# Patient Record
Sex: Female | Born: 1945
Health system: Southern US, Community
[De-identification: ages and names within clinical notes are randomized; demographics above are authoritative.]

## PROBLEM LIST (undated history)

## (undated) DIAGNOSIS — R413 Other amnesia: Secondary | ICD-10-CM

## (undated) DIAGNOSIS — E039 Hypothyroidism, unspecified: Secondary | ICD-10-CM

## (undated) DIAGNOSIS — F0390 Unspecified dementia without behavioral disturbance: Secondary | ICD-10-CM

## (undated) DIAGNOSIS — F419 Anxiety disorder, unspecified: Secondary | ICD-10-CM

## (undated) DIAGNOSIS — G8929 Other chronic pain: Secondary | ICD-10-CM

## (undated) DIAGNOSIS — L603 Nail dystrophy: Secondary | ICD-10-CM

## (undated) DIAGNOSIS — G479 Sleep disorder, unspecified: Secondary | ICD-10-CM

## (undated) HISTORY — DX: Hypothyroidism, unspecified: E03.9

## (undated) HISTORY — DX: Other chronic pain: G89.29

## (undated) HISTORY — DX: Other amnesia: R41.3

## (undated) HISTORY — DX: Nail dystrophy: L60.3

## (undated) HISTORY — DX: Sleep disorder, unspecified: G47.9

## (undated) HISTORY — PX: TONSILLECTOMY AND ADENOIDECTOMY: SUR1326

## (undated) HISTORY — DX: Unspecified dementia, unspecified severity, without behavioral disturbance, psychotic disturbance, mood disturbance, and anxiety: F03.90

## (undated) HISTORY — DX: Anxiety disorder, unspecified: F41.9

---

## 1998-12-18 ENCOUNTER — Other Ambulatory Visit: Admission: RE | Admit: 1998-12-18 | Discharge: 1998-12-18 | Payer: Self-pay | Admitting: Internal Medicine

## 1999-01-03 ENCOUNTER — Encounter: Payer: Self-pay | Admitting: Internal Medicine

## 1999-01-03 ENCOUNTER — Ambulatory Visit (HOSPITAL_COMMUNITY): Admission: RE | Admit: 1999-01-03 | Discharge: 1999-01-03 | Payer: Self-pay | Admitting: Internal Medicine

## 1999-12-19 ENCOUNTER — Other Ambulatory Visit: Admission: RE | Admit: 1999-12-19 | Discharge: 1999-12-19 | Payer: Self-pay | Admitting: Internal Medicine

## 2000-01-01 ENCOUNTER — Encounter: Payer: Self-pay | Admitting: Internal Medicine

## 2000-01-01 ENCOUNTER — Ambulatory Visit (HOSPITAL_COMMUNITY): Admission: RE | Admit: 2000-01-01 | Discharge: 2000-01-01 | Payer: Self-pay | Admitting: Internal Medicine

## 2000-01-10 ENCOUNTER — Encounter: Payer: Self-pay | Admitting: Internal Medicine

## 2000-01-10 ENCOUNTER — Ambulatory Visit (HOSPITAL_COMMUNITY): Admission: RE | Admit: 2000-01-10 | Discharge: 2000-01-10 | Payer: Self-pay | Admitting: Internal Medicine

## 2000-05-18 ENCOUNTER — Emergency Department (HOSPITAL_COMMUNITY): Admission: EM | Admit: 2000-05-18 | Discharge: 2000-05-19 | Payer: Self-pay

## 2000-07-16 ENCOUNTER — Other Ambulatory Visit: Admission: RE | Admit: 2000-07-16 | Discharge: 2000-07-16 | Payer: Self-pay | Admitting: Internal Medicine

## 2000-08-03 ENCOUNTER — Encounter: Payer: Self-pay | Admitting: Specialist

## 2000-08-03 ENCOUNTER — Encounter: Admission: RE | Admit: 2000-08-03 | Discharge: 2000-08-03 | Payer: Self-pay | Admitting: Specialist

## 2000-08-21 ENCOUNTER — Encounter (INDEPENDENT_AMBULATORY_CARE_PROVIDER_SITE_OTHER): Payer: Self-pay

## 2000-08-21 ENCOUNTER — Other Ambulatory Visit: Admission: RE | Admit: 2000-08-21 | Discharge: 2000-08-21 | Payer: Self-pay | Admitting: Obstetrics and Gynecology

## 2001-03-13 ENCOUNTER — Emergency Department (HOSPITAL_COMMUNITY): Admission: EM | Admit: 2001-03-13 | Discharge: 2001-03-14 | Payer: Self-pay | Admitting: Emergency Medicine

## 2001-03-15 ENCOUNTER — Emergency Department (HOSPITAL_COMMUNITY): Admission: EM | Admit: 2001-03-15 | Discharge: 2001-03-15 | Payer: Self-pay | Admitting: Emergency Medicine

## 2001-03-17 ENCOUNTER — Emergency Department (HOSPITAL_COMMUNITY): Admission: EM | Admit: 2001-03-17 | Discharge: 2001-03-17 | Payer: Self-pay | Admitting: Emergency Medicine

## 2003-03-02 ENCOUNTER — Emergency Department (HOSPITAL_COMMUNITY): Admission: EM | Admit: 2003-03-02 | Discharge: 2003-03-03 | Payer: Self-pay | Admitting: Emergency Medicine

## 2006-09-08 ENCOUNTER — Ambulatory Visit (HOSPITAL_COMMUNITY): Admission: RE | Admit: 2006-09-08 | Discharge: 2006-09-08 | Payer: Self-pay | Admitting: Internal Medicine

## 2008-02-24 ENCOUNTER — Encounter: Admission: RE | Admit: 2008-02-24 | Discharge: 2008-02-24 | Payer: Self-pay | Admitting: Neurology

## 2009-05-03 ENCOUNTER — Emergency Department (HOSPITAL_COMMUNITY): Admission: EM | Admit: 2009-05-03 | Discharge: 2009-05-03 | Payer: Self-pay | Admitting: Emergency Medicine

## 2010-04-08 ENCOUNTER — Encounter: Admission: RE | Admit: 2010-04-08 | Discharge: 2010-04-08 | Payer: Self-pay | Admitting: Chiropractic Medicine

## 2010-04-21 ENCOUNTER — Emergency Department (HOSPITAL_COMMUNITY): Admission: EM | Admit: 2010-04-21 | Discharge: 2010-04-21 | Payer: Self-pay | Admitting: Emergency Medicine

## 2010-12-29 LAB — DIFFERENTIAL
Basophils Absolute: 0.1 10*3/uL (ref 0.0–0.1)
Eosinophils Absolute: 0.3 10*3/uL (ref 0.0–0.7)
Eosinophils Relative: 2 % (ref 0–5)
Lymphocytes Relative: 13 % (ref 12–46)

## 2010-12-29 LAB — POCT I-STAT, CHEM 8
BUN: 13 mg/dL (ref 6–23)
Calcium, Ion: 1.12 mmol/L (ref 1.12–1.32)
Chloride: 104 mEq/L (ref 96–112)
Creatinine, Ser: 1.1 mg/dL (ref 0.4–1.2)
Glucose, Bld: 134 mg/dL — ABNORMAL HIGH (ref 70–99)
HCT: 41 % (ref 36.0–46.0)
Hemoglobin: 13.9 g/dL (ref 12.0–15.0)
TCO2: 27 mmol/L (ref 0–100)

## 2010-12-29 LAB — CBC
HCT: 38 % (ref 36.0–46.0)
MCHC: 34 g/dL (ref 30.0–36.0)
Platelets: 190 10*3/uL (ref 150–400)

## 2010-12-29 LAB — POCT CARDIAC MARKERS
CKMB, poc: 1.1 ng/mL (ref 1.0–8.0)
Myoglobin, poc: 125 ng/mL (ref 12–200)

## 2011-01-19 LAB — CBC
MCHC: 33.7 g/dL (ref 30.0–36.0)
Platelets: 165 10*3/uL (ref 150–400)
RBC: 3.92 MIL/uL (ref 3.87–5.11)
WBC: 6.8 10*3/uL (ref 4.0–10.5)

## 2011-01-19 LAB — POCT CARDIAC MARKERS
Myoglobin, poc: 47.8 ng/mL (ref 12–200)
Troponin i, poc: <0.05 ng/mL (ref 0.00–0.09)

## 2011-01-19 LAB — DIFFERENTIAL
Basophils Relative: 2 % — ABNORMAL HIGH (ref 0–1)
Eosinophils Relative: 4 % (ref 0–5)
Neutro Abs: 4.3 10*3/uL (ref 1.7–7.7)

## 2011-01-19 LAB — BASIC METABOLIC PANEL
CO2: 28 mEq/L (ref 19–32)
Creatinine, Ser: 0.58 mg/dL (ref 0.4–1.2)
GFR calc non Af Amer: >60 mL/min (ref >60)
Glucose, Bld: 81 mg/dL (ref 70–99)
Potassium: 3.8 mEq/L (ref 3.5–5.1)
Sodium: 139 mEq/L (ref 135–145)

## 2012-06-10 ENCOUNTER — Ambulatory Visit: Payer: BC Managed Care – PPO

## 2012-06-10 ENCOUNTER — Ambulatory Visit (INDEPENDENT_AMBULATORY_CARE_PROVIDER_SITE_OTHER): Payer: BC Managed Care – PPO | Admitting: Emergency Medicine

## 2012-06-10 VITALS — BP 138/84 | HR 64 | Temp 97.3°F | Resp 16 | Ht 65.0 in | Wt 184.0 lb

## 2012-06-10 DIAGNOSIS — R079 Chest pain, unspecified: Secondary | ICD-10-CM

## 2012-06-10 DIAGNOSIS — M79609 Pain in unspecified limb: Secondary | ICD-10-CM

## 2012-06-10 DIAGNOSIS — S8000XA Contusion of unspecified knee, initial encounter: Secondary | ICD-10-CM

## 2012-06-10 DIAGNOSIS — M79676 Pain in unspecified toe(s): Secondary | ICD-10-CM

## 2012-06-10 MED ORDER — HYDROCODONE-ACETAMINOPHEN 5-325 MG PO TABS: 1.0000 | ORAL_TABLET | ORAL | Status: AC | PRN

## 2012-06-10 NOTE — Progress Notes (Signed)
   Date:  06/10/2012   Name:  Darlene Ballard   DOB:  [birth year]   MRN:  [id number] Gender: female Age: 66 y.o.  PCP:  No primary provider on file.    Chief Complaint: Knee Pain, Toe Pain and Chest Pain   History of Present Illness:  Darlene Ballard is a 66 y.o. pleasant patient who presents with the following:  Patient was changing her cat litter box and turned around and slipped on a piece of newspaper on the floor.  She fell forward and landed on her right knee on the floor and her lower chest and upper abdomen landed against a metal frame.  She put on an abdominal binder and an ace bandage on her knee and went to work.  She now comes here after her work day with pain in her right knee and swelling and ecchymosis on her right anterior thigh and right knee.  She has pain in her right great toe.  Is experiencing pain in her lower chest wall inferiorly.  Some pleuritic component with pain on full inspiration.  There is no nausea, vomiting, shortness of breath.  No limitation of appetite.  No hemoptysis.  There is no problem list on file for this patient.   No past medical history on file.  No past surgical history on file.  History  Substance Use Topics  . Smoking status: Former Games developer  . Smokeless tobacco: Not on file  . Alcohol Use: Not on file    No family history on file.  No Known Allergies  Medication list has been reviewed and updated.  Current Outpatient Prescriptions on File Prior to Visit  Medication Sig Dispense Refill  . levothyroxine (SYNTHROID, LEVOTHROID) 100 MCG tablet Take 100 mcg by mouth daily.        Review of Systems:  As per HPI, otherwise negative.    Physical Examination: Filed Vitals:   06/10/12 1816  BP: 138/84  Pulse: 64  Temp: 97.3 F (36.3 C)  Resp: 16   Filed Vitals:   06/10/12 1816  Height: 5\' 5"  (1.651 m)  Weight: 184 lb (83.462 kg)   Body mass index is 30.62 kg/(m^2). Ideal Body Weight: Weight in (lb) to have BMI = 25: 149.9     GEN: WDWN, NAD, Non-toxic, A & O x 3 HEENT: Atraumatic, Normocephalic. Neck supple. No masses, No LAD. Ears and Nose: No external deformity. CV: RRR, No M/G/R. No JVD. No thrill. No extra heart sounds. PULM: CTA B, no wheezes, crackles, rhonchi. No retractions. No resp. distress. No accessory muscle use.  No crepitus or flail.  No ecchymosis ABD: S, ND, +BS. No rebound. No HSM.  No ecchymosis.  Little upper abdominal tenderness in midline EXTR: No c/c/e.  Marked tenderness right knee and moderate effusion and ecchymosis of knee.  Full passive ROM.  Right great toe tender and no deformity or ecchymosis. NEURO :  antalgit gait.  PSYCH: Normally interactive. Conversant. Not depressed or anxious appearing.  Calm demeanor.    Assessment and Plan: Chest contusion Contusion knee Ice and elevation vicodin Follow up as needed.  Carmelina Dane, MD

## 2018-08-01 NOTE — Progress Notes (Signed)
Subjective:    Patient ID: Darlene Ballard, female    DOB: 1946/04/09, 72 y.o.   MRN: [id number]  HPI:  [name] is here to establish as a new pt. She is a pleasant 72 year old female. PMH: She denies chronic medical conditions and has not been on rx's in years. However problem list reflects HLD, Hypothyroidism, Anxiety, and elevated BP She reports LLE edema the last two weeks and BP today 151/91 She denies CP/dyspnea/dizziness/HA/palpitations She estimates to drink >60 oz water/day, follows heart healthy diet She exercises at [facility] 4 times/week She abstains from ETOH/tobacco She reports extremely stressful home life r/t volatile marriage, highlights -been together >40 years -one daughter, whom lives locally "Darlene Ballard" -he is convicted felon, cocaine trafficking -he is absent long hours during daytime, will return at night to "scream at me" -he does not financially contribute to household expenses  She reports feeling safe at home She denies thoughts of harming herself/others  No care team member to display  Patient Active Problem List   Diagnosis Date Noted  . Healthcare maintenance 08/03/2018  . Anxiety 08/03/2018  . Hypertension 08/03/2018     History reviewed. No pertinent past medical history.   Past Surgical History:  Procedure Laterality Date  . TONSILLECTOMY AND ADENOIDECTOMY       Family History  Problem Relation Age of Onset  . Alzheimer's disease Mother   . Heart disease Mother   . Heart attack Mother      Social History   Substance and Sexual Activity  Drug Use Never     Social History   Substance and Sexual Activity  Alcohol Use Never  . Frequency: Never     Social History   Tobacco Use  Smoking Status Never Smoker  Smokeless Tobacco Never Used     Outpatient Encounter Medications as of 08/03/2018  Medication Sig  . Multiple Vitamin (MULTIVITAMIN) tablet Take 1 tablet by mouth daily.  . hydrochlorothiazide (HYDRODIURIL) 12.5 MG  tablet Take 1 tablet (12.5 mg total) by mouth daily.  Marland Kitchen Zoster Vaccine Adjuvanted [facility]) injection Inject 0.5 mLs into the muscle once for 1 dose.  . [DISCONTINUED] ALPRAZolam (XANAX) 0.5 MG tablet Take 0.5 mg by mouth 2 (two) times daily.  . [DISCONTINUED] levothyroxine (SYNTHROID, LEVOTHROID) 100 MCG tablet Take 100 mcg by mouth daily.   No facility-administered encounter medications on file as of 08/03/2018.     Allergies: Patient has no known allergies.  Body mass index is 29.95 kg/m.  Blood pressure (!) 151/91, pulse 81, height 5\' 5"  (1.651 m), weight 180 lb (81.6 kg), SpO2 99 %.  Review of Systems  Constitutional: Positive for fatigue. Negative for activity change, appetite change, chills, diaphoresis, fever and unexpected weight change.  HENT: Negative for congestion.   Eyes: Negative for visual disturbance.  Respiratory: Negative for cough, chest tightness, shortness of breath, wheezing and stridor.   Cardiovascular: Positive for leg swelling. Negative for chest pain and palpitations.  Gastrointestinal: Negative for abdominal distention, anal bleeding, blood in stool, constipation, diarrhea, nausea and vomiting.  Genitourinary: Negative for difficulty urinating and flank pain.  Musculoskeletal: Negative for arthralgias, back pain, gait problem, joint swelling, myalgias, neck pain and neck stiffness.  Skin: Negative for color change, pallor, rash and wound.  Neurological: Negative for dizziness and headaches.  Hematological: Does not bruise/bleed easily.  Psychiatric/Behavioral: Positive for dysphoric mood and sleep disturbance. Negative for behavioral problems, confusion, decreased concentration, hallucinations, self-injury and suicidal ideas. The patient is nervous/anxious. The patient is  not hyperactive.        Objective:   Physical Exam  Constitutional: She is oriented to person, place, and time. She appears well-developed and well-nourished. No distress.  HENT:   Head: Normocephalic and atraumatic.  Right Ear: External ear normal.  Left Ear: External ear normal.  Nose: Nose normal.  Mouth/Throat: Oropharynx is clear and moist.  Cardiovascular: Normal rate, regular rhythm, normal heart sounds and intact distal pulses.  No murmur heard. Pulmonary/Chest: Effort normal and breath sounds normal. No stridor. No respiratory distress. She has no wheezes. She has no rales. She exhibits no tenderness.  Musculoskeletal: She exhibits edema. She exhibits no tenderness.       Left ankle: She exhibits swelling. She exhibits normal range of motion, no ecchymosis and normal pulse.       Left foot: There is swelling. There is no tenderness and normal capillary refill.  Neurological: She is alert and oriented to person, place, and time.  Skin: Skin is warm and dry. Capillary refill takes less than 2 seconds. No rash noted. She is not diaphoretic. No erythema. No pallor.  Psychiatric: She has a normal mood and affect. Her behavior is normal. Judgment and thought content normal.  Nursing note and vitals reviewed.     Assessment & Plan:   1. Postmenopausal   2. Estrogen deficiency   3. Healthcare maintenance   4. Need for shingles vaccine   5. Screening for breast cancer   6. Hypertension, unspecified type   7. Anxiety     Healthcare maintenance 1) Please start once daily Hydrochlorothiazide 12.5mg  to treat lower extremity and elevated blood pressure. 2) Please check blood pressure and heart rate several times a week and bring log with you to follow-up in 2 weeks. 3) At two week follow-up, please also be fasting. 4) Behavioral Health referral placed, someone will call you to make an appt. 5) If you ever feel unsafe at home, call 911 6) If you would like dissolve your marriage, strongly urge you to contact local attorney. 7) Recommend complete physical this fall.  Anxiety CBT referral placed  Hypertension BP 151/91, HR 81 LLE edema CMP drawn Started on  HCTZ 12.5mg  QD Check BP/HR daily, bring log to f/u in 2 weeks   Pt was in the office today for 45+ minutes, I spent >75% of time in face to face counseling of various medical concerns and in coordination of care FOLLOW-UP:  Return in about 2 weeks (around 08/17/2018) for HTN, Evaluate Medication Effectiveness.

## 2018-08-03 ENCOUNTER — Encounter: Payer: Self-pay | Admitting: Adult Health

## 2018-08-03 ENCOUNTER — Ambulatory Visit (INDEPENDENT_AMBULATORY_CARE_PROVIDER_SITE_OTHER): Payer: Medicare HMO | Admitting: Adult Health

## 2018-08-03 VITALS — BP 151/91 | HR 81 | Ht 65.0 in | Wt 180.0 lb

## 2018-08-03 DIAGNOSIS — E2839 Other primary ovarian failure: Secondary | ICD-10-CM | POA: Diagnosis not present

## 2018-08-03 DIAGNOSIS — I10 Essential (primary) hypertension: Secondary | ICD-10-CM | POA: Insufficient documentation

## 2018-08-03 DIAGNOSIS — R69 Illness, unspecified: Secondary | ICD-10-CM | POA: Diagnosis not present

## 2018-08-03 DIAGNOSIS — Z0000 Encounter for general adult medical examination without abnormal findings: Secondary | ICD-10-CM | POA: Insufficient documentation

## 2018-08-03 DIAGNOSIS — Z1239 Encounter for other screening for malignant neoplasm of breast: Secondary | ICD-10-CM

## 2018-08-03 DIAGNOSIS — Z780 Asymptomatic menopausal state: Secondary | ICD-10-CM

## 2018-08-03 DIAGNOSIS — Z23 Encounter for immunization: Secondary | ICD-10-CM | POA: Diagnosis not present

## 2018-08-03 DIAGNOSIS — F419 Anxiety disorder, unspecified: Secondary | ICD-10-CM

## 2018-08-03 MED ORDER — ZOSTER VAC RECOMB ADJUVANTED 50 MCG/0.5ML IM SUSR: 0.5000 mL | Freq: Once | INTRAMUSCULAR | 0 refills | Status: AC

## 2018-08-03 MED ORDER — HYDROCHLOROTHIAZIDE 12.5 MG PO TABS: 12.5000 mg | ORAL_TABLET | Freq: Every day | ORAL | 0 refills | Status: DC

## 2018-08-03 NOTE — Patient Instructions (Addendum)
Hypertension Hypertension, commonly called high blood pressure, is when the force of blood pumping through the arteries is too strong. The arteries are the blood vessels that carry blood from the heart throughout the body. Hypertension forces the heart to work harder to pump blood and may cause arteries to become narrow or stiff. Having untreated or uncontrolled hypertension can cause heart attacks, strokes, kidney disease, and other problems. A blood pressure reading consists of a higher number over a lower number. Ideally, your blood pressure should be below 120/80. The first ("top") number is called the systolic pressure. It is a measure of the pressure in your arteries as your heart beats. The second ("bottom") number is called the diastolic pressure. It is a measure of the pressure in your arteries as the heart relaxes. What are the causes? The cause of this condition is not known. What increases the risk? Some risk factors for high blood pressure are under your control. Others are not. Factors you can change  Smoking.  Having type 2 diabetes mellitus, high cholesterol, or both.  Not getting enough exercise or physical activity.  Being overweight.  Having too much fat, sugar, calories, or salt (sodium) in your diet.  Drinking too much alcohol. Factors that are difficult or impossible to change  Having chronic kidney disease.  Having a family history of high blood pressure.  Age. Risk increases with age.  Race. You may be at higher risk if you are African-American.  Gender. Men are at higher risk than women before age 45. After age 65, women are at higher risk than men.  Having obstructive sleep apnea.  Stress. What are the signs or symptoms? Extremely high blood pressure (hypertensive crisis) may cause:  Headache.  Anxiety.  Shortness of breath.  Nosebleed.  Nausea and vomiting.  Severe chest pain.  Jerky movements you cannot control (seizures).  How is this  diagnosed? This condition is diagnosed by measuring your blood pressure while you are seated, with your arm resting on a surface. The cuff of the blood pressure monitor will be placed directly against the skin of your upper arm at the level of your heart. It should be measured at least twice using the same arm. Certain conditions can cause a difference in blood pressure between your right and left arms. Certain factors can cause blood pressure readings to be lower or higher than normal (elevated) for a short period of time:  When your blood pressure is higher when you are in a health care provider's office than when you are at home, this is called white coat hypertension. Most people with this condition do not need medicines.  When your blood pressure is higher at home than when you are in a health care provider's office, this is called masked hypertension. Most people with this condition may need medicines to control blood pressure.  If you have a high blood pressure reading during one visit or you have normal blood pressure with other risk factors:  You may be asked to return on a different day to have your blood pressure checked again.  You may be asked to monitor your blood pressure at home for 1 week or longer.  If you are diagnosed with hypertension, you may have other blood or imaging tests to help your health care provider understand your overall risk for other conditions. How is this treated? This condition is treated by making healthy lifestyle changes, such as eating healthy foods, exercising more, and reducing your alcohol intake. Your   health care provider may prescribe medicine if lifestyle changes are not enough to get your blood pressure under control, and if:  Your systolic blood pressure is above 130.  Your diastolic blood pressure is above 80.  Your personal target blood pressure may vary depending on your medical conditions, your age, and other factors. Follow these  instructions at home: Eating and drinking  Eat a diet that is high in fiber and potassium, and low in sodium, added sugar, and fat. An example eating plan is called the DASH (Dietary Approaches to Stop Hypertension) diet. To eat this way: ? Eat plenty of fresh fruits and vegetables. Try to fill half of your plate at each meal with fruits and vegetables. ? Eat whole grains, such as whole wheat pasta, brown rice, or whole grain bread. Fill about one quarter of your plate with whole grains. ? Eat or drink low-fat dairy products, such as skim milk or low-fat yogurt. ? Avoid fatty cuts of meat, processed or cured meats, and poultry with skin. Fill about one quarter of your plate with lean proteins, such as fish, chicken without skin, beans, eggs, and tofu. ? Avoid premade and processed foods. These tend to be higher in sodium, added sugar, and fat.  Reduce your daily sodium intake. Most people with hypertension should eat less than 1,500 mg of sodium a day.  Limit alcohol intake to no more than 1 drink a day for nonpregnant women and 2 drinks a day for men. One drink equals 12 oz of beer, 5 oz of wine, or 1 oz of hard liquor. Lifestyle  Work with your health care provider to maintain a healthy body weight or to lose weight. Ask what an ideal weight is for you.  Get at least 30 minutes of exercise that causes your heart to beat faster (aerobic exercise) most days of the week. Activities may include walking, swimming, or biking.  Include exercise to strengthen your muscles (resistance exercise), such as pilates or lifting weights, as part of your weekly exercise routine. Try to do these types of exercises for 30 minutes at least 3 days a week.  Do not use any products that contain nicotine or tobacco, such as cigarettes and e-cigarettes. If you need help quitting, ask your health care provider.  Monitor your blood pressure at home as told by your health care provider.  Keep all follow-up visits as  told by your health care provider. This is important. Medicines  Take over-the-counter and prescription medicines only as told by your health care provider. Follow directions carefully. Blood pressure medicines must be taken as prescribed.  Do not skip doses of blood pressure medicine. Doing this puts you at risk for problems and can make the medicine less effective.  Ask your health care provider about side effects or reactions to medicines that you should watch for. Contact a health care provider if:  You think you are having a reaction to a medicine you are taking.  You have headaches that keep coming back (recurring).  You feel dizzy.  You have swelling in your ankles.  You have trouble with your vision. Get help right away if:  You develop a severe headache or confusion.  You have unusual weakness or numbness.  You feel faint.  You have severe pain in your chest or abdomen.  You vomit repeatedly.  You have trouble breathing. Summary  Hypertension is when the force of blood pumping through your arteries is too strong. If this condition is not   controlled, it may put you at risk for serious complications.  Your personal target blood pressure may vary depending on your medical conditions, your age, and other factors. For most people, a normal blood pressure is less than 120/80.  Hypertension is treated with lifestyle changes, medicines, or a combination of both. Lifestyle changes include weight loss, eating a healthy, low-sodium diet, exercising more, and limiting alcohol. This information is not intended to replace advice given to you by your health care provider. Make sure you discuss any questions you have with your health care provider. Document Released: 09/29/2005 Document Revised: 08/27/2016 Document Reviewed: 08/27/2016 Elsevier Interactive Patient Education  2018 [place] refers to food and lifestyle choices that  are based on the traditions of countries located on the [employer]. This way of eating has been shown to help prevent certain conditions and improve outcomes for people who have chronic diseases, like kidney disease and heart disease. What are tips for following this plan? Lifestyle  Cook and eat meals together with your family, when possible.  Drink enough fluid to keep your urine clear or pale yellow.  Be physically active every day. This includes: ? Aerobic exercise like running or swimming. ? Leisure activities like gardening, walking, or housework.  Get 7-8 hours of sleep each night.  If recommended by your health care provider, drink red wine in moderation. This means 1 glass a day for nonpregnant women and 2 glasses a day for men. A glass of wine equals 5 oz (150 mL). Reading food labels  Check the serving size of packaged foods. For foods such as rice and pasta, the serving size refers to the amount of cooked product, not dry.  Check the total fat in packaged foods. Avoid foods that have saturated fat or trans fats.  Check the ingredients list for added sugars, such as corn syrup. Shopping  At the grocery store, buy most of your food from the areas near the walls of the store. This includes: ? Fresh fruits and vegetables (produce). ? Grains, beans, nuts, and seeds. Some of these may be available in unpackaged forms or large amounts (in bulk). ? Fresh seafood. ? Poultry and eggs. ? Low-fat dairy products.  Buy whole ingredients instead of prepackaged foods.  Buy fresh fruits and vegetables in-season from local farmers markets.  Buy frozen fruits and vegetables in resealable bags.  If you do not have access to quality fresh seafood, buy precooked frozen shrimp or canned fish, such as tuna, salmon, or sardines.  Buy small amounts of raw or cooked vegetables, salads, or olives from the deli or salad bar at your store.  Stock your pantry so you always have certain  foods on hand, such as olive oil, canned tuna, canned tomatoes, rice, pasta, and beans. Cooking  Cook foods with extra-virgin olive oil instead of using butter or other vegetable oils.  Have meat as a side dish, and have vegetables or grains as your main dish. This means having meat in small portions or adding small amounts of meat to foods like pasta or stew.  Use beans or vegetables instead of meat in common dishes like chili or lasagna.  Experiment with different cooking methods. Try roasting or broiling vegetables instead of steaming or sauteing them.  Add frozen vegetables to soups, stews, pasta, or rice.  Add nuts or seeds for added healthy fat at each meal. You can add these to yogurt, salads, or vegetable dishes.  Marinate  fish or vegetables using olive oil, lemon juice, garlic, and fresh herbs. Meal planning  Plan to eat 1 vegetarian meal one day each week. Try to work up to 2 vegetarian meals, if possible.  Eat seafood 2 or more times a week.  Have healthy snacks readily available, such as: ? Vegetable sticks with hummus. ? [place] yogurt. ? Fruit and nut trail mix.  Eat balanced meals throughout the week. This includes: ? Fruit: 2-3 servings a day ? Vegetables: 4-5 servings a day ? Low-fat dairy: 2 servings a day ? Fish, poultry, or lean meat: 1 serving a day ? Beans and legumes: 2 or more servings a week ? Nuts and seeds: 1-2 servings a day ? Whole grains: 6-8 servings a day ? Extra-virgin olive oil: 3-4 servings a day  Limit red meat and sweets to only a few servings a month What are my food choices?  Mediterranean diet ? Recommended ? Grains: Whole-grain pasta. Brown rice. Bulgar wheat. Polenta. Couscous. Whole-wheat bread. [place]. ? Vegetables: Artichokes. Beets. Broccoli. Cabbage. Carrots. Eggplant. Green beans. Chard. Kale. Spinach. Onions. Leeks. Peas. Squash. Tomatoes. Peppers. Radishes. ? Fruits: Apples. Apricots. Avocado. Berries. Bananas.  [place]. Dates. Figs. Grapes. Lemons. Melon. Oranges. Peaches. Plums. Pomegranate. ? Meats and other protein foods: Beans. Almonds. Sunflower seeds. Pine nuts. Peanuts. [place]. Salmon. Scallops. Shrimp. [place]. Tilapia. Clams. Oysters. Eggs. ? Dairy: Low-fat milk. Cheese. Greek yogurt. ? Beverages: Water. Red wine. Herbal tea. ? Fats and oils: Extra virgin olive oil. Avocado oil. Grape seed oil. ? Sweets and desserts: [place] yogurt with honey. Baked apples. Poached pears. Trail mix. ? Seasoning and other foods: Basil. Cilantro. Coriander. Cumin. Mint. Parsley. Sage. Rosemary. Tarragon. Garlic. Oregano. Thyme. Pepper. Balsalmic vinegar. Tahini. Hummus. Tomato sauce. Olives. Mushrooms. ? Limit these ? Grains: Prepackaged pasta or rice dishes. Prepackaged cereal with added sugar. ? Vegetables: Deep fried potatoes (french fries). ? Fruits: Fruit canned in syrup. ? Meats and other protein foods: Beef. Pork. Lamb. Poultry with skin. Hot dogs. [place] Salines. ? Dairy: Ice cream. Sour cream. Whole milk. ? Beverages: Juice. Sugar-sweetened soft drinks. Beer. Liquor and spirits. ? Fats and oils: Butter. Canola oil. Vegetable oil. Beef fat (tallow). Lard. ? Sweets and desserts: Cookies. Cakes. Pies. Candy. ? Seasoning and other foods: Mayonnaise. Premade sauces and marinades. ? The items listed may not be a complete list. Talk with your dietitian about what dietary choices are right for you. Summary  The Mediterranean diet includes both food and lifestyle choices.  Eat a variety of fresh fruits and vegetables, beans, nuts, seeds, and whole grains.  Limit the amount of red meat and sweets that you eat.  Talk with your health care provider about whether it is safe for you to drink red wine in moderation. This means 1 glass a day for nonpregnant women and 2 glasses a day for men. A glass of wine equals 5 oz (150 mL). This information is not intended to replace advice given to you by your health care provider. Make  sure you discuss any questions you have with your health care provider. Document Released: 05/22/2016 Document Revised: 06/24/2016 Document Reviewed: 05/22/2016 Elsevier Interactive Patient Education  2018 [employer].  1) Please start once daily Hydrochlorothiazide 12.5mg  to treat lower extremity and elevated blood pressure. 2) Please check blood pressure and heart rate several times a week and bring log with you to follow-up in 2 weeks. 3) At two week follow-up, please also be fasting. 4) Behavioral Health referral placed, someone will call  you to make an appt. 5) If you ever feel unsafe at home, call 911 6) If you would like dissolve your marriage, strongly urge you to contact local attorney. 7) Recommend complete physical this fall. WELCOME TO THE PRACTICE!

## 2018-08-03 NOTE — Assessment & Plan Note (Signed)
CBT referral placed. 

## 2018-08-03 NOTE — Assessment & Plan Note (Signed)
BP 151/91, HR 81 LLE edema CMP drawn Started on HCTZ 12.5mg  QD Check BP/HR daily, bring log to f/u in 2 weeks

## 2018-08-03 NOTE — Assessment & Plan Note (Signed)
1) Please start once daily Hydrochlorothiazide 12.5mg  to treat lower extremity and elevated blood pressure. 2) Please check blood pressure and heart rate several times a week and bring log with you to follow-up in 2 weeks. 3) At two week follow-up, please also be fasting. 4) Behavioral Health referral placed, someone will call you to make an appt. 5) If you ever feel unsafe at home, call 911 6) If you would like dissolve your marriage, strongly urge you to contact local attorney. 7) Recommend complete physical this fall.

## 2018-08-04 LAB — COMPREHENSIVE METABOLIC PANEL
A/G RATIO: 2 (ref 1.2–2.2)
ALT: 18 IU/L (ref 0–32)
AST: 24 IU/L (ref 0–40)
Albumin: 4.6 g/dL (ref 3.5–4.8)
Alkaline Phosphatase: 103 IU/L (ref 39–117)
BUN/Creatinine Ratio: 13 (ref 12–28)
BUN: 11 mg/dL (ref 8–27)
Bilirubin Total: 0.3 mg/dL (ref 0.0–1.2)
CALCIUM: 9.5 mg/dL (ref 8.7–10.3)
CO2: 24 mmol/L (ref 20–29)
Chloride: 102 mmol/L (ref 96–106)
Creatinine, Ser: 0.83 mg/dL (ref 0.57–1.00)
GFR, EST AFRICAN AMERICAN: 83 mL/min/{1.73_m2} (ref >59)
GFR, EST NON AFRICAN AMERICAN: 72 mL/min/{1.73_m2} (ref >59)
GLOBULIN, TOTAL: 2.3 g/dL (ref 1.5–4.5)
Glucose: 79 mg/dL (ref 65–99)
POTASSIUM: 3.6 mmol/L (ref 3.5–5.2)
SODIUM: 143 mmol/L (ref 134–144)
TOTAL PROTEIN: 6.9 g/dL (ref 6.0–8.5)

## 2018-08-17 NOTE — Progress Notes (Signed)
Subjective:    Patient ID: Darlene Ballard, female    DOB: [birth year], 72 y.o.   MRN: [id number]  HPI: 08/03/18 OV:  Darlene Ballard is here to establish as a new pt. She is a pleasant 72 year old female. PMH: She denies chronic medical conditions and has not been on rx's in years. However problem list reflects HLD, Hypothyroidism, Anxiety, and elevated BP She reports LLE edema the last two weeks and BP today 151/91 She denies CP/dyspnea/dizziness/HA/palpitations She estimates to drink >60 oz water/day, follows heart healthy diet She exercises at [facility] 4 times/week She abstains from ETOH/tobacco She reports extremely stressful home life r/t volatile marriage, highlights -been together >40 years -one daughter, whom lives locally "Darlene Ballard" -he is convicted felon, cocaine trafficking -he is absent long hours during daytime, will return at night to "scream at me" -he does not financially contribute to household expenses  She reports feeling safe at home She denies thoughts of harming herself/others  08/18/18 OV: Darlene Ballard is here for f/u:htn and edema  She reports taking only 1 tablet of HCTZ 12.5mg  and states "the pharmacy only gave me one tablet". Pharmacy called and confirmed that 30 count rx was filled and provided to pt Another rx sent in, pt instructed to take once daily and return in 2 weeks to evaluate medication effectiveness She continues to drink plenty of water and follow heart healthy diet I am concerned with her memory/recall, she denies known neurological disorders, however she did report her mother had dementia and alzheimer's She did not return fasting this morning, will obtain labs at next f/u OV   Patient Care Team    Relationship Specialty Notifications Start End  Esaw Grandchild, NP PCP - General Family Medicine  08/03/18     Patient Active Problem List   Diagnosis Date Noted  . Memory deficit 08/18/2018  . Healthcare maintenance 08/03/2018  . Anxiety 08/03/2018  .  Hypertension 08/03/2018     History reviewed. No pertinent past medical history.   Past Surgical History:  Procedure Laterality Date  . TONSILLECTOMY AND ADENOIDECTOMY       Family History  Problem Relation Age of Onset  . Alzheimer's disease Mother   . Heart disease Mother   . Heart attack Mother      Social History   Substance and Sexual Activity  Drug Use Never     Social History   Substance and Sexual Activity  Alcohol Use Never  . Frequency: Never     Social History   Tobacco Use  Smoking Status Never Smoker  Smokeless Tobacco Never Used     Outpatient Encounter Medications as of 08/18/2018  Medication Sig  . Multiple Vitamin (MULTIVITAMIN) tablet Take 1 tablet by mouth daily.  . hydrochlorothiazide (HYDRODIURIL) 12.5 MG tablet Take 1 tablet (12.5 mg total) by mouth daily.  . [DISCONTINUED] hydrochlorothiazide (HYDRODIURIL) 12.5 MG tablet Take 1 tablet (12.5 mg total) by mouth daily.   No facility-administered encounter medications on file as of 08/18/2018.     Allergies: Patient has no known allergies.  Body mass index is 30.39 kg/m.  Blood pressure (!) 159/92, pulse 73, temperature 98 F (36.7 C), temperature source Oral, height 5\' 5"  (1.651 m), weight 182 lb 9.6 oz (82.8 kg), SpO2 99 %.  Review of Systems  Constitutional: Positive for fatigue. Negative for activity change, appetite change, chills, diaphoresis, fever and unexpected weight change.  HENT: Negative for congestion.   Eyes: Negative for visual disturbance.  Respiratory: Negative  for cough, chest tightness, shortness of breath, wheezing and stridor.   Cardiovascular: Positive for leg swelling. Negative for chest pain and palpitations.  Gastrointestinal: Negative for abdominal distention, anal bleeding, blood in stool, constipation, diarrhea, nausea and vomiting.  Genitourinary: Negative for difficulty urinating and flank pain.  Musculoskeletal: Negative for arthralgias, back pain,  gait problem, joint swelling, myalgias, neck pain and neck stiffness.  Skin: Negative for color change, pallor, rash and wound.  Neurological: Negative for dizziness and headaches.  Hematological: Does not bruise/bleed easily.  Psychiatric/Behavioral: Positive for dysphoric mood and sleep disturbance. Negative for behavioral problems, confusion, decreased concentration, hallucinations, self-injury and suicidal ideas. The patient is nervous/anxious. The patient is not hyperactive.        Objective:   Physical Exam  Constitutional: She is oriented to person, place, and time. She appears well-developed and well-nourished. No distress.  HENT:  Head: Normocephalic and atraumatic.  Right Ear: External ear normal.  Left Ear: External ear normal.  Nose: Nose normal.  Mouth/Throat: Oropharynx is clear and moist.  Cardiovascular: Normal rate, regular rhythm, normal heart sounds and intact distal pulses.  No murmur heard. Pulmonary/Chest: Effort normal and breath sounds normal. No stridor. No respiratory distress. She has no wheezes. She has no rales. She exhibits no tenderness.  Musculoskeletal: She exhibits edema. She exhibits no tenderness.       Left ankle: She exhibits swelling. She exhibits normal range of motion, no ecchymosis and normal pulse.       Left foot: There is swelling. There is no tenderness and normal capillary refill.  Neurological: She is alert and oriented to person, place, and time.  Skin: Skin is warm and dry. Capillary refill takes less than 2 seconds. No rash noted. She is not diaphoretic. No erythema. No pallor.  Psychiatric: She has a normal mood and affect. Her behavior is normal. Judgment and thought content normal.  Nursing note and vitals reviewed.     Assessment & Plan:   1. Healthcare maintenance   2. Hypertension, unspecified type   3. Anxiety   4. Memory deficit     Healthcare maintenance Another prescription sent in for Hydrochlorothiazide 12.5mg  has  been sent in, please take on tablet daily. Please return in 2 weeks to assess medication effectiveness, blood pressure, and lower extremity edema. Come fasting to next appt. Continue to drink plenty and follow heart healthy diet. 09/01/18 you have behavioral health appt already scheduled, please keep!  Hypertension She has not been on HCTZ, new rx sent in BP 159/92, HR 73 Lower ext edema still present  Anxiety  09/01/18 you have behavioral health appt already scheduled, please keep!  Memory deficit Will perform MMSE at next OV She reports mother had dementia and alzheimer's   Pt was in the office today for 25+ minutes, I spent >75% of time in face to face counseling of various medical concerns and in coordination of care FOLLOW-UP:  Return in about 2 weeks (around 09/01/2018) for Regular Follow Up, HTN.

## 2018-08-18 ENCOUNTER — Ambulatory Visit (INDEPENDENT_AMBULATORY_CARE_PROVIDER_SITE_OTHER): Payer: Medicare HMO | Admitting: Adult Health

## 2018-08-18 ENCOUNTER — Encounter: Payer: Self-pay | Admitting: Adult Health

## 2018-08-18 VITALS — BP 131/79 | HR 73 | Temp 98.0°F | Ht 65.0 in | Wt 182.6 lb

## 2018-08-18 DIAGNOSIS — I10 Essential (primary) hypertension: Secondary | ICD-10-CM

## 2018-08-18 DIAGNOSIS — R413 Other amnesia: Secondary | ICD-10-CM | POA: Diagnosis not present

## 2018-08-18 DIAGNOSIS — R69 Illness, unspecified: Secondary | ICD-10-CM | POA: Diagnosis not present

## 2018-08-18 DIAGNOSIS — Z0000 Encounter for general adult medical examination without abnormal findings: Secondary | ICD-10-CM | POA: Diagnosis not present

## 2018-08-18 DIAGNOSIS — F419 Anxiety disorder, unspecified: Secondary | ICD-10-CM

## 2018-08-18 MED ORDER — HYDROCHLOROTHIAZIDE 12.5 MG PO TABS: 12.5000 mg | ORAL_TABLET | Freq: Every day | ORAL | 0 refills | Status: DC

## 2018-08-18 NOTE — Patient Instructions (Addendum)
Hypertension Hypertension, commonly called high blood pressure, is when the force of blood pumping through the arteries is too strong. The arteries are the blood vessels that carry blood from the heart throughout the body. Hypertension forces the heart to work harder to pump blood and may cause arteries to become narrow or stiff. Having untreated or uncontrolled hypertension can cause heart attacks, strokes, kidney disease, and other problems. A blood pressure reading consists of a higher number over a lower number. Ideally, your blood pressure should be below 120/80. The first ("top") number is called the systolic pressure. It is a measure of the pressure in your arteries as your heart beats. The second ("bottom") number is called the diastolic pressure. It is a measure of the pressure in your arteries as the heart relaxes. What are the causes? The cause of this condition is not known. What increases the risk? Some risk factors for high blood pressure are under your control. Others are not. Factors you can change  Smoking.  Having type 2 diabetes mellitus, high cholesterol, or both.  Not getting enough exercise or physical activity.  Being overweight.  Having too much fat, sugar, calories, or salt (sodium) in your diet.  Drinking too much alcohol. Factors that are difficult or impossible to change  Having chronic kidney disease.  Having a family history of high blood pressure.  Age. Risk increases with age.  Race. You may be at higher risk if you are African-American.  Gender. Men are at higher risk than women before age 45. After age 65, women are at higher risk than men.  Having obstructive sleep apnea.  Stress. What are the signs or symptoms? Extremely high blood pressure (hypertensive crisis) may cause:  Headache.  Anxiety.  Shortness of breath.  Nosebleed.  Nausea and vomiting.  Severe chest pain.  Jerky movements you cannot control (seizures).  How is this  diagnosed? This condition is diagnosed by measuring your blood pressure while you are seated, with your arm resting on a surface. The cuff of the blood pressure monitor will be placed directly against the skin of your upper arm at the level of your heart. It should be measured at least twice using the same arm. Certain conditions can cause a difference in blood pressure between your right and left arms. Certain factors can cause blood pressure readings to be lower or higher than normal (elevated) for a short period of time:  When your blood pressure is higher when you are in a health care provider's office than when you are at home, this is called white coat hypertension. Most people with this condition do not need medicines.  When your blood pressure is higher at home than when you are in a health care provider's office, this is called masked hypertension. Most people with this condition may need medicines to control blood pressure.  If you have a high blood pressure reading during one visit or you have normal blood pressure with other risk factors:  You may be asked to return on a different day to have your blood pressure checked again.  You may be asked to monitor your blood pressure at home for 1 week or longer.  If you are diagnosed with hypertension, you may have other blood or imaging tests to help your health care provider understand your overall risk for other conditions. How is this treated? This condition is treated by making healthy lifestyle changes, such as eating healthy foods, exercising more, and reducing your alcohol intake. Your   health care provider may prescribe medicine if lifestyle changes are not enough to get your blood pressure under control, and if:  Your systolic blood pressure is above 130.  Your diastolic blood pressure is above 80.  Your personal target blood pressure may vary depending on your medical conditions, your age, and other factors. Follow these  instructions at home: Eating and drinking  Eat a diet that is high in fiber and potassium, and low in sodium, added sugar, and fat. An example eating plan is called the DASH (Dietary Approaches to Stop Hypertension) diet. To eat this way: ? Eat plenty of fresh fruits and vegetables. Try to fill half of your plate at each meal with fruits and vegetables. ? Eat whole grains, such as whole wheat pasta, brown rice, or whole grain bread. Fill about one quarter of your plate with whole grains. ? Eat or drink low-fat dairy products, such as skim milk or low-fat yogurt. ? Avoid fatty cuts of meat, processed or cured meats, and poultry with skin. Fill about one quarter of your plate with lean proteins, such as fish, chicken without skin, beans, eggs, and tofu. ? Avoid premade and processed foods. These tend to be higher in sodium, added sugar, and fat.  Reduce your daily sodium intake. Most people with hypertension should eat less than 1,500 mg of sodium a day.  Limit alcohol intake to no more than 1 drink a day for nonpregnant women and 2 drinks a day for men. One drink equals 12 oz of beer, 5 oz of wine, or 1 oz of hard liquor. Lifestyle  Work with your health care provider to maintain a healthy body weight or to lose weight. Ask what an ideal weight is for you.  Get at least 30 minutes of exercise that causes your heart to beat faster (aerobic exercise) most days of the week. Activities may include walking, swimming, or biking.  Include exercise to strengthen your muscles (resistance exercise), such as pilates or lifting weights, as part of your weekly exercise routine. Try to do these types of exercises for 30 minutes at least 3 days a week.  Do not use any products that contain nicotine or tobacco, such as cigarettes and e-cigarettes. If you need help quitting, ask your health care provider.  Monitor your blood pressure at home as told by your health care provider.  Keep all follow-up visits as  told by your health care provider. This is important. Medicines  Take over-the-counter and prescription medicines only as told by your health care provider. Follow directions carefully. Blood pressure medicines must be taken as prescribed.  Do not skip doses of blood pressure medicine. Doing this puts you at risk for problems and can make the medicine less effective.  Ask your health care provider about side effects or reactions to medicines that you should watch for. Contact a health care provider if:  You think you are having a reaction to a medicine you are taking.  You have headaches that keep coming back (recurring).  You feel dizzy.  You have swelling in your ankles.  You have trouble with your vision. Get help right away if:  You develop a severe headache or confusion.  You have unusual weakness or numbness.  You feel faint.  You have severe pain in your chest or abdomen.  You vomit repeatedly.  You have trouble breathing. Summary  Hypertension is when the force of blood pumping through your arteries is too strong. If this condition is not   controlled, it may put you at risk for serious complications.  Your personal target blood pressure may vary depending on your medical conditions, your age, and other factors. For most people, a normal blood pressure is less than 120/80.  Hypertension is treated with lifestyle changes, medicines, or a combination of both. Lifestyle changes include weight loss, eating a healthy, low-sodium diet, exercising more, and limiting alcohol. This information is not intended to replace advice given to you by your health care provider. Make sure you discuss any questions you have with your health care provider. Document Released: 09/29/2005 Document Revised: 08/27/2016 Document Reviewed: 08/27/2016 Elsevier Interactive Patient Education  2018 [employer].   Edema Edema is an abnormal buildup of fluids in your bodytissues. Edema is  somewhatdependent on gravity to pull the fluid to the lowest place in your body. That makes the condition more common in the legs and thighs (lower extremities). Painless swelling of the feet and ankles is common and becomes more likely as you get older. It is also common in looser tissues, like around your eyes. When the affected area is squeezed, the fluid may move out of that spot and leave a dent for a few moments. This dent is called pitting. What are the causes? There are many possible causes of edema. Eating too much salt and being on your feet or sitting for a long time can cause edema in your legs and ankles. Hot weather may make edema worse. Common medical causes of edema include:  Heart failure.  Liver disease.  Kidney disease.  Weak blood vessels in your legs.  Cancer.  An injury.  Pregnancy.  Some medications.  Obesity.  What are the signs or symptoms? Edema is usually painless.Your skin may look swollen or shiny. How is this diagnosed? Your health care provider may be able to diagnose edema by asking about your medical history and doing a physical exam. You may need to have tests such as X-rays, an electrocardiogram, or blood tests to check for medical conditions that may cause edema. How is this treated? Edema treatment depends on the cause. If you have heart, liver, or kidney disease, you need the treatment appropriate for these conditions. General treatment may include:  Elevation of the affected body part above the level of your heart.  Compression of the affected body part. Pressure from elastic bandages or support stockings squeezes the tissues and forces fluid back into the blood vessels. This keeps fluid from entering the tissues.  Restriction of fluid and salt intake.  Use of a water pill (diuretic). These medications are appropriate only for some types of edema. They pull fluid out of your body and make you urinate more often. This gets rid of fluid and  reduces swelling, but diuretics can have side effects. Only use diuretics as directed by your health care provider.  Follow these instructions at home:  Keep the affected body part above the level of your heart when you are lying down.  Do not sit still or stand for prolonged periods.  Do not put anything directly under your knees when lying down.  Do not wear constricting clothing or garters on your upper legs.  Exercise your legs to work the fluid back into your blood vessels. This may help the swelling go down.  Wear elastic bandages or support stockings to reduce ankle swelling as directed by your health care provider.  Eat a low-salt diet to reduce fluid if your health care provider recommends it.  Only take  medicines as directed by your health care provider. Contact a health care provider if:  Your edema is not responding to treatment.  You have heart, liver, or kidney disease and notice symptoms of edema.  You have edema in your legs that does not improve after elevating them.  You have sudden and unexplained weight gain. Get help right away if:  You develop shortness of breath or chest pain.  You cannot breathe when you lie down.  You develop pain, redness, or warmth in the swollen areas.  You have heart, liver, or kidney disease and suddenly get edema.  You have a fever and your symptoms suddenly get worse. This information is not intended to replace advice given to you by your health care provider. Make sure you discuss any questions you have with your health care provider. Document Released: 09/29/2005 Document Revised: 03/06/2016 Document Reviewed: 07/22/2013 Elsevier Interactive Patient Education  2017 [employer].  Another prescription sent in for Hydrochlorothiazide 12.5mg  has been sent in, please take on tablet daily. Please return in 2 weeks to assess medication effectiveness, blood pressure, and lower extremity edema. Come fasting to next  appt. Continue to drink plenty and follow heart healthy diet. 09/01/18 you have behavioral health appt already scheduled, please keep! Please call clinic with any questions/concerns.

## 2018-08-18 NOTE — Assessment & Plan Note (Signed)
She has not been on HCTZ, new rx sent in BP 159/92, HR 73 Lower ext edema still present

## 2018-08-18 NOTE — Assessment & Plan Note (Signed)
  09/01/18 you have behavioral health appt already scheduled, please keep!

## 2018-08-18 NOTE — Assessment & Plan Note (Signed)
Will perform MMSE at next OV She reports mother had dementia and alzheimer's

## 2018-08-18 NOTE — Assessment & Plan Note (Signed)
Another prescription sent in for Hydrochlorothiazide 12.5mg  has been sent in, please take on tablet daily. Please return in 2 weeks to assess medication effectiveness, blood pressure, and lower extremity edema. Come fasting to next appt. Continue to drink plenty and follow heart healthy diet. 09/01/18 you have behavioral health appt already scheduled, please keep!
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
## 2018-09-01 ENCOUNTER — Ambulatory Visit: Payer: Medicare HMO | Admitting: Psychology

## 2018-09-01 ENCOUNTER — Encounter: Payer: Self-pay | Admitting: Adult Health

## 2018-09-01 ENCOUNTER — Ambulatory Visit (INDEPENDENT_AMBULATORY_CARE_PROVIDER_SITE_OTHER): Payer: Medicare HMO | Admitting: Adult Health

## 2018-09-01 VITALS — BP 121/78 | HR 68 | Temp 97.6°F | Ht 65.0 in | Wt 180.6 lb

## 2018-09-01 DIAGNOSIS — I10 Essential (primary) hypertension: Secondary | ICD-10-CM | POA: Diagnosis not present

## 2018-09-01 DIAGNOSIS — R413 Other amnesia: Secondary | ICD-10-CM | POA: Diagnosis not present

## 2018-09-01 DIAGNOSIS — Z0000 Encounter for general adult medical examination without abnormal findings: Secondary | ICD-10-CM

## 2018-09-01 DIAGNOSIS — Z23 Encounter for immunization: Secondary | ICD-10-CM

## 2018-09-01 MED ORDER — ZOSTER VAC RECOMB ADJUVANTED 50 MCG/0.5ML IM SUSR: 0.5000 mL | Freq: Once | INTRAMUSCULAR | 0 refills | Status: AC

## 2018-09-01 MED ORDER — HYDROCHLOROTHIAZIDE 12.5 MG PO TABS: 12.5000 mg | ORAL_TABLET | Freq: Every day | ORAL | 3 refills | Status: DC

## 2018-09-01 NOTE — Assessment & Plan Note (Signed)
MMSE score 73 Mother had dementia Referral to Neurology placed

## 2018-09-01 NOTE — Assessment & Plan Note (Signed)
Continue once daily Hydrochlorothiazide 12.5mg  Continue regular exercise. Drink plenty of water and follow heart healthy diet. Neurology referral placed. Follow-up 3 months, fasting labs will be obtained then as well.

## 2018-09-01 NOTE — Assessment & Plan Note (Signed)
BP at goal 121/78, HR 68 Lower ext edema resolved Cont HCTZ 12.5mg  QD Cont regular exercise

## 2018-09-01 NOTE — Patient Instructions (Addendum)

## 2018-10-12 ENCOUNTER — Encounter: Payer: Self-pay | Admitting: Adult Health

## 2018-10-21 ENCOUNTER — Telehealth: Payer: Self-pay | Admitting: Adult Health

## 2018-10-21 DIAGNOSIS — R69 Illness, unspecified: Secondary | ICD-10-CM | POA: Diagnosis not present

## 2018-10-21 NOTE — Telephone Encounter (Signed)
Noted.  T. Saleem Coccia, CMA 

## 2018-10-21 NOTE — Telephone Encounter (Signed)
Patient called states she was told to get (3) vaccines, she did get 2 of them :   Flu & Prevnar but can not afford the Shrinrix vaccine.  --Forwarding message to medical assistant --Advised pt to contact [employer] to see if it is covered & can be given to her @ pharmacy or in doctor's office.  --glh

## 2018-11-09 ENCOUNTER — Ambulatory Visit: Payer: Medicare HMO | Admitting: Neurology

## 2018-11-09 ENCOUNTER — Telehealth: Payer: Self-pay | Admitting: *Deleted

## 2018-11-09 NOTE — Telephone Encounter (Signed)
No showed new patient appointment. 

## 2018-11-10 ENCOUNTER — Encounter: Payer: Self-pay | Admitting: Neurology
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
## 2018-12-02 ENCOUNTER — Ambulatory Visit: Payer: Self-pay | Admitting: Adult Health

## 2018-12-03 ENCOUNTER — Ambulatory Visit: Payer: Medicare HMO | Admitting: Adult Health
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
## 2018-12-15 ENCOUNTER — Encounter: Payer: Self-pay | Admitting: Adult Health

## 2018-12-15 ENCOUNTER — Ambulatory Visit (INDEPENDENT_AMBULATORY_CARE_PROVIDER_SITE_OTHER): Payer: Medicare HMO | Admitting: Adult Health

## 2018-12-15 VITALS — BP 132/78 | HR 67 | Temp 97.7°F | Ht 65.0 in | Wt 185.6 lb

## 2018-12-15 DIAGNOSIS — Z0000 Encounter for general adult medical examination without abnormal findings: Secondary | ICD-10-CM

## 2018-12-15 DIAGNOSIS — I10 Essential (primary) hypertension: Secondary | ICD-10-CM

## 2018-12-15 DIAGNOSIS — R413 Other amnesia: Secondary | ICD-10-CM | POA: Diagnosis not present

## 2018-12-15 DIAGNOSIS — R7989 Other specified abnormal findings of blood chemistry: Secondary | ICD-10-CM | POA: Diagnosis not present

## 2018-12-15 DIAGNOSIS — E785 Hyperlipidemia, unspecified: Secondary | ICD-10-CM | POA: Diagnosis not present

## 2018-12-15 MED ORDER — CHLORTHALIDONE 25 MG PO TABS: 25.0000 mg | ORAL_TABLET | Freq: Every day | ORAL | 3 refills | Status: DC

## 2018-12-15 NOTE — Assessment & Plan Note (Signed)
New referral to Neurology placed, IMPERATIVE that you keep your appt. Please follow-up in 2 weeks, re: blood pressure check and to discuss fasting labs. If you ever feel unsafe at home- please call 911

## 2018-12-15 NOTE — Patient Instructions (Addendum)
Managing Your Hypertension Hypertension is commonly called high blood pressure. This is when the force of your blood pressing against the walls of your arteries is too strong. Arteries are blood vessels that carry blood from your heart throughout your body. Hypertension forces the heart to work harder to pump blood, and may cause the arteries to become narrow or stiff. Having untreated or uncontrolled hypertension can cause heart attack, stroke, kidney disease, and other problems. What are blood pressure readings? A blood pressure reading consists of a higher number over a lower number. Ideally, your blood pressure should be below 120/80. The first ("top") number is called the systolic pressure. It is a measure of the pressure in your arteries as your heart beats. The second ("bottom") number is called the diastolic pressure. It is a measure of the pressure in your arteries as the heart relaxes. What does my blood pressure reading mean? Blood pressure is classified into four stages. Based on your blood pressure reading, your health care provider may use the following stages to determine what type of treatment you need, if any. Systolic pressure and diastolic pressure are measured in a unit called mm Hg. Normal  Systolic pressure: below 120.  Diastolic pressure: below 80. Elevated  Systolic pressure: 120-129.  Diastolic pressure: below 80. Hypertension stage 1  Systolic pressure: 130-139.  Diastolic pressure: 80-89. Hypertension stage 2  Systolic pressure: 140 or above.  Diastolic pressure: 90 or above. What health risks are associated with hypertension? Managing your hypertension is an important responsibility. Uncontrolled hypertension can lead to:  A heart attack.  A stroke.  A weakened blood vessel (aneurysm).  Heart failure.  Kidney damage.  Eye damage.  Metabolic syndrome.  Memory and concentration problems. What changes can I make to manage my  hypertension? Hypertension can be managed by making lifestyle changes and possibly by taking medicines. Your health care provider will help you make a plan to bring your blood pressure within a normal range. Eating and drinking   Eat a diet that is high in fiber and potassium, and low in salt (sodium), added sugar, and fat. An example eating plan is called the DASH (Dietary Approaches to Stop Hypertension) diet. To eat this way: ? Eat plenty of fresh fruits and vegetables. Try to fill half of your plate at each meal with fruits and vegetables. ? Eat whole grains, such as whole wheat pasta, brown rice, or whole grain bread. Fill about one quarter of your plate with whole grains. ? Eat low-fat diary products. ? Avoid fatty cuts of meat, processed or cured meats, and poultry with skin. Fill about one quarter of your plate with lean proteins such as fish, chicken without skin, beans, eggs, and tofu. ? Avoid premade and processed foods. These tend to be higher in sodium, added sugar, and fat.  Reduce your daily sodium intake. Most people with hypertension should eat less than 1,500 mg of sodium a day.  Limit alcohol intake to no more than 1 drink a day for nonpregnant women and 2 drinks a day for men. One drink equals 12 oz of beer, 5 oz of wine, or 1 oz of hard liquor. Lifestyle  Work with your health care provider to maintain a healthy body weight, or to lose weight. Ask what an ideal weight is for you.  Get at least 30 minutes of exercise that causes your heart to beat faster (aerobic exercise) most days of the week. Activities may include walking, swimming, or biking.  Include exercise   to strengthen your muscles (resistance exercise), such as weight lifting, as part of your weekly exercise routine. Try to do these types of exercises for 30 minutes at least 3 days a week.  Do not use any products that contain nicotine or tobacco, such as cigarettes and e-cigarettes. If you need help quitting,  ask your health care provider.  Control any long-term (chronic) conditions you have, such as high cholesterol or diabetes. Monitoring  Monitor your blood pressure at home as told by your health care provider. Your personal target blood pressure may vary depending on your medical conditions, your age, and other factors.  Have your blood pressure checked regularly, as often as told by your health care provider. Working with your health care provider  Review all the medicines you take with your health care provider because there may be side effects or interactions.  Talk with your health care provider about your diet, exercise habits, and other lifestyle factors that may be contributing to hypertension.  Visit your health care provider regularly. Your health care provider can help you create and adjust your plan for managing hypertension. Will I need medicine to control my blood pressure? Your health care provider may prescribe medicine if lifestyle changes are not enough to get your blood pressure under control, and if:  Your systolic blood pressure is 130 or higher.  Your diastolic blood pressure is 80 or higher. Take medicines only as told by your health care provider. Follow the directions carefully. Blood pressure medicines must be taken as prescribed. The medicine does not work as well when you skip doses. Skipping doses also puts you at risk for problems. Contact a health care provider if:  You think you are having a reaction to medicines you have taken.  You have repeated (recurrent) headaches.  You feel dizzy.  You have swelling in your ankles.  You have trouble with your vision. Get help right away if:  You develop a severe headache or confusion.  You have unusual weakness or numbness, or you feel faint.  You have severe pain in your chest or abdomen.  You vomit repeatedly.  You have trouble breathing. Summary  Hypertension is when the force of blood pumping  through your arteries is too strong. If this condition is not controlled, it may put you at risk for serious complications.  Your personal target blood pressure may vary depending on your medical conditions, your age, and other factors. For most people, a normal blood pressure is less than 120/80.  Hypertension is managed by lifestyle changes, medicines, or both. Lifestyle changes include weight loss, eating a healthy, low-sodium diet, exercising more, and limiting alcohol. This information is not intended to replace advice given to you by your health care provider. Make sure you discuss any questions you have with your health care provider. Document Released: 06/23/2012 Document Revised: 08/27/2016 Document Reviewed: 08/27/2016 Elsevier Interactive Patient Education  2019 [employer].  Please take Chlorthalidone (Hygroton) 25mg  once daily Pharmacy will provide you 30 tablets per month, if you have any questions about this- please call us. Remain well hydrated and follow heart healthy diet. New referral to Neurology placed, IMPERATIVE that you keep your appt. Please follow-up in 2 weeks, re: blood pressure check and to discuss fasting labs. If you ever feel unsafe at home- please call 911 Please check blood pressure and heart rate daily, bring log with you at follow-up. NICE TO SEE YOU!

## 2018-12-15 NOTE — Assessment & Plan Note (Signed)
08/2018 MMSE score 22 Today MMSe score 19 Another referral to Neurology placed and stressed importance of keeping appt

## 2018-12-15 NOTE — Assessment & Plan Note (Signed)
Please take Chlorthalidone (Hygroton) 25mg  once daily Pharmacy will provide you 30 tablets per month, if you have any questions about this- please call us. Remain well hydrated and follow heart healthy diet. New referral to Neurology placed, IMPERATIVE that you keep your appt. Please follow-up in 2 weeks, re: blood pressure check and to discuss fasting labs. If you ever feel unsafe at home- please call 911 Please check blood pressure and heart rate daily, bring log with you at follow-up.

## 2018-12-16 LAB — CBC WITH DIFFERENTIAL/PLATELET
BASOS: 1 %
Basophils Absolute: 0.1 10*3/uL (ref 0.0–0.2)
EOS (ABSOLUTE): 0.2 10*3/uL (ref 0.0–0.4)
EOS: 2 %
HEMATOCRIT: 38.7 % (ref 34.0–46.6)
HEMOGLOBIN: 13.1 g/dL (ref 11.1–15.9)
IMMATURE GRANULOCYTES: 0 %
Immature Grans (Abs): 0 10*3/uL (ref 0.0–0.1)
LYMPHS ABS: 1.5 10*3/uL (ref 0.7–3.1)
Lymphs: 20 %
MCH: 31.6 pg (ref 26.6–33.0)
MCHC: 33.9 g/dL (ref 31.5–35.7)
MCV: 94 fL (ref 79–97)
MONOCYTES: 6 %
Monocytes Absolute: 0.5 10*3/uL (ref 0.1–0.9)
Neutrophils Absolute: 5.3 10*3/uL (ref 1.4–7.0)
Neutrophils: 71 %
Platelets: 203 10*3/uL (ref 150–450)
RBC: 4.14 x10E6/uL (ref 3.77–5.28)
RDW: 12.5 % (ref 11.7–15.4)
WBC: 7.5 10*3/uL (ref 3.4–10.8)

## 2018-12-16 LAB — LIPID PANEL
CHOLESTEROL TOTAL: 208 mg/dL — AB (ref 100–199)
Chol/HDL Ratio: 2.8 ratio (ref 0.0–4.4)
HDL: 74 mg/dL (ref >39)
LDL CALC: 113 mg/dL — AB (ref 0–99)
TRIGLYCERIDES: 107 mg/dL (ref 0–149)
VLDL Cholesterol Cal: 21 mg/dL (ref 5–40)

## 2018-12-16 LAB — COMPREHENSIVE METABOLIC PANEL
ALBUMIN: 4.3 g/dL (ref 3.8–4.8)
ALK PHOS: 95 IU/L (ref 39–117)
ALT: 12 IU/L (ref 0–32)
AST: 21 IU/L (ref 0–40)
Albumin/Globulin Ratio: 1.8 (ref 1.2–2.2)
BILIRUBIN TOTAL: 0.3 mg/dL (ref 0.0–1.2)
BUN/Creatinine Ratio: 16 (ref 12–28)
BUN: 11 mg/dL (ref 8–27)
CO2: 26 mmol/L (ref 20–29)
Calcium: 9.2 mg/dL (ref 8.7–10.3)
Chloride: 102 mmol/L (ref 96–106)
Creatinine, Ser: 0.7 mg/dL (ref 0.57–1.00)
GFR calc non Af Amer: 88 mL/min/{1.73_m2} (ref >59)
GFR, EST AFRICAN AMERICAN: 102 mL/min/{1.73_m2} (ref >59)
GLUCOSE: 90 mg/dL (ref 65–99)
Globulin, Total: 2.4 g/dL (ref 1.5–4.5)
POTASSIUM: 4.4 mmol/L (ref 3.5–5.2)
Sodium: 141 mmol/L (ref 134–144)
TOTAL PROTEIN: 6.7 g/dL (ref 6.0–8.5)

## 2018-12-16 LAB — HEMOGLOBIN A1C
Est. average glucose Bld gHb Est-mCnc: 105 mg/dL
Hgb A1c MFr Bld: 5.3 % (ref 4.8–5.6)

## 2018-12-16 LAB — TSH: TSH: 6.24 u[IU]/mL — ABNORMAL HIGH (ref 0.450–4.500)
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
## 2018-12-29 ENCOUNTER — Ambulatory Visit: Payer: Medicare HMO | Admitting: Adult Health

## 2018-12-30 ENCOUNTER — Ambulatory Visit: Payer: Medicare HMO | Admitting: Adult Health

## 2019-02-01 DIAGNOSIS — E785 Hyperlipidemia, unspecified: Secondary | ICD-10-CM | POA: Insufficient documentation

## 2019-02-01 DIAGNOSIS — R7989 Other specified abnormal findings of blood chemistry: Secondary | ICD-10-CM | POA: Insufficient documentation

## 2019-04-08 ENCOUNTER — Other Ambulatory Visit: Payer: Self-pay | Admitting: Adult Health

## 2019-04-11 ENCOUNTER — Telehealth: Payer: Self-pay

## 2019-04-11 NOTE — Telephone Encounter (Signed)
Please call pt to schedule telemedicine visit for BP f/u.  She was to have had a f/u 12/29/2018.  Charyl Bigger, CMA

## 2019-04-26 ENCOUNTER — Telehealth: Payer: Self-pay | Admitting: Adult Health

## 2019-04-26 NOTE — Telephone Encounter (Signed)
Left patient message to contact office to set up provider required Telehealth or OV .  --Forwarding FYI to medical assistant.  --Dion Body

## 2019-05-05 ENCOUNTER — Other Ambulatory Visit: Payer: Self-pay | Admitting: Adult Health

## 2019-05-25 ENCOUNTER — Other Ambulatory Visit: Payer: Self-pay | Admitting: Adult Health

## 2019-05-25 ENCOUNTER — Telehealth: Payer: Self-pay

## 2019-05-25 NOTE — Telephone Encounter (Signed)
Please call pt to schedule f/u with [facility].  Refill request received today denied.  Charyl Bigger, CMA

## 2019-05-30 ENCOUNTER — Telehealth: Payer: Self-pay | Admitting: Adult Health

## 2019-05-30 NOTE — Telephone Encounter (Signed)
Rcvd msg from CMA to set pt up for OV/Telehealth for future Rx refills---called pt left msg (no answer).  ---Remus Loffler to medical assistant.  --Dion Body

## 2019-06-25 ENCOUNTER — Other Ambulatory Visit: Payer: Self-pay | Admitting: Adult Health

## 2019-08-09 ENCOUNTER — Encounter: Payer: Self-pay | Admitting: Neurology

## 2019-08-09 ENCOUNTER — Other Ambulatory Visit: Payer: Self-pay

## 2019-08-09 ENCOUNTER — Ambulatory Visit: Payer: Medicare HMO | Admitting: Neurology

## 2019-08-09 VITALS — BP 148/82 | HR 80 | Temp 97.5°F | Ht 65.0 in | Wt 185.0 lb

## 2019-08-09 DIAGNOSIS — G309 Alzheimer's disease, unspecified: Secondary | ICD-10-CM

## 2019-08-09 DIAGNOSIS — F0390 Unspecified dementia without behavioral disturbance: Secondary | ICD-10-CM

## 2019-08-09 DIAGNOSIS — F03918 Unspecified dementia, unspecified severity, with other behavioral disturbance: Secondary | ICD-10-CM | POA: Insufficient documentation

## 2019-08-09 DIAGNOSIS — R69 Illness, unspecified: Secondary | ICD-10-CM | POA: Diagnosis not present

## 2019-08-09 MED ORDER — MEMANTINE HCL 10 MG PO TABS: 10.0000 mg | ORAL_TABLET | Freq: Two times a day (BID) | ORAL | 11 refills | Status: DC

## 2019-08-09 NOTE — Progress Notes (Signed)
PATIENT: Darlene Ballard DOB: Jun 27, 1946  Chief Complaint  Patient presents with  . Memory Loss    MMSE - animals.  She is here with her daughter, Marshell Garfinkel, to discuss her slow, worsening memory loss.  Marland Kitchen PCP    Danford, Berna Spare, NP     HISTORICAL  Darlene Ballard is a 73 year old female, seen in request by her primary care physician nurse practitioner Mina Marble for evaluation of memory loss, she is accompanied by her daughter [name] at today's clinic visit on August 09, 2019.  I have reviewed and summarized the referring note from the referring physician.  She had a previous history of hypothyroidism, on supplement, but has stopped the thyroid supplement, and her Xanax abruptly around 2017, while helping her mother dealing with dementia.  She has strong family history of dementia, her mother, and a few maternal aunt suffered dementia.  She retired as a Dispensing optician at age 17, lives with her husband, has strained relationship, she was noted to have gradual onset memory loss since 2019, significant worsening since 2020.  She tends to repeat herself, got lost while driving sometimes, she has good appetite, sleeps well.  REVIEW OF SYSTEMS: Full 14 system review of systems performed and notable only for as above All other review of systems were negative.  ALLERGIES: No Known Allergies  HOME MEDICATIONS: Current Outpatient Medications  Medication Sig Dispense Refill  . chlorthalidone (HYGROTON) 25 MG tablet TAKE 1 TABLET DAILY. PATIENT MUST HAVE OFFICE VISIT PRIOR TO ANY FURTHER REFILLS 15 tablet 0  . Multiple Vitamin (MULTIVITAMIN) tablet Take 1 tablet by mouth daily.     No current facility-administered medications for this visit.     PAST MEDICAL HISTORY: No past medical history on file.  PAST SURGICAL HISTORY: Past Surgical History:  Procedure Laterality Date  . TONSILLECTOMY AND ADENOIDECTOMY      FAMILY HISTORY: Family History  Problem Relation Age of Onset   . Alzheimer's disease Mother   . Heart disease Mother   . Heart attack Mother     SOCIAL HISTORY: Social History   Socioeconomic History  . Marital status: Married    Spouse name: Not on file  . Number of children: Not on file  . Years of education: Not on file  . Highest education level: Not on file  Occupational History  . Not on file  Social Needs  . Financial resource strain: Not on file  . Food insecurity    Worry: Not on file    Inability: Not on file  . Transportation needs    Medical: Not on file    Non-medical: Not on file  Tobacco Use  . Smoking status: Never Smoker  . Smokeless tobacco: Never Used  Substance and Sexual Activity  . Alcohol use: Never    Frequency: Never  . Drug use: Never  . Sexual activity: Not Currently  Lifestyle  . Physical activity    Days per week: Not on file    Minutes per session: Not on file  . Stress: Not on file  Relationships  . Social Herbalist on phone: Not on file    Gets together: Not on file    Attends religious service: Not on file    Active member of club or organization: Not on file    Attends meetings of clubs or organizations: Not on file    Relationship status: Not on file  . Intimate partner violence  Fear of current or ex partner: Not on file    Emotionally abused: Not on file    Physically abused: Not on file    Forced sexual activity: Not on file  Other Topics Concern  . Not on file  Social History Narrative  . Not on file     PHYSICAL EXAM   Vitals:   08/09/19 1456  Height: 5\' 5"  (1.651 m)    Not recorded      Body mass index is 30.89 kg/m.  PHYSICAL EXAMNIATION:  Gen: NAD, conversant, well nourised, well groomed                     Cardiovascular: Regular rate rhythm, no peripheral edema, warm, nontender. Eyes: Conjunctivae clear without exudates or hemorrhage Neck: Supple, no carotid bruits. Pulmonary: Clear to auscultation bilaterally   NEUROLOGICAL EXAM:  MMSE -  Mini Mental State Exam 08/09/2019 12/15/2018 09/01/2018  Orientation to time 3 2 3   Orientation to Place 4 4 5   Registration 3 3 3   Attention/ Calculation 1 1 2   Recall 0 2 2  Language- name 2 objects 2 2 2   Language- repeat 1 1 1   Language- follow 3 step command 3 2 1   Language- read & follow direction 1 1 1   Write a sentence 1 0 1  Copy design 1 1 1   Total score 20 19 22   animal naming 5  CRANIAL NERVES: CN II: Visual fields are full to confrontation.  Pupils are round equal and briskly reactive to light. CN III, IV, VI: extraocular movement are normal. No ptosis. CN V: Facial sensation is intact to pinprick in all 3 divisions bilaterally. Corneal responses are intact.  CN VII: Face is symmetric with normal eye closure and smile. CN VIII: Hearing is normal to causal conversation. CN IX, X: Palate elevates symmetrically. Phonation is normal. CN XI: Head turning and shoulder shrug are intact CN XII: Tongue is midline with normal movements and no atrophy.  MOTOR: There is no pronator drift of out-stretched arms. Muscle bulk and tone are normal. Muscle strength is normal.  REFLEXES: Reflexes are 2+ and symmetric at the biceps, triceps, knees, and ankles. Plantar responses are flexor.  SENSORY: Intact to light touch, pinprick, positional sensation and vibratory sensation are intact in fingers and toes.  COORDINATION: Rapid alternating movements and fine finger movements are intact. There is no dysmetria on finger-to-nose and heel-knee-shin.    GAIT/STANCE: Posture is normal. Gait is steady with normal steps, base, arm swing, and turning. Heel and toe walking are normal. Tandem gait is normal.  Romberg is absent.   DIAGNOSTIC DATA (LABS, IMAGING, TESTING) - I reviewed patient records, labs, notes, testing and imaging myself where available.   ASSESSMENT AND PLAN  [name] is a 73 y.o. female   Dementia  Mini-Mental Status Examination 20/30  MRI of the brain to rule  out structural lesion  Laboratory evaluations  Add on Namenda 10 mg twice a day   Marcial Pacas, M.D. Ph.D.  [facility] [address], [place], [place] [address] Ph: [phone] Fax: [phone]  CC: Esaw Grandchild, NP

## 2019-08-10 ENCOUNTER — Telehealth: Payer: Self-pay

## 2019-08-10 ENCOUNTER — Telehealth: Payer: Self-pay | Admitting: Neurology

## 2019-08-10 LAB — THYROID PANEL WITH TSH
Free Thyroxine Index: 1.4 (ref 1.2–4.9)
T3 Uptake Ratio: 22 % — ABNORMAL LOW (ref 24–39)
T4, Total: 6.4 ug/dL (ref 4.5–12.0)
TSH: 7.15 u[IU]/mL — ABNORMAL HIGH (ref 0.450–4.500)

## 2019-08-10 LAB — CBC WITH DIFFERENTIAL
Basophils Absolute: 0.1 10*3/uL (ref 0.0–0.2)
Basos: 1 %
EOS (ABSOLUTE): 0.3 10*3/uL (ref 0.0–0.4)
Eos: 3 %
Hematocrit: 38.6 % (ref 34.0–46.6)
Hemoglobin: 13.2 g/dL (ref 11.1–15.9)
Immature Grans (Abs): 0 10*3/uL (ref 0.0–0.1)
Immature Granulocytes: 0 %
Lymphocytes Absolute: 2 10*3/uL (ref 0.7–3.1)
Lymphs: 21 %
MCH: 32.1 pg (ref 26.6–33.0)
MCHC: 34.2 g/dL (ref 31.5–35.7)
MCV: 94 fL (ref 79–97)
Monocytes Absolute: 0.6 10*3/uL (ref 0.1–0.9)
Monocytes: 6 %
Neutrophils Absolute: 6.7 10*3/uL (ref 1.4–7.0)
Neutrophils: 69 %
RBC: 4.11 x10E6/uL (ref 3.77–5.28)
RDW: 12.1 % (ref 11.7–15.4)
WBC: 9.6 10*3/uL (ref 3.4–10.8)

## 2019-08-10 LAB — VITAMIN B12: Vitamin B-12: 553 pg/mL (ref 232–1245)

## 2019-08-10 LAB — COMPREHENSIVE METABOLIC PANEL
ALT: 18 IU/L (ref 0–32)
AST: 28 IU/L (ref 0–40)
Albumin/Globulin Ratio: 2 (ref 1.2–2.2)
Albumin: 4.5 g/dL (ref 3.7–4.7)
Alkaline Phosphatase: 108 IU/L (ref 39–117)
BUN/Creatinine Ratio: 14 (ref 12–28)
BUN: 11 mg/dL (ref 8–27)
Bilirubin Total: 0.2 mg/dL (ref 0.0–1.2)
CO2: 26 mmol/L (ref 20–29)
Calcium: 9.6 mg/dL (ref 8.7–10.3)
Chloride: 101 mmol/L (ref 96–106)
Creatinine, Ser: 0.76 mg/dL (ref 0.57–1.00)
GFR calc Af Amer: 91 mL/min/{1.73_m2} (ref >59)
GFR calc non Af Amer: 79 mL/min/{1.73_m2} (ref >59)
Globulin, Total: 2.3 g/dL (ref 1.5–4.5)
Glucose: 68 mg/dL (ref 65–99)
Potassium: 3.8 mmol/L (ref 3.5–5.2)
Sodium: 142 mmol/L (ref 134–144)
Total Protein: 6.8 g/dL (ref 6.0–8.5)

## 2019-08-10 LAB — RPR: RPR Ser Ql: NONREACTIVE

## 2019-08-10 NOTE — Telephone Encounter (Signed)
I have spoken with the patient and provided her with the results below.  She verbalized understanding and will contact her PCP to schedule a follow up.

## 2019-08-10 NOTE — Telephone Encounter (Signed)
Please call patient, laboratory evaluation showed elevated TSH, with decreased T3 uptake ratio, consistent with hypothyroidism, she should contact her care physician for thyroid supplement.  I have forwarded the lab results to her primary care  Rest of the laboratory evaluations were normal

## 2019-08-10 NOTE — Telephone Encounter (Signed)
[employer] medicare order sent to [facility]. They will obtain the auth and reach out to the patient to schedule.  °

## 2019-08-10 NOTE — Telephone Encounter (Signed)
-----   Message from Esaw Grandchild, NP sent at 08/10/2019 12:20 PM EDT ----- Regarding: Thyroid Panel Good Afternoon Kenney Houseman, Can you please call Ms. Mendias and have her schedule an OV to discuss Thyroid levels and re-starting Levothyroxine. Thanks!Valetta Fuller  ----- Message ----- From: Marcial Pacas, MD Sent: 08/10/2019  11:49 AM EDT To: Esaw Grandchild, NP

## 2019-08-10 NOTE — Telephone Encounter (Signed)
LVM for pt to call to discuss.  T. Sabreen Kitchen, CMA  

## 2019-08-12 NOTE — Telephone Encounter (Signed)
LVM for pt to call to discuss.  T. Nelson, CMA  

## 2019-08-12 NOTE — Telephone Encounter (Signed)
Pt has appt 08/16/2019.  Charyl Bigger, CMA
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
## 2019-08-16 ENCOUNTER — Ambulatory Visit (INDEPENDENT_AMBULATORY_CARE_PROVIDER_SITE_OTHER): Payer: Medicare HMO | Admitting: Adult Health

## 2019-08-16 ENCOUNTER — Ambulatory Visit: Payer: Medicare HMO | Admitting: Adult Health

## 2019-08-16 ENCOUNTER — Encounter: Payer: Self-pay | Admitting: Adult Health

## 2019-08-16 ENCOUNTER — Other Ambulatory Visit: Payer: Self-pay

## 2019-08-16 VITALS — BP 158/77 | HR 66 | Temp 98.0°F | Ht 65.0 in | Wt 188.1 lb

## 2019-08-16 DIAGNOSIS — F0390 Unspecified dementia without behavioral disturbance: Secondary | ICD-10-CM

## 2019-08-16 DIAGNOSIS — E039 Hypothyroidism, unspecified: Secondary | ICD-10-CM | POA: Diagnosis not present

## 2019-08-16 DIAGNOSIS — Z0000 Encounter for general adult medical examination without abnormal findings: Secondary | ICD-10-CM

## 2019-08-16 DIAGNOSIS — I10 Essential (primary) hypertension: Secondary | ICD-10-CM | POA: Diagnosis not present

## 2019-08-16 DIAGNOSIS — M79645 Pain in left finger(s): Secondary | ICD-10-CM

## 2019-08-16 DIAGNOSIS — R69 Illness, unspecified: Secondary | ICD-10-CM | POA: Diagnosis not present

## 2019-08-16 DIAGNOSIS — R7989 Other specified abnormal findings of blood chemistry: Secondary | ICD-10-CM | POA: Diagnosis not present

## 2019-08-16 MED ORDER — HYDROCHLOROTHIAZIDE 12.5 MG PO TABS: 12.5000 mg | ORAL_TABLET | Freq: Every day | ORAL | 0 refills | Status: DC

## 2019-08-16 MED ORDER — HYDROCHLOROTHIAZIDE 25 MG PO TABS: 25.0000 mg | ORAL_TABLET | Freq: Every day | ORAL | 0 refills | Status: DC

## 2019-08-16 MED ORDER — LEVOTHYROXINE SODIUM 25 MCG PO TABS: 25.0000 ug | ORAL_TABLET | Freq: Every day | ORAL | 0 refills | Status: DC

## 2019-08-16 NOTE — Assessment & Plan Note (Signed)
She was previously on Levothyroxine 148mcg QD She abruptly stopped the Levothyroxine 01-31-2016 (after the death of her mother) Thyroid panel checked with Neurology 08/09/2019 TSH-elevated 7.150 Total T4- 6.4 T3 uptake - low, 22 Free Thyroxine Index - 1.4 She reports fatigue- will re-start on Levothyroxine 82mcg Re-check TSH in 6 weeks
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
## 2019-08-16 NOTE — Assessment & Plan Note (Signed)
Please call your Neurologist and inquire about cost assistance with Memantine (Namenda).

## 2019-08-16 NOTE — Assessment & Plan Note (Addendum)
She has been on HCTZ and Chlorthalidone for HTN and lower extremity edema- however she has not taken  chlorthalidone since July 2020. Chlorthalidone- not covered under her insurance- will re-start on HCTZ Please start Hydrochlorothiazide 12.5mg  once daily. Please check your blood pressure and heart rate daily- record on the log. Follow-up in 2 weeks- please bring log with you. Remain well hydrated, follow DASH diet.

## 2019-08-16 NOTE — Patient Instructions (Addendum)
DASH Eating Plan DASH stands for "Dietary Approaches to Stop Hypertension." The DASH eating plan is a healthy eating plan that has been shown to reduce high blood pressure (hypertension). It may also reduce your risk for type 2 diabetes, heart disease, and stroke. The DASH eating plan may also help with weight loss. What are tips for following this plan?  General guidelines  Avoid eating more than 2,300 mg (milligrams) of salt (sodium) a day. If you have hypertension, you may need to reduce your sodium intake to 1,500 mg a day.  Limit alcohol intake to no more than 1 drink a day for nonpregnant women and 2 drinks a day for men. One drink equals 12 oz of beer, 5 oz of wine, or 1 oz of hard liquor.  Work with your health care provider to maintain a healthy body weight or to lose weight. Ask what an ideal weight is for you.  Get at least 30 minutes of exercise that causes your heart to beat faster (aerobic exercise) most days of the week. Activities may include walking, swimming, or biking.  Work with your health care provider or diet and nutrition specialist (dietitian) to adjust your eating plan to your individual calorie needs. Reading food labels   Check food labels for the amount of sodium per serving. Choose foods with less than 5 percent of the Daily Value of sodium. Generally, foods with less than 300 mg of sodium per serving fit into this eating plan.  To find whole grains, look for the word "whole" as the first word in the ingredient list. Shopping  Buy products labeled as "low-sodium" or "no salt added."  Buy fresh foods. Avoid canned foods and premade or frozen meals. Cooking  Avoid adding salt when cooking. Use salt-free seasonings or herbs instead of table salt or sea salt. Check with your health care provider or pharmacist before using salt substitutes.  Do not fry foods. Cook foods using healthy methods such as baking, boiling, grilling, and broiling instead.  Cook with  heart-healthy oils, such as olive, canola, soybean, or sunflower oil. Meal planning  Eat a balanced diet that includes: ? 5 or more servings of fruits and vegetables each day. At each meal, try to fill half of your plate with fruits and vegetables. ? Up to 6-8 servings of whole grains each day. ? Less than 6 oz of lean meat, poultry, or fish each day. A 3-oz serving of meat is about the same size as a deck of cards. One egg equals 1 oz. ? 2 servings of low-fat dairy each day. ? A serving of nuts, seeds, or beans 5 times each week. ? Heart-healthy fats. Healthy fats called Omega-3 fatty acids are found in foods such as flaxseeds and coldwater fish, like sardines, salmon, and mackerel.  Limit how much you eat of the following: ? Canned or prepackaged foods. ? Food that is high in trans fat, such as fried foods. ? Food that is high in saturated fat, such as fatty meat. ? Sweets, desserts, sugary drinks, and other foods with added sugar. ? Full-fat dairy products.  Do not salt foods before eating.  Try to eat at least 2 vegetarian meals each week.  Eat more home-cooked food and less restaurant, buffet, and fast food.  When eating at a restaurant, ask that your food be prepared with less salt or no salt, if possible. What foods are recommended? The items listed may not be a complete list. Talk with your dietitian about   what dietary choices are best for you. Grains Whole-grain or whole-wheat bread. Whole-grain or whole-wheat pasta. Brown rice. Oatmeal. Quinoa. Bulgur. Whole-grain and low-sodium cereals. Pita bread. Low-fat, low-sodium crackers. Whole-wheat flour tortillas. Vegetables Fresh or frozen vegetables (raw, steamed, roasted, or grilled). Low-sodium or reduced-sodium tomato and vegetable juice. Low-sodium or reduced-sodium tomato sauce and tomato paste. Low-sodium or reduced-sodium canned vegetables. Fruits All fresh, dried, or frozen fruit. Canned fruit in natural juice (without  added sugar). Meat and other protein foods Skinless chicken or turkey. Ground chicken or turkey. Pork with fat trimmed off. Fish and seafood. Egg whites. Dried beans, peas, or lentils. Unsalted nuts, nut butters, and seeds. Unsalted canned beans. Lean cuts of beef with fat trimmed off. Low-sodium, lean deli meat. Dairy Low-fat (1%) or fat-free (skim) milk. Fat-free, low-fat, or reduced-fat cheeses. Nonfat, low-sodium ricotta or cottage cheese. Low-fat or nonfat yogurt. Low-fat, low-sodium cheese. Fats and oils Soft margarine without trans fats. Vegetable oil. Low-fat, reduced-fat, or light mayonnaise and salad dressings (reduced-sodium). Canola, safflower, olive, soybean, and sunflower oils. Avocado. Seasoning and other foods Herbs. Spices. Seasoning mixes without salt. Unsalted popcorn and pretzels. Fat-free sweets. What foods are not recommended? The items listed may not be a complete list. Talk with your dietitian about what dietary choices are best for you. Grains Baked goods made with fat, such as croissants, muffins, or some breads. Dry pasta or rice meal packs. Vegetables Creamed or fried vegetables. Vegetables in a cheese sauce. Regular canned vegetables (not low-sodium or reduced-sodium). Regular canned tomato sauce and paste (not low-sodium or reduced-sodium). Regular tomato and vegetable juice (not low-sodium or reduced-sodium). Pickles. Olives. Fruits Canned fruit in a light or heavy syrup. Fried fruit. Fruit in cream or butter sauce. Meat and other protein foods Fatty cuts of meat. Ribs. Fried meat. Bacon. Sausage. Bologna and other processed lunch meats. Salami. Fatback. Hotdogs. Bratwurst. Salted nuts and seeds. Canned beans with added salt. Canned or smoked fish. Whole eggs or egg yolks. Chicken or turkey with skin. Dairy Whole or 2% milk, cream, and half-and-half. Whole or full-fat cream cheese. Whole-fat or sweetened yogurt. Full-fat cheese. Nondairy creamers. Whipped toppings.  Processed cheese and cheese spreads. Fats and oils Butter. Stick margarine. Lard. Shortening. Ghee. Bacon fat. Tropical oils, such as coconut, palm kernel, or palm oil. Seasoning and other foods Salted popcorn and pretzels. Onion salt, garlic salt, seasoned salt, table salt, and sea salt. Worcestershire sauce. Tartar sauce. Barbecue sauce. Teriyaki sauce. Soy sauce, including reduced-sodium. Steak sauce. Canned and packaged gravies. Fish sauce. Oyster sauce. Cocktail sauce. Horseradish that you find on the shelf. Ketchup. Mustard. Meat flavorings and tenderizers. Bouillon cubes. Hot sauce and Tabasco sauce. Premade or packaged marinades. Premade or packaged taco seasonings. Relishes. Regular salad dressings. Where to find more information:  National Heart, Lung, and Blood Institute: www.nhlbi.nih.gov  American Heart Association: www.heart.org Summary  The DASH eating plan is a healthy eating plan that has been shown to reduce high blood pressure (hypertension). It may also reduce your risk for type 2 diabetes, heart disease, and stroke.  With the DASH eating plan, you should limit salt (sodium) intake to 2,300 mg a day. If you have hypertension, you may need to reduce your sodium intake to 1,500 mg a day.  When on the DASH eating plan, aim to eat more fresh fruits and vegetables, whole grains, lean proteins, low-fat dairy, and heart-healthy fats.  Work with your health care provider or diet and nutrition specialist (dietitian) to adjust your eating plan to your   individual calorie needs. This information is not intended to replace advice given to you by your health care provider. Make sure you discuss any questions you have with your health care provider. Document Released: 09/18/2011 Document Revised: 09/11/2017 Document Reviewed: 09/22/2016 Elsevier Patient Education  [place].  Please start Hydrochlorothiazide 12.5mg  once daily. Please check your blood pressure and heart rate  daily- record on the log. Follow-up in 2 weeks- please bring log with you. Please start Levothyroxine 8mcg once daily- before breakfast. Will need to check thyroid level in 6 weeks (non-fasting labs). Remain well hydrated, follow DASH diet. Please call your Neurologist and inquire about cost assistance with Memantine (Namenda). Left hand Xray ordered at [place]. Continue to social distance and wear a mask when in public. GREAT TO SEE YOU!

## 2019-08-16 NOTE — Assessment & Plan Note (Signed)
Please start Hydrochlorothiazide 12.5mg  once daily. Please check your blood pressure and heart rate daily- record on the log. Follow-up in 2 weeks- please bring log with you. Please start Levothyroxine 88mcg once daily- before breakfast. Will need to check thyroid level in 6 weeks (non-fasting labs). Remain well hydrated, follow DASH diet. Please call your Neurologist and inquire about cost assistance with Memantine (Namenda). Left hand Xray ordered at [place]. Continue to social distance and wear a mask when in public.

## 2019-09-03 ENCOUNTER — Ambulatory Visit
Admission: RE | Admit: 2019-09-03 | Discharge: 2019-09-03 | Disposition: A | Discharge status: Home / Self Care | Payer: Medicare HMO | Source: Ambulatory Visit | Attending: Neurology | Admitting: Neurology

## 2019-09-03 ENCOUNTER — Other Ambulatory Visit: Payer: Self-pay

## 2019-09-03 DIAGNOSIS — G309 Alzheimer's disease, unspecified: Secondary | ICD-10-CM

## 2019-09-06 ENCOUNTER — Telehealth: Payer: Self-pay | Admitting: *Deleted

## 2019-09-06 NOTE — Telephone Encounter (Signed)
-----   Message from Penni Bombard, MD sent at 09/05/2019  6:17 PM EST ----- Unremarkable imaging results. Please call patient. Continue current plan. -VRP

## 2019-09-06 NOTE — Telephone Encounter (Signed)
I reached out to patient's daughter, Marshell Garfinkel.  Left message with MRI results, instructions to continue current medications and keep follow appt.  Provided our number to call back with any questions.

## 2019-09-10 ENCOUNTER — Other Ambulatory Visit: Payer: Self-pay | Admitting: Adult Health

## 2019-09-12 ENCOUNTER — Telehealth: Payer: Self-pay

## 2019-09-12 NOTE — Telephone Encounter (Signed)
Please call pt to schedule appt.  No further refills until pt is seen.  T. Anna Beaird, CMA  

## 2019-09-27 ENCOUNTER — Encounter: Payer: Self-pay | Admitting: Adult Health

## 2019-09-27 ENCOUNTER — Other Ambulatory Visit: Payer: Self-pay

## 2019-09-27 ENCOUNTER — Ambulatory Visit (INDEPENDENT_AMBULATORY_CARE_PROVIDER_SITE_OTHER): Payer: Medicare HMO | Admitting: Adult Health

## 2019-09-27 DIAGNOSIS — F0390 Unspecified dementia without behavioral disturbance: Secondary | ICD-10-CM | POA: Diagnosis not present

## 2019-09-27 DIAGNOSIS — I10 Essential (primary) hypertension: Secondary | ICD-10-CM

## 2019-09-27 DIAGNOSIS — Z0000 Encounter for general adult medical examination without abnormal findings: Secondary | ICD-10-CM

## 2019-09-27 DIAGNOSIS — R69 Illness, unspecified: Secondary | ICD-10-CM | POA: Diagnosis not present

## 2019-09-27 NOTE — Assessment & Plan Note (Signed)
08/2019 MRI- neg  F/u with Neuro 11/07/2019 Appears that she is not taking Namenda as directed

## 2019-09-27 NOTE — Assessment & Plan Note (Signed)
HCTZ 12.5mg  Stressed importance of taking daily Check BP/HR daily- bring log to f/u at lab appt

## 2019-09-27 NOTE — Assessment & Plan Note (Signed)
Assessment and Plan: Stressed the importance of taking medications as directed, recommended using a pillbox to help with compliance. Remain well hydrated, follow Mediterranean diet. Remain as active as possible. Check, record BP/HR- bring log to lab appt in 4 weeks. Since she has only been on Levothyroxine for 2 weeks, too soon to re-check TSH- will re-check in 4 weeks. If you ever feel unsafe at home- call 911 Continue to social distance and wear a mask when in public.  Follow Up Instructions: 4 weeks non-fasting lag 3 months OV   I discussed the assessment and treatment plan with the patient. The patient was provided an opportunity to ask questions and all were answered. The patient agreed with the plan and demonstrated an understanding of the instructions.   The patient was advised to call back or seek an in-person evaluation if the symptoms worsen or if the condition fails to improve as anticipated.

## 2019-09-27 NOTE — Progress Notes (Signed)
Virtual Visit via Telephone Note  I connected with Darlene Ballard on 09/27/19 at 10:30 AM EST by telephone and verified that I am speaking with the correct person using two identifiers.  Location: Patient:Home Provider:In Clinic   I discussed the limitations, risks, security and privacy concerns of performing an evaluation and management service by telephone and the availability of in person appointments. I also discussed with the patient that there may be a patient responsible charge related to this service. The patient expressed understanding and agreed to proceed.   History of Present Illness: This note is not being shared with the patient for the following reason: To prevent harm (release of this note would result in harm to the life or physical safety of the patient or another). 08/16/2019 OV: Darlene Ballard presents for f/u: HTN, dementia, Hypothyroidism She has been on HCTZ and Chlorthalidone for HTN and lower extremity edema- however she has not taken  chlorthalidone since July 2020. Chlorthalidone- not covered under her insurance- will re-start on HCTZ Last OV here was 12/15/2018- instructed to f/u in 2 weeks- she did not LLE edema has returned. She denies CP/chest tightness with exertion. She reports having a stress test 5 years ago "because I wanted one before I retired". She claims this was accomplished in [place]- however I cannot locate records in [facility].  She was previously on Levothyroxine 190mcg QD She abruptly stopped the Levothyroxine 2016-01-17 (after the death of her mother) Thyroid panel checked with Neurology 08/09/2019 TSH-elevated 7.150 Total T4- 6.4 T3 uptake - low, 22 Free Thyroxine Index - 1.4 She reports fatigue- will re-start on Levothyroxine 55mcg Re-check TSH in 6 weeks  08/09/2019 Neurology OV- Dementia Mini-Mental Status Examination 20/30 MRI of the brain to rule out structural lesion Laboratory evaluations Add on  Namenda 10 mg twice a day PATIENT HAS NOT STARTED NAMENDA DUE TO COST- Instructed to reach out to Neurology with help with cost savings.  She reports L thumb since 01-17-19, she denies acute injury/trauma to L hand prior to onset of pain. She reports pain localized to L thumb PIP. Pain 3/10 at rest, 10/10 with use or palpation, pain described as "stabbing". She is R hand dominant.  She reports improved home life between herself and her estranged husband.  They share the home, however live independently of each other. He has started paying more house hold bills, which has reduced Darlene Ballard's financial burden. She cares for 23 cats and 9 dogs!  Daughter at [facility] [employer] during [place].  09/27/2019 OV: She was re-started on Levothyroxine 66mcg 08/16/2019 She has only been taking the medication for the last two weeks, due to misplacing the Rx. She has not been checking BP/HR regularly, but verbalized that she will start. She denies CP/Chest tightness with exertion. She reports reduction in lower ext edema. She continues to abstain from tobacco/vape use. She remains active with house/yard work and caring for 9 dogs (who live indoor/outodoor). She reports things are amicable between herself and her estranged husband at home.  She denies safety issues. She estimates to drink >50 oz water/day, follows heart healthy diet.  She established with Neurologist 08/09/2019  09/03/2019 MR Brain WO Contrast- MRI brain (without) demonstrating: - Scattered periventricular and subcortical chronic small vessel ischemic disease.  - No acute findings.   Neuro f/u 11/07/2019  Pt currently feels that her memory is stable.  Daughter was on the call this morning.  Patient Care Team    Relationship Specialty Notifications Start End  [place], [place]  D, NP PCP - General Family Medicine  08/03/18     Patient Active Problem List   Diagnosis Date Noted  . Dementia without behavioral disturbance ([place]) 08/09/2019  .  Hyperlipidemia 02/01/2019  . Elevated TSH 02/01/2019  . Memory deficit 08/18/2018  . Healthcare maintenance 08/03/2018  . Anxiety 08/03/2018  . Hypertension 08/03/2018     Past Medical History:  Diagnosis Date  . Memory loss      Past Surgical History:  Procedure Laterality Date  . TONSILLECTOMY AND ADENOIDECTOMY       Family History  Problem Relation Age of Onset  . Alzheimer's disease Mother   . Heart disease Mother   . Heart attack Mother   . Other Father        blood disorder - unsure of condition     Social History   Substance and Sexual Activity  Drug Use Never     Social History   Substance and Sexual Activity  Alcohol Use Never     Social History   Tobacco Use  Smoking Status Never Smoker  Smokeless Tobacco Never Used     Outpatient Encounter Medications as of 09/27/2019  Medication Sig  . ASPIRIN 81 PO Take 81 mg by mouth daily.  . hydrochlorothiazide (HYDRODIURIL) 12.5 MG tablet Take 1 tablet (12.5 mg total) by mouth daily. OFFICE VISIT REQUIRED PRIOR TO ANY FURTHER REFILLS  . levothyroxine (SYNTHROID) 25 MCG tablet Take 1 tablet (25 mcg total) by mouth daily before breakfast.  . Multiple Vitamin (MULTIVITAMIN) tablet Take 1 tablet by mouth daily.   No facility-administered encounter medications on file as of 09/27/2019.    Allergies: Patient has no known allergies.  There is no height or weight on file to calculate BMI.  There were no vitals taken for this visit. Review of Systems: General:   Denies fever, chills, unexplained weight loss.  Optho/Auditory:   Denies visual changes, blurred vision/LOV Respiratory:   Denies SOB, DOE more than baseline levels.  Cardiovascular:   Denies chest pain, palpitations, new onset peripheral edema  Gastrointestinal:   Denies nausea, vomiting, diarrhea.  Genitourinary: Denies dysuria, freq/ urgency, flank pain or discharge from genitals.  Endocrine:     Denies hot or cold intolerance, polyuria,  polydipsia. Musculoskeletal:   Denies unexplained myalgias, joint swelling, unexplained arthralgias, gait problems.  Skin:  Denies rash, suspicious lesions Neurological:     Denies dizziness, unexplained weakness, numbness  Psychiatric/Behavioral:   Denies mood changes, suicidal or homicidal ideations, hallucinations  Observations/Objective: No acute distress noted during telephone conversation.  Assessment and Plan: Stressed the importance of taking medications as directed, recommended using a pillbox to help with compliance. Remain well hydrated, follow Mediterranean diet. Remain as active as possible. Check, record BP/HR- bring log to lab appt in 4 weeks. Since she has only been on Levothyroxine for 2 weeks, too soon to re-check TSH- will re-check in 4 weeks. If you ever feel unsafe at home- call 911 Continue to social distance and wear a mask when in public.  Follow Up Instructions: 4 weeks non-fasting lag 3 months OV   I discussed the assessment and treatment plan with the patient. The patient was provided an opportunity to ask questions and all were answered. The patient agreed with the plan and demonstrated an understanding of the instructions.   The patient was advised to call back or seek an in-person evaluation if the symptoms worsen or if the condition fails to improve as anticipated.  I provided 9 minutes of  non-face-to-face time during this encounter.   Esaw Grandchild, NP

## 2019-10-06 ENCOUNTER — Other Ambulatory Visit: Payer: Self-pay | Admitting: Adult Health

## 2019-10-10 ENCOUNTER — Other Ambulatory Visit: Payer: Self-pay | Admitting: Adult Health

## 2019-10-24 ENCOUNTER — Other Ambulatory Visit: Payer: Medicare HMO

## 2019-10-24 ENCOUNTER — Other Ambulatory Visit: Payer: Self-pay

## 2019-10-24 DIAGNOSIS — R7989 Other specified abnormal findings of blood chemistry: Secondary | ICD-10-CM | POA: Diagnosis not present

## 2019-10-24 DIAGNOSIS — E039 Hypothyroidism, unspecified: Secondary | ICD-10-CM | POA: Diagnosis not present

## 2019-10-25 ENCOUNTER — Other Ambulatory Visit: Payer: Self-pay | Admitting: Adult Health

## 2019-10-25 LAB — TSH: TSH: 8.37 u[IU]/mL — ABNORMAL HIGH (ref 0.450–4.500)

## 2019-10-25 MED ORDER — LEVOTHYROXINE SODIUM 25 MCG PO TABS: ORAL_TABLET | ORAL | 0 refills | Status: DC

## 2019-11-07 ENCOUNTER — Ambulatory Visit: Payer: Medicare HMO | Admitting: Neurology

## 2019-11-22 ENCOUNTER — Other Ambulatory Visit: Payer: Self-pay | Admitting: Adult Health

## 2020-01-03 ENCOUNTER — Ambulatory Visit: Payer: Medicare HMO | Admitting: Neurology

## 2020-01-03 ENCOUNTER — Other Ambulatory Visit: Payer: Self-pay

## 2020-01-03 ENCOUNTER — Encounter: Payer: Self-pay | Admitting: Neurology

## 2020-01-03 VITALS — BP 140/80 | HR 69 | Temp 97.2°F | Ht 63.0 in | Wt 190.0 lb

## 2020-01-03 DIAGNOSIS — F0390 Unspecified dementia without behavioral disturbance: Secondary | ICD-10-CM | POA: Diagnosis not present

## 2020-01-03 DIAGNOSIS — R69 Illness, unspecified: Secondary | ICD-10-CM | POA: Diagnosis not present

## 2020-01-03 MED ORDER — DONEPEZIL HCL 10 MG PO TABS: 10.0000 mg | ORAL_TABLET | Freq: Every day | ORAL | 11 refills | Status: DC

## 2020-01-03 MED ORDER — MEMANTINE HCL 10 MG PO TABS: 10.0000 mg | ORAL_TABLET | Freq: Two times a day (BID) | ORAL | 4 refills | Status: DC

## 2020-01-03 NOTE — Progress Notes (Signed)
PATIENT: Darlene Ballard DOB: [birth year]  Chief Complaint  Patient presents with  . Follow-up    pt with daughter rm 4     HISTORICAL  Darlene Ballard is a 74 year old female, seen in request by her primary care physician nurse practitioner Darlene Ballard for evaluation of memory loss, she is accompanied by her daughter Darlene Ballard at today's clinic visit on August 09, 2019.  I have reviewed and summarized the referring note from the referring physician.  She had a previous history of hypothyroidism, on supplement, but has stopped the thyroid supplement, and her Xanax abruptly around 2017, while helping her mother dealing with dementia.  She has strong family history of dementia, her mother, and a few maternal aunt suffered dementia.  She retired as a Dispensing optician at age 72, lives with her husband, has strained relationship, she was noted to have gradual onset memory loss since 2019, significant worsening since 2020.  She tends to repeat herself, got lost while driving sometimes, she has good appetite, sleeps well.  UPDATE January 03 2020: There was no significant change in her memory, Mini-Mental Status Examination 16 out of 30 today, she enjoys taking care of many dogs and cats at home, she has mild gait abnormality due to bilateral knee pain  I personally reviewed MRI of the brain in November 2020, no acute abnormality, mild supratentorium small vessel disease  Laboratory evaluations in 2021, negative RPR, B12, CMP, CBC, elevated TSH, her thyroid supplement dose has increased the symptoms.  REVIEW OF SYSTEMS: Full 14 system review of systems performed and notable only for as above All other review of systems were negative.  ALLERGIES: No Known Allergies  HOME MEDICATIONS: Current Outpatient Medications  Medication Sig Dispense Refill  . ASPIRIN 81 PO Take 81 mg by mouth daily.    . hydrochlorothiazide (HYDRODIURIL) 12.5 MG tablet Take 1 tablet (12.5 mg total) by mouth daily. 90  tablet 0  . levothyroxine (SYNTHROID) 25 MCG tablet 1 and 1/2 tab per day=37.5 mg per day 90 tablet 0  . Multiple Vitamin (MULTIVITAMIN) tablet Take 1 tablet by mouth daily.     No current facility-administered medications for this visit.    PAST MEDICAL HISTORY: Past Medical History:  Diagnosis Date  . Memory loss     PAST SURGICAL HISTORY: Past Surgical History:  Procedure Laterality Date  . TONSILLECTOMY AND ADENOIDECTOMY      FAMILY HISTORY: Family History  Problem Relation Age of Onset  . Alzheimer's disease Mother   . Heart disease Mother   . Heart attack Mother   . Other Father        blood disorder - unsure of condition    SOCIAL HISTORY: Social History   Socioeconomic History  . Marital status: Married    Spouse name: Not on file  . Number of children: 1  . Years of education: 2 years college  . Highest education level: Not on file  Occupational History  . Occupation: Retired  Tobacco Use  . Smoking status: Never Smoker  . Smokeless tobacco: Never Used  Substance and Sexual Activity  . Alcohol use: Never  . Drug use: Never  . Sexual activity: Not Currently  Other Topics Concern  . Not on file  Social History Narrative   Lives at home with husband.   Right-handed.   2 cups caffeine per day.   Social Determinants of Health   Financial Resource Strain:   . Difficulty of Paying Living Expenses:  Food Insecurity:   . Worried About Charity fundraiser in the Last Year:   . Arboriculturist in the Last Year:   Transportation Needs:   . Film/video editor (Medical):   Marland Kitchen Lack of Transportation (Non-Medical):   Physical Activity:   . Days of Exercise per Week:   . Minutes of Exercise per Session:   Stress:   . Feeling of Stress :   Social Connections:   . Frequency of Communication with Friends and Family:   . Frequency of Social Gatherings with Friends and Family:   . Attends Religious Services:   . Active Member of Clubs or Organizations:    . Attends Archivist Meetings:   Marland Kitchen Marital Status:   Intimate Partner Violence:   . Fear of Current or Ex-Partner:   . Emotionally Abused:   Marland Kitchen Physically Abused:   . Sexually Abused:      PHYSICAL EXAM   Vitals:   01/03/20 0718  BP: 140/80  Pulse: 69  Temp: (!) 97.2 F (36.2 C)  Weight: 190 lb (86.2 kg)  Height: 5\' 3"  (1.6 m)    Not recorded      Body mass index is 33.66 kg/m.  PHYSICAL EXAMNIATION:  Gen: NAD, conversant, well nourised, well groomed                     Cardiovascular: Regular rate rhythm, no peripheral edema, warm, nontender. Eyes: Conjunctivae clear without exudates or hemorrhage Neck: Supple, no carotid bruits. Pulmonary: Clear to auscultation bilaterally   NEUROLOGICAL EXAM:  MMSE - Mini Mental State Exam 01/03/2020 08/09/2019 12/15/2018  Orientation to time 0 3 2  Orientation to Place 3 4 4   Registration 3 3 3   Attention/ Calculation 1 1 1   Recall 0 0 2  Language- name 2 objects 2 2 2   Language- repeat 1 1 1   Language- follow 3 step command 3 3 2   Language- read & follow direction 1 1 1   Write a sentence 1 1 0  Copy design 1 1 1   Total score 16 20 19   animal naming 3  CRANIAL NERVES: CN II: Visual fields are full to confrontation.  Pupils are round equal and briskly reactive to light. CN III, IV, VI: extraocular movement are normal. No ptosis. CN V: Facial sensation is intact to pinprick in all 3 divisions bilaterally. Corneal responses are intact.  CN VII: Face is symmetric with normal eye closure and smile. CN VIII: Hearing is normal to causal conversation. CN IX, X: Palate elevates symmetrically. Phonation is normal. CN XI: Head turning and shoulder shrug are intact CN XII: Tongue is midline with normal movements and no atrophy.  MOTOR: There is no pronator drift of out-stretched arms. Muscle bulk and tone are normal. Muscle strength is normal.  REFLEXES: Reflexes hypoactive and symmetric at the biceps, triceps,  knees, and ankles. Plantar responses are flexor.  SENSORY: Intact to light touch, pinprick, positional sensation and vibratory sensation are intact in fingers and toes.  COORDINATION: Rapid alternating movements and fine finger movements are intact. There is no dysmetria on finger-to-nose and heel-knee-shin.    GAIT/STANCE: She needs push on chair arm to get up from seated position, bilateral valgus, antalgic unsteady gait Romberg is absent.   DIAGNOSTIC DATA (LABS, IMAGING, TESTING) - I reviewed patient records, labs, notes, testing and imaging myself where available.   ASSESSMENT AND PLAN  KANOE OLAND is a 74 y.o. female   Dementia  Mini-Mental Status Examination 16/30  MRI of the brain showed generalized atrophy, small vessel disease  Laboratory evaluations showed mild hypothyroidism, thyroid supplement has been adjusted,  Most likely central nervous system degenerative disorder, likely Alzheimer's disease  She tolerated Namenda 10 mg twice a day, Aricept 10 mg daily  Encouraged her moderate exercise  Return to clinic with nurse practitioner Darlene Ballard in 6 months   Marcial Pacas, M.D. Ph.D.  [facility] [address], [place], [place] [address] Ph: [phone] Fax: [phone]  CC: Esaw Grandchild, NP

## 2020-01-13 ENCOUNTER — Other Ambulatory Visit: Payer: Self-pay | Admitting: Adult Health

## 2020-01-15 ENCOUNTER — Emergency Department (HOSPITAL_COMMUNITY)
Admission: EM | Admit: 2020-01-15 | Discharge: 2020-01-15 | Disposition: A | Discharge status: Home / Self Care | Payer: Medicare HMO | Attending: Emergency Medicine | Admitting: Emergency Medicine

## 2020-01-15 ENCOUNTER — Emergency Department (HOSPITAL_COMMUNITY): Payer: Medicare HMO

## 2020-01-15 ENCOUNTER — Encounter (HOSPITAL_COMMUNITY): Payer: Self-pay

## 2020-01-15 ENCOUNTER — Other Ambulatory Visit: Payer: Self-pay

## 2020-01-15 DIAGNOSIS — I10 Essential (primary) hypertension: Secondary | ICD-10-CM | POA: Insufficient documentation

## 2020-01-15 DIAGNOSIS — R42 Dizziness and giddiness: Secondary | ICD-10-CM | POA: Diagnosis not present

## 2020-01-15 DIAGNOSIS — R4182 Altered mental status, unspecified: Secondary | ICD-10-CM | POA: Diagnosis not present

## 2020-01-15 DIAGNOSIS — Z7982 Long term (current) use of aspirin: Secondary | ICD-10-CM | POA: Insufficient documentation

## 2020-01-15 DIAGNOSIS — R0789 Other chest pain: Secondary | ICD-10-CM | POA: Diagnosis not present

## 2020-01-15 DIAGNOSIS — Z79899 Other long term (current) drug therapy: Secondary | ICD-10-CM | POA: Diagnosis not present

## 2020-01-15 DIAGNOSIS — R079 Chest pain, unspecified: Secondary | ICD-10-CM | POA: Diagnosis not present

## 2020-01-15 DIAGNOSIS — R69 Illness, unspecified: Secondary | ICD-10-CM | POA: Diagnosis not present

## 2020-01-15 DIAGNOSIS — F0390 Unspecified dementia without behavioral disturbance: Secondary | ICD-10-CM | POA: Insufficient documentation

## 2020-01-15 LAB — URINALYSIS, ROUTINE W REFLEX MICROSCOPIC
Bilirubin Urine: NEGATIVE
Glucose, UA: NEGATIVE mg/dL
Hgb urine dipstick: NEGATIVE
Ketones, ur: 5 mg/dL — AB
Nitrite: NEGATIVE
Protein, ur: NEGATIVE mg/dL
Specific Gravity, Urine: 1.019 (ref 1.005–1.030)
pH: 6 (ref 5.0–8.0)

## 2020-01-15 LAB — CBC
HCT: 44.2 % (ref 36.0–46.0)
HCT: 42.8 % (ref 36.0–46.0)
Hemoglobin: 14.6 g/dL (ref 12.0–15.0)
Hemoglobin: 14 g/dL (ref 12.0–15.0)
MCH: 31.5 pg (ref 26.0–34.0)
MCH: 31.3 pg (ref 26.0–34.0)
MCHC: 33 g/dL (ref 30.0–36.0)
MCHC: 32.7 g/dL (ref 30.0–36.0)
MCV: 95.5 fL (ref 80.0–100.0)
MCV: 95.5 fL (ref 80.0–100.0)
Platelets: 194 10*3/uL (ref 150–400)
Platelets: 197 10*3/uL (ref 150–400)
RBC: 4.63 MIL/uL (ref 3.87–5.11)
RBC: 4.48 MIL/uL (ref 3.87–5.11)
RDW: 12.5 % (ref 11.5–15.5)
RDW: 12.7 % (ref 11.5–15.5)
WBC: 8.5 10*3/uL (ref 4.0–10.5)
WBC: 9.9 10*3/uL (ref 4.0–10.5)
nRBC: 0 % (ref 0.0–0.2)
nRBC: 0 % (ref 0.0–0.2)

## 2020-01-15 LAB — DIFFERENTIAL
Abs Immature Granulocytes: 0.03 10*3/uL (ref 0.00–0.07)
Basophils Absolute: 0 10*3/uL (ref 0.0–0.1)
Basophils Relative: 0 %
Eosinophils Absolute: 0.1 10*3/uL (ref 0.0–0.5)
Eosinophils Relative: 1 %
Immature Granulocytes: 0 %
Lymphocytes Relative: 8 %
Lymphs Abs: 0.8 10*3/uL (ref 0.7–4.0)
Monocytes Absolute: 0.4 10*3/uL (ref 0.1–1.0)
Monocytes Relative: 4 %
Neutro Abs: 8.6 10*3/uL — ABNORMAL HIGH (ref 1.7–7.7)
Neutrophils Relative %: 87 %

## 2020-01-15 LAB — BASIC METABOLIC PANEL
Anion gap: 14 (ref 5–15)
BUN: 8 mg/dL (ref 8–23)
CO2: 23 mmol/L (ref 22–32)
Calcium: 9.2 mg/dL (ref 8.9–10.3)
Chloride: 100 mmol/L (ref 98–111)
Creatinine, Ser: 0.77 mg/dL (ref 0.44–1.00)
GFR calc Af Amer: >60 mL/min (ref >60)
GFR calc non Af Amer: >60 mL/min (ref >60)
Glucose, Bld: 117 mg/dL — ABNORMAL HIGH (ref 70–99)
Potassium: 3.1 mmol/L — ABNORMAL LOW (ref 3.5–5.1)
Sodium: 137 mmol/L (ref 135–145)

## 2020-01-15 LAB — RAPID URINE DRUG SCREEN, HOSP PERFORMED
Amphetamines: NOT DETECTED
Barbiturates: NOT DETECTED
Benzodiazepines: NOT DETECTED
Cocaine: NOT DETECTED
Opiates: NOT DETECTED
Tetrahydrocannabinol: NOT DETECTED

## 2020-01-15 LAB — PROTIME-INR
INR: 1 (ref 0.8–1.2)
Prothrombin Time: 13.3 seconds (ref 11.4–15.2)

## 2020-01-15 LAB — TROPONIN I (HIGH SENSITIVITY)
Troponin I (High Sensitivity): 5 ng/L (ref <18)
Troponin I (High Sensitivity): 6 ng/L (ref <18)

## 2020-01-15 LAB — APTT: aPTT: 29 seconds (ref 24–36)

## 2020-01-15 MED ORDER — ACETAMINOPHEN 325 MG PO TABS
650.0000 mg | ORAL_TABLET | Freq: Once | ORAL | Status: AC
  Administered 2020-01-15: 650 mg via ORAL
  Filled 2020-01-15: qty 2

## 2020-01-15 MED ORDER — SODIUM CHLORIDE 0.9% FLUSH
3.0000 mL | Freq: Once | INTRAVENOUS | Status: AC
  Administered 2020-01-15: 3 mL via INTRAVENOUS

## 2020-01-15 NOTE — ED Triage Notes (Signed)
Onset 30 min PTA chest pain.  Pt tearful in triage denies chest pain at this time.  Pt holding head and c/o "something feels funny".  Then states she is dizzy, unknown onset.  Daughter at side.

## 2020-01-15 NOTE — ED Notes (Signed)
Pt ambulated to the bathroom and did not complain of dizziness.

## 2020-01-15 NOTE — ED Provider Notes (Signed)
[facility] EMERGENCY DEPARTMENT Provider Note   CSN: [id number] Arrival date & time: 01/15/20  1119     History Chief complaint, chest pain and or dizziness  Darlene Ballard is a 74 y.o. female.  HPI  HPI: A 74 year old patient with a history of hypertension, hypercholesterolemia and obesity presents for evaluation of chest pain. Initial onset of pain was approximately 1-3 hours ago. The patient's chest pain is not worse with exertion. The patient's chest pain is not middle- or left-sided, is not well-localized, is not described as heaviness/pressure/tightness, is not sharp and does not radiate to the arms/jaw/neck. The patient does not complain of nausea and denies diaphoresis. The patient has no history of stroke, has no history of peripheral artery disease, has not smoked in the past 90 days, denies any history of treated diabetes and has no relevant family history of coronary artery disease (first degree relative at less than age 7). The patient has a history of dementia.  The daughter states that the patient called her and said about 30 minutes prior to arrival in the ED she had been complaining of chest pain.  This is what initially brought him to the emergency department.  Patient however right now does not remember having any chest pain.  She is not having any chest pain right now.  Patient tells me he the reason she is here is because she is feeling dizzy.  Patient states she feels off balance and unsteady.  She is not really sure when this started.  She tells me she thinks it started this morning but she cannot tell me the specific time.  When I asked her if she felt fine when she went to bed last night she said she did not go to bed she was up watching TV.  She thinks the symptoms started in the morning.  She does remember feeling okay yesterday morning as well as yesterday afternoon.  She denies any focal numbness or weakness.  No trouble with her speech or vision.  She  does feel shaky and cold.  Daughter states she has history of dementia and did start a new medication Aricept within the last week or so.  Past Medical History:  Diagnosis Date  . Memory loss     Patient Active Problem List   Diagnosis Date Noted  . Dementia without behavioral disturbance ([place]) 08/09/2019  . Hyperlipidemia 02/01/2019  . Elevated TSH 02/01/2019  . Memory deficit 08/18/2018  . Healthcare maintenance 08/03/2018  . Anxiety 08/03/2018  . Hypertension 08/03/2018    Past Surgical History:  Procedure Laterality Date  . TONSILLECTOMY AND ADENOIDECTOMY       OB History   No obstetric history on file.     Family History  Problem Relation Age of Onset  . Alzheimer's disease Mother   . Heart disease Mother   . Heart attack Mother   . Other Father        blood disorder - unsure of condition    Social History   Tobacco Use  . Smoking status: Never Smoker  . Smokeless tobacco: Never Used  Substance Use Topics  . Alcohol use: Never  . Drug use: Never    Home Medications Prior to Admission medications   Medication Sig Start Date End Date Taking? Authorizing Provider  ASPIRIN 81 PO Take 81 mg by mouth daily.    [provider]  donepezil (ARICEPT) 10 MG tablet Take 1 tablet (10 mg total) by mouth at bedtime.  01/03/20   Marcial Pacas, MD  hydrochlorothiazide (HYDRODIURIL) 12.5 MG tablet Take 1 tablet (12.5 mg total) by mouth daily. 10/10/19   Danford, Valetta Fuller D, NP  levothyroxine (SYNTHROID) 25 MCG tablet 1 and 1/2 tab per day=37.5 mg per day 10/25/19   Mina Marble D, NP  memantine (NAMENDA) 10 MG tablet Take 1 tablet (10 mg total) by mouth 2 (two) times daily. 01/03/20   Marcial Pacas, MD  Multiple Vitamin (MULTIVITAMIN) tablet Take 1 tablet by mouth daily.    [provider]    Allergies    Patient has no known allergies.  Review of Systems   Review of Systems  All other systems reviewed and are negative.   Physical Exam Updated Vital  Signs BP 122/81 (BP Location: Right Arm)   Pulse 62   Temp 98.9 F (37.2 C) (Oral)   Resp 16   Ht 1.6 m (5\' 3" )   Wt 89.4 kg   SpO2 98%   BMI 34.90 kg/m   Physical Exam Vitals and nursing note reviewed.  Constitutional:      General: She is not in acute distress.    Appearance: She is well-developed.  HENT:     Head: Normocephalic and atraumatic.     Right Ear: External ear normal.     Left Ear: External ear normal.  Eyes:     General: No scleral icterus.       Right eye: No discharge.        Left eye: No discharge.     Conjunctiva/sclera: Conjunctivae normal.  Neck:     Trachea: No tracheal deviation.  Cardiovascular:     Rate and Rhythm: Normal rate and regular rhythm.  Pulmonary:     Effort: Pulmonary effort is normal. No respiratory distress.     Breath sounds: Normal breath sounds. No stridor. No wheezing or rales.  Abdominal:     General: Bowel sounds are normal. There is no distension.     Palpations: Abdomen is soft.     Tenderness: There is no abdominal tenderness. There is no guarding or rebound.  Musculoskeletal:        General: No tenderness.     Cervical back: Neck supple.  Skin:    General: Skin is warm and dry.     Findings: No rash.  Neurological:     Mental Status: She is alert. She is disoriented.     GCS: GCS eye subscore is 4. GCS verbal subscore is 4. GCS motor subscore is 6.     Cranial Nerves: No cranial nerve deficit (No facial droop, extraocular movements intact, tongue midline ).     Sensory: No sensory deficit.     Motor: Tremor present. No abnormal muscle tone or seizure activity.     Coordination: Coordination normal.     Comments: No pronator drift bilateral upper extrem, able to hold both legs off bed for 5 seconds, sensation intact in all extremities, no visual field cuts, no left or right sided neglect, normal finger-nose exam bilaterally, no nystagmus noted      ED Results / Procedures / Treatments   Labs (all labs ordered are  listed, but only abnormal results are displayed) Labs Reviewed  BASIC METABOLIC PANEL - Abnormal; Notable for the following components:      Result Value   Potassium 3.1 (*)    Glucose, Bld 117 (*)    All other components within normal limits  DIFFERENTIAL - Abnormal; Notable for the following components:  Neutro Abs 8.6 (*)    All other components within normal limits  URINALYSIS, ROUTINE W REFLEX MICROSCOPIC - Abnormal; Notable for the following components:   Ketones, ur 5 (*)    Leukocytes,Ua TRACE (*)    Bacteria, UA RARE (*)    All other components within normal limits  CBC  PROTIME-INR  APTT  CBC  ETHANOL  RAPID URINE DRUG SCREEN, HOSP PERFORMED  TROPONIN I (HIGH SENSITIVITY)  TROPONIN I (HIGH SENSITIVITY)    EKG EKG Interpretation  Date/Time:  Sunday January 15 2020 12:00:38 EDT Ventricular Rate:  57 PR Interval:    QRS Duration: 109 QT Interval:  444 QTC Calculation: 433 R Axis:   8 Text Interpretation: sinus rhythm Incomplete left bundle branch block Consider anterior infarct No significant change since last tracing Confirmed by Dorie Rank [phone]) on 01/15/2020 2:37:15 PM   Radiology DG Chest 2 View  Result Date: 01/15/2020 CLINICAL DATA:  Chest pain EXAM: CHEST - 2 VIEW COMPARISON:  June 10, 2012 FINDINGS: The lungs are clear. Heart size and pulmonary vascularity are normal. No adenopathy. There is a focal hiatal hernia. No pneumothorax. No bone lesions. IMPRESSION: Hiatal hernia.  Lungs clear.  Cardiac silhouette normal. Electronically Signed   By: Lowella Grip III M.D.   On: 01/15/2020 11:50   CT HEAD WO CONTRAST  Result Date: 01/15/2020 CLINICAL DATA:  Altered mental status EXAM: CT HEAD WITHOUT CONTRAST TECHNIQUE: Contiguous axial images were obtained from the base of the skull through the vertex without intravenous contrast. COMPARISON:  None. FINDINGS: Brain: There is age related volume loss. There is no intracranial mass, hemorrhage, extra-axial fluid  collection, or midline shift. There is patchy small vessel disease in the centra semiovale bilaterally. No acute infarct is evident. Vascular: No hyperdense vessel. No appreciable vascular calcification. Skull: The bony calvarium appears intact. Sinuses/Orbits: There is mucosal thickening in several ethmoid air cells. Other paranasal sinuses are clear. Orbits appear symmetric bilaterally. Other: Mastoid air cells are clear. IMPRESSION: Age related volume loss with patchy periventricular small vessel disease. No acute infarct. No mass or hemorrhage. Mucosal thickening noted in several ethmoid air cells. Electronically Signed   By: Lowella Grip III M.D.   On: 01/15/2020 12:31    Procedures Procedures (including critical care time)  Medications Ordered in ED Medications  acetaminophen (TYLENOL) tablet 650 mg (has no administration in time range)  sodium chloride flush (NS) 0.9 % injection 3 mL (3 mLs Intravenous Given 01/15/20 1319)    ED Course  I have reviewed the triage vital signs and the nursing notes.  Pertinent labs & imaging results that were available during my care of the patient were reviewed by me and considered in my medical decision making (see chart for details).  Clinical Course as of Jan 14 1501  Sun Jan 15, 2020  1436 Labs reviewed.  No significant abnormalities.   [JK]    Clinical Course User Index [JK] Dorie Rank, MD   MDM Rules/Calculators/A&P HEAR Score: 5             NIH Stroke Scale: 2       Patient presented with complaints of chest pain and dizziness.  Initially she told her daughter she was having chest discomfort and that is what brought her to the ED.  However when I examined her the patient denied any complaints of chest pain and was complaining of feeling dizzy.  Patient does have dementia and is on medications for that.  That is certainly  a contributing factor in the limited history provided by the patient.  Patient ED work-up was overall reassuring.  Her  heart score is elevated but her serial troponins are normal and the symptoms are very atypical.  I have low suspicion for acute coronary syndrome.  No findings suggest pulmonary embolism.  Patient was having dizziness.  Her NIH stroke scale was 2 but that was because of her memory issues.  She does have history of dementia.  Otherwise her neurologic exam is normal.  Patient is able to walk without difficulty or ataxia.  Head CT is negative.  I discussed the findings with the patient and the daughter.  I certainly cannot exclude the possibility of occult posterior circulation CVA causing her dizziness but I overall have a low suspicion at this point.  Discussed MRI in the ED versus outpatient follow-up considering she is feeling well at this time.  Patient and her daughter are comfortable with discharge and outpatient management. Final Clinical Impression(s) / ED Diagnoses Final diagnoses:  Chest pain, unspecified type  Dizziness    Rx / DC Orders ED Discharge Orders    None       Dorie Rank, MD 01/15/20 1504

## 2020-01-15 NOTE — Discharge Instructions (Addendum)
Follow-up with your doctor next week to be rechecked.  Return to the ED for any worsening symptoms.

## 2020-01-17 ENCOUNTER — Other Ambulatory Visit: Payer: Self-pay | Admitting: Adult Health

## 2020-01-18 ENCOUNTER — Other Ambulatory Visit: Payer: Self-pay | Admitting: Adult Health

## 2020-01-18 MED ORDER — HYDROCHLOROTHIAZIDE 12.5 MG PO TABS: 12.5000 mg | ORAL_TABLET | Freq: Every day | ORAL | 0 refills | Status: DC

## 2020-01-18 NOTE — Telephone Encounter (Signed)
Pt's daughter called to schedule 3 month f/u as stated on LOV avs & to request Rx refill on :   hydrochlorothiazide (HYDRODIURIL) 12.5 MG tablet [id number]   Order Details Dose: 12.5 mg Route: Oral Frequency: Daily  Dispense Quantity: 90 tablet Refills: 0       Sig: Take 1 tablet (12.5 mg total) by mouth daily.       --Forwarding request to med asst to send refill order to :  [id number] [address], [place] [phone] (Phone) [phone] (Fax)   --glh

## 2020-01-18 NOTE — Telephone Encounter (Signed)
#  30 tabs sent to pharmacy per refill protocol. AS, CMA

## 2020-02-01 ENCOUNTER — Other Ambulatory Visit: Payer: Self-pay | Admitting: Adult Health

## 2020-02-01 ENCOUNTER — Telehealth: Payer: Self-pay

## 2020-02-01 NOTE — Telephone Encounter (Signed)
Please call pt to schedule appt.  No further refills until pt is seen.  T. Denna Fryberger, CMA  

## 2020-02-02 ENCOUNTER — Ambulatory Visit (INDEPENDENT_AMBULATORY_CARE_PROVIDER_SITE_OTHER): Payer: Medicare HMO | Admitting: Physician Assistant

## 2020-02-02 ENCOUNTER — Other Ambulatory Visit: Payer: Self-pay

## 2020-02-02 ENCOUNTER — Encounter: Payer: Self-pay | Admitting: Physician Assistant

## 2020-02-02 VITALS — BP 134/80 | HR 74 | Temp 97.9°F | Ht 63.0 in | Wt 187.9 lb

## 2020-02-02 DIAGNOSIS — M25562 Pain in left knee: Secondary | ICD-10-CM | POA: Diagnosis not present

## 2020-02-02 DIAGNOSIS — R7989 Other specified abnormal findings of blood chemistry: Secondary | ICD-10-CM

## 2020-02-02 DIAGNOSIS — F0390 Unspecified dementia without behavioral disturbance: Secondary | ICD-10-CM

## 2020-02-02 DIAGNOSIS — R69 Illness, unspecified: Secondary | ICD-10-CM | POA: Diagnosis not present

## 2020-02-02 DIAGNOSIS — G8929 Other chronic pain: Secondary | ICD-10-CM | POA: Diagnosis not present

## 2020-02-02 DIAGNOSIS — R600 Localized edema: Secondary | ICD-10-CM

## 2020-02-02 DIAGNOSIS — M79642 Pain in left hand: Secondary | ICD-10-CM

## 2020-02-02 DIAGNOSIS — I10 Essential (primary) hypertension: Secondary | ICD-10-CM

## 2020-02-02 DIAGNOSIS — M25561 Pain in right knee: Secondary | ICD-10-CM | POA: Diagnosis not present

## 2020-02-02 DIAGNOSIS — Z8659 Personal history of other mental and behavioral disorders: Secondary | ICD-10-CM

## 2020-02-02 MED ORDER — DICLOFENAC SODIUM 1 % EX GEL: 2.0000 g | Freq: Four times a day (QID) | CUTANEOUS | 2 refills | Status: DC

## 2020-02-02 MED ORDER — HYDROCHLOROTHIAZIDE 12.5 MG PO TABS: 12.5000 mg | ORAL_TABLET | Freq: Every day | ORAL | 1 refills | Status: DC

## 2020-02-02 NOTE — Patient Instructions (Signed)
Dementia °Dementia is a condition that affects the way the brain works. It often affects memory and thinking. There are many types of dementia. Some types get worse with time and cannot be reversed. Some types of dementia include: °· Alzheimer's disease. This is the most common type. °· Vascular dementia. This type may happen due to a stroke. °· Lewy body dementia. This type may happen to people who have Parkinson's disease. °· Frontotemporal dementia. This type is caused by damage to nerve cells in certain parts of the brain. °Some people may have more than one type, and this is called mixed dementia. °What are the causes? °This condition is caused by damage to cells in the brain. Some causes that cannot be reversed include: °· Having a condition that affects the blood vessels of the brain, such as diabetes, heart disease, or blood vessel disease. °· Changes to genes. °Some causes that can be reversed or slowed include: °· Injury to the brain. °· Certain medicines. °· Infection. °· Not having enough vitamin B12 in the body, or thyroid problems. °· A tumor or blood clot in the brain. °What are the signs or symptoms? °Symptoms depend on the type of dementia. This may include: °· Problems remembering things. °· Having trouble taking a bath or putting clothes on. °· Forgetting appointments. °· Forgetting to pay bills. °· Trouble planning and making meals. °· Having trouble speaking. °· Getting lost easily. °How is this treated? °Treatment depends on the cause of the dementia. It might include taking medicines that help: °· To control the dementia. °· To slow down the dementia. °· To manage symptoms. °In some cases, treating the cause of your dementia can improve symptoms, reverse symptoms, or slow down how quickly it gets worse. Your doctor can help you find support groups and other doctors who can help with your care. °Follow these instructions at home: °Medicines °· Take over-the-counter and prescription medicines  only as told by your doctor. °· Use a pill organizer to help you manage your medicines. °· Avoidtaking medicines for pain or for sleep. °Lifestyle °· Make healthy choices: °? Be active as told by your doctor. °? Do not use any products that contain nicotine or tobacco, such as cigarettes, e-cigarettes, and chewing tobacco. If you need help quitting, ask your doctor. °? Do not drink alcohol. °? When you get stressed, do something that will help you to relax. Your doctor can give you tips. °? Spend time with other people. °· Make sure you get good sleep. To get good sleep: °? Try not to take naps during the day. °? Keep your bedroom dark and cool. °? In the few hours before you go to bed, try not to do any exercise. °? Do not have foods and drinks with caffeine at night. °Eating and drinking °· Drink enough fluid to keep your pee (urine) pale yellow. °· Eat a healthy diet. °General instructions ° °· Talk with your doctor to figure out: °? What you need help with. °? What your safety needs are. °· Ask your doctor if it is safe for you to drive. °· If told, wear a bracelet that tracks where you are or shows that you are a person with memory loss. °· Work with your family to make big decisions. °· Keep all follow-up visits as told by your doctor. This is important. °Contact a doctor if: °· You have any new symptoms. °· Your symptoms get worse. °· You have problems with swallowing or choking. °Get help   right away if:  You feel very sad, or feel that you want to harm yourself.  You or your family members are worried for your safety. If you ever feel like you may hurt yourself or others, or have thoughts about taking your own life, get help right away. You can go to your nearest emergency department or call:  Your local emergency services (911 in the [place]  A suicide crisis helpline, such as the National Suicide Prevention Lifeline at 1-800-273-8255. This is open 24 hours a day. Summary  Dementia often affects  memory and thinking.  Some types of dementia get worse with time and cannot be reversed.  Treatment for this condition depends on the cause.  Talk with your doctor to figure out what you need help with.  Your doctor can help you find support groups and other doctors who can help with your care. This information is not intended to replace advice given to you by your health care provider. Make sure you discuss any questions you have with your health care provider. Document Revised: 12/14/2018 Document Reviewed: 12/14/2018 Elsevier Patient Education  2020 [employer] Inc[employer]   Dementia Caregiver Guide Dementia is a term used to describe a number of symptoms that affect memory and thinking. The most common symptoms include:  Memory loss.  Trouble with language and communication.  Trouble concentrating.  Poor judgment.  Problems with reasoning.  Child-like behavior and language.  Extreme anxiety.  Angry outbursts.  Wandering from home or public places. Dementia usually gets worse slowly over time. In the early stages, people with dementia can stay independent and safe with some help. In later stages, they need help with daily tasks such as dressing, grooming, and using the bathroom. How to help the person with dementia cope Dementia can be frightening and confusing. Here are some tips to help the person with dementia cope with changes caused by the disease. General tips  Keep the person on track with his or her routine.  Try to identify areas where the person may need help.  Be supportive, patient, calm, and encouraging.  Gently remind the person that adjusting to changes takes time.  Help with the tasks that the person has asked for help with.  Keep the person involved in daily tasks and decisions as much as possible.  Encourage conversation, but try not to get frustrated or harried if the person struggles to find words or does not seem to appreciate your  help. Communication tips  When the person is talking or seems frustrated, make eye contact and hold the person's hand.  Ask specific questions that need yes or no answers.  Use simple words, short sentences, and a calm voice. Only give one direction at a time.  When offering choices, limit them to just 1 or 2.  Avoid correcting the person in a negative way.  If the person is struggling to find the right words, gently try to help him or her. How to recognize symptoms of stress Symptoms of stress in caregivers include:  Feeling frustrated or angry with the person with dementia.  Denying that the person has dementia or that his or her symptoms will not improve.  Feeling hopeless and unappreciated.  Difficulty sleeping.  Difficulty concentrating.  Feeling anxious, irritable, or depressed.  Developing stress-related health problems.  Feeling like you have too little time for your own life. Follow these instructions at home:   Make sure that you and the person you are caring for: ? Get regular   sleep. ? Exercise regularly. ? Eat regular, nutritious meals. ? Drink enough fluid to keep your urine clear or pale yellow. ? Take over-the-counter and prescription medicines only as told by your health care providers. ? Attend all scheduled health care appointments.  Join a support group with others who are caregivers.  Ask about respite care resources so that you can have a regular break from the stress of caregiving.  Look for signs of stress in yourself and in the person you are caring for. If you notice signs of stress, take steps to manage it.  Consider any safety risks and take steps to avoid them.  Organize medications in a pill box for each day of the week.  Create a plan to handle any legal or financial matters. Get legal or financial advice if needed.  Keep a calendar in a central location to remind the person of appointments or other activities. Tips for reducing  the risk of injury  Keep floors clear of clutter. Remove rugs, magazine racks, and floor lamps.  Keep hallways well lit, especially at night.  Put a handrail and nonslip mat in the bathtub or shower.  Put childproof locks on cabinets that contain dangerous items, such as medicines, alcohol, guns, toxic cleaning items, sharp tools or utensils, matches, and lighters.  Put the locks in places where the person cannot see or reach them easily. This will help ensure that the person does not wander out of the house and get lost.  Be prepared for emergencies. Keep a list of emergency phone numbers and addresses in a convenient area.  Remove car keys and lock garage doors so that the person does not try to get in the car and drive.  Have the person wear a bracelet that tracks locations and identifies the person as having memory problems. This should be worn at all times for safety. Where to find support: Many individuals and organizations offer support. These include:  Support groups for people with dementia and for caregivers.  Counselors or therapists.  Home health care services.  Adult day care centers. Where to find more information Alzheimer's Association: www.alz.org Contact a health care provider if:  The person's health is rapidly getting worse.  You are no longer able to care for the person.  Caring for the person is affecting your physical and emotional health.  The person threatens himself or herself, you, or anyone else. Summary  Dementia is a term used to describe a number of symptoms that affect memory and thinking.  Dementia usually gets worse slowly over time.  Take steps to reduce the person's risk of injury, and to plan for future care.  Caregivers need support, relief from caregiving, and time for their own lives. This information is not intended to replace advice given to you by your health care provider. Make sure you discuss any questions you have with your  health care provider. Document Revised: 09/11/2017 Document Reviewed: 09/02/2016 Elsevier Patient Education  2020 [employer]vier Inc.  

## 2020-02-02 NOTE — Progress Notes (Signed)
Established Patient Office Visit  Subjective:  Patient ID: Darlene Ballard, female    DOB: 06/11/1946  Age: 74 y.o. MRN: [id number]  CC:  Chief Complaint  Patient presents with  . Hypertension    HPI Darlene Ballard presents for chronic follow-up on HTN and elevated TSH. Pt is accompanied by her daughter. Pt reports she discontinued Aricept that was started by her neurologist because she feels like that was making her have side effects and triggering panic attacks. States she went to ED on 01/15/20 for chest pain and her work-up was reassuring for low risk of an acute coronary syndrome. Pt currently denies chest pain or palpitations. She also complains of chronic bilateral knee pain. Reports she has seen orthopedics in the past and had knee injections, which weren't very successful. Pt also reports intermittent left hand/wrist pain.  Past Medical History:  Diagnosis Date  . Memory loss     Past Surgical History:  Procedure Laterality Date  . TONSILLECTOMY AND ADENOIDECTOMY      Family History  Problem Relation Age of Onset  . Alzheimer's disease Mother   . Heart disease Mother   . Heart attack Mother   . Other Father        blood disorder - unsure of condition    Social History   Socioeconomic History  . Marital status: Married    Spouse name: Not on file  . Number of children: 1  . Years of education: 2 years college  . Highest education level: Not on file  Occupational History  . Occupation: Retired  Tobacco Use  . Smoking status: Never Smoker  . Smokeless tobacco: Never Used  Substance and Sexual Activity  . Alcohol use: Never  . Drug use: Never  . Sexual activity: Not Currently  Other Topics Concern  . Not on file  Social History Narrative   Lives at home with husband.   Right-handed.   2 cups caffeine per day.   Social Determinants of Health   Financial Resource Strain:   . Difficulty of Paying Living Expenses:   Food Insecurity:   . Worried About  Charity fundraiser in the Last Year:   . Arboriculturist in the Last Year:   Transportation Needs:   . Film/video editor (Medical):   Marland Kitchen Lack of Transportation (Non-Medical):   Physical Activity:   . Days of Exercise per Week:   . Minutes of Exercise per Session:   Stress:   . Feeling of Stress :   Social Connections:   . Frequency of Communication with Friends and Family:   . Frequency of Social Gatherings with Friends and Family:   . Attends Religious Services:   . Active Member of Clubs or Organizations:   . Attends Archivist Meetings:   Marland Kitchen Marital Status:   Intimate Partner Violence:   . Fear of Current or Ex-Partner:   . Emotionally Abused:   Marland Kitchen Physically Abused:   . Sexually Abused:     Outpatient Medications Prior to Visit  Medication Sig Dispense Refill  . ASPIRIN 81 PO Take 81 mg by mouth daily.    . memantine (NAMENDA) 10 MG tablet Take 1 tablet (10 mg total) by mouth 2 (two) times daily. 180 tablet 4  . Multiple Vitamin (MULTIVITAMIN) tablet Take 1 tablet by mouth daily.    Marland Kitchen levothyroxine (SYNTHROID) 25 MCG tablet TAKE 1 AND 1/2 TAB PER DAY=37.5 MG PER DAY 45 tablet 0  .  donepezil (ARICEPT) 10 MG tablet Take 1 tablet (10 mg total) by mouth at bedtime. 30 tablet 11  . hydrochlorothiazide (HYDRODIURIL) 12.5 MG tablet Take 1 tablet (12.5 mg total) by mouth daily. (Patient not taking: Reported on 02/02/2020) 30 tablet 0   No facility-administered medications prior to visit.    No Known Allergies  ROS Review of Systems  Review of Systems:  A fourteen system review of systems was performed and found to be positive as per HPI.  Objective:    Physical Exam General:  Well Developed, well nourished, appropriate for stated age.  Neuro:  Alert and oriented,  extra-ocular muscles intact  HEENT:  Normocephalic, atraumatic, neck supple, no carotid bruits appreciated  Skin:  no gross rash, warm, pink. Cardiac:  RRR, S1 S2 Respiratory:  ECTA B/L and A/P,  Not using accessory muscles, speaking in full sentences- unlabored. MSK: LE edema noted, when standing mild genu valgum noted. No tenderness to palpation, warmth, or erythema of left hand. Good ROM. B/L knee swelling noted. Vascular:  Ext warm, no cyanosis apprec.; cap RF less 2 sec. Psych:  No HI/SI, judgement and insight stable, Euthymic mood.   BP 134/80   Pulse 74   Temp 97.9 F (36.6 C) (Oral)   Ht 5\' 3"  (1.6 m)   Wt 187 lb 14.4 oz (85.2 kg)   SpO2 98% Comment: on RA  BMI 33.28 kg/m  Wt Readings from Last 3 Encounters:  02/02/20 187 lb 14.4 oz (85.2 kg)  01/15/20 197 lb (89.4 kg)  01/03/20 190 lb (86.2 kg)     Health Maintenance Due  Topic Date Due  . Hepatitis C Screening  Never done  . COVID-19 Vaccine (1) Never done  . COLONOSCOPY  Never done  . MAMMOGRAM  09/08/2008  . DEXA SCAN  Never done  . PNA vac Low Risk Adult (2 of 2 - PPSV23) 10/22/2019    There are no preventive care reminders to display for this patient.  Lab Results  Component Value Date   TSH 6.500 (H) 02/03/2020   Lab Results  Component Value Date   WBC 9.9 01/15/2020   HGB 14.0 01/15/2020   HCT 42.8 01/15/2020   MCV 95.5 01/15/2020   PLT 197 01/15/2020   Lab Results  Component Value Date   NA 137 01/15/2020   K 3.1 (L) 01/15/2020   CO2 23 01/15/2020   GLUCOSE 117 (H) 01/15/2020   BUN 8 01/15/2020   CREATININE 0.77 01/15/2020   BILITOT 0.2 08/09/2019   ALKPHOS 108 08/09/2019   AST 28 08/09/2019   ALT 18 08/09/2019   PROT 6.8 08/09/2019   ALBUMIN 4.5 08/09/2019   CALCIUM 9.2 01/15/2020   ANIONGAP 14 01/15/2020   Lab Results  Component Value Date   CHOL 208 (H) 12/15/2018   Lab Results  Component Value Date   HDL 74 12/15/2018   Lab Results  Component Value Date   LDLCALC 113 (H) 12/15/2018   Lab Results  Component Value Date   TRIG 107 12/15/2018   Lab Results  Component Value Date   CHOLHDL 2.8 12/15/2018   Lab Results  Component Value Date   HGBA1C 5.3  12/15/2018      Assessment & Plan:   Problem List Items Addressed This Visit      Cardiovascular and Mediastinum   Hypertension - Primary   Relevant Medications   hydrochlorothiazide (HYDRODIURIL) 12.5 MG tablet     Nervous and Auditory   Dementia without behavioral disturbance (HCC)  Other   Elevated TSH   Relevant Medications   levothyroxine (SYNTHROID) 50 MCG tablet   Other Relevant Orders   TSH (Completed)   T4, free (Completed)    Other Visit Diagnoses    History of panic attacks       Chronic pain of both knees       Relevant Medications   diclofenac Sodium (VOLTAREN) 1 % GEL   Left hand pain       Lower extremity edema          Meds ordered this encounter  Medications  . hydrochlorothiazide (HYDRODIURIL) 12.5 MG tablet    Sig: Take 1 tablet (12.5 mg total) by mouth daily.    Dispense:  90 tablet    Refill:  1  . diclofenac Sodium (VOLTAREN) 1 % GEL    Sig: Apply 2 g topically 4 (four) times daily.    Dispense:  100 g    Refill:  2    100-300 g. Whichever is covered by insurance.  Marland Kitchen levothyroxine (SYNTHROID) 50 MCG tablet    Sig: Take 1 tablet (50 mcg total) by mouth daily before breakfast.    Dispense:  30 tablet    Refill:  1    Order Specific Question:   Supervising Provider    AnswerMellody Dance [id number]   Hypertension: - BP today is 134/80, stable. - Continue HCTZ 12.5 mg. Refills sent. - Encouraged to check BP and pulse at home 2-3 times/wk and keep a log. - Continue low sat diet and stay as active as possible. - Stay well hydrated, at least 64 fluid ounces/day.   Elevated TSH: - Pt had levothyroxine dose adjusted 3 months ago and didn't follow-up in 6 weeks for TSH recheck, so will recheck today. - Pending labs will send refills or new rx if dose adjustment necessary. - Will continue to monitor.  Dementia: - Advised daughter to call pt's neurologist [facility]) and let them know about pt self-discontinuing  Aricept because of possible side effects.  Chronic panic of B/L knees: - Patient has seen ortho in the past and had knee injections, which weren't so effective and is not ready for discussing total knee replacement. Pt declined referral at this time and prefers conservative treatment.  - Start anti-inflammatory topical- Voltaren gel up to four times daily as needed for knee pain. - Encourage low impact exercise such as walking.  Left hand pain: - Pt has no tenderness on exam without swelling, contracture, erythema or warmth, negative Finkelstein's test, so most likely arthritis. Per chart review previous PCP had ordered left hand x-ray to evaluate left hand/thumb pain and no imaging done, unclear why. - If symptoms worsen or fail to improve will re-order hand x-ray for further evaluation. - Use heat or apply voltaren gel for pain.  - Will continue to monitor.  H/o panic attacks: - Daughter states patient has a history of panic attacks and most recent episode of chest pain seemed very similar to previous panic attacks. States it has been a long time since pt has had a panic attack. - Pt has discontinued Aricept, and discussed the importance to continue to monitor symptoms and return to clinic if symptoms (chest pain,palpitations, etc.) worsen or fail to improve.  - Patient education provided on signs and symptoms of a panic attack.  Lower Extremity Edema: - Pt has been out of HCTZ medication and noticed increase in LE edema, L>R - Pt states she cannot wear compression  stockings. - Advised leg elevation and low salt diet. - Pt has varicose veins, so most likely due to chronic venous insufficiency.  - Continue diuretic (HCTZ).    Follow-up: Return for HTN  in 3 months.    Lorrene Reid, PA-C

## 2020-02-03 ENCOUNTER — Other Ambulatory Visit: Payer: Medicare HMO

## 2020-02-03 DIAGNOSIS — R7989 Other specified abnormal findings of blood chemistry: Secondary | ICD-10-CM | POA: Diagnosis not present

## 2020-02-04 LAB — TSH: TSH: 6.5 u[IU]/mL — ABNORMAL HIGH (ref 0.450–4.500)

## 2020-02-04 LAB — T4, FREE: Free T4: 0.96 ng/dL (ref 0.82–1.77)

## 2020-02-06 MED ORDER — LEVOTHYROXINE SODIUM 50 MCG PO TABS: 50.0000 ug | ORAL_TABLET | Freq: Every day | ORAL | 1 refills | Status: DC

## 2020-02-07 ENCOUNTER — Other Ambulatory Visit: Payer: Self-pay

## 2020-02-07 ENCOUNTER — Encounter: Payer: Self-pay | Admitting: Physician Assistant

## 2020-02-07 ENCOUNTER — Ambulatory Visit (INDEPENDENT_AMBULATORY_CARE_PROVIDER_SITE_OTHER): Payer: Medicare HMO | Admitting: Physician Assistant

## 2020-02-07 ENCOUNTER — Telehealth: Payer: Self-pay | Admitting: Physician Assistant

## 2020-02-07 DIAGNOSIS — H8110 Benign paroxysmal vertigo, unspecified ear: Secondary | ICD-10-CM

## 2020-02-07 DIAGNOSIS — H65193 Other acute nonsuppurative otitis media, bilateral: Secondary | ICD-10-CM

## 2020-02-07 DIAGNOSIS — J3089 Other allergic rhinitis: Secondary | ICD-10-CM | POA: Diagnosis not present

## 2020-02-07 MED ORDER — LEVOCETIRIZINE DIHYDROCHLORIDE 5 MG PO TABS: 5.0000 mg | ORAL_TABLET | Freq: Every evening | ORAL | 1 refills | Status: DC

## 2020-02-07 MED ORDER — MECLIZINE HCL 25 MG PO TABS: 25.0000 mg | ORAL_TABLET | Freq: Two times a day (BID) | ORAL | 0 refills | Status: DC | PRN

## 2020-02-07 MED ORDER — FLUTICASONE PROPIONATE 50 MCG/ACT NA SUSP: 2.0000 | Freq: Every day | NASAL | 1 refills | Status: DC

## 2020-02-07 MED ORDER — MECLIZINE HCL 25 MG PO TABS: 25.0000 mg | ORAL_TABLET | Freq: Three times a day (TID) | ORAL | 0 refills | Status: DC | PRN

## 2020-02-07 NOTE — Telephone Encounter (Signed)
Please have patient come in for further evaluation- orthostatics, labs. It is ok to work her in this afternoon.

## 2020-02-07 NOTE — Telephone Encounter (Signed)
Pt's daughter called request call back, says Patient is still experiencing dizzy spells---forwarding message to med asst to contact [name] @ [phone] with advise (Is another appt necessary or what is needed to hault dizziness)  --glh

## 2020-02-07 NOTE — Progress Notes (Signed)
Acute Office Visit  Subjective:    Patient ID: Darlene Ballard, female    DOB: Jan 09, 1946, 74 y.o.   MRN: [id number]  Chief Complaint  Patient presents with  . Dizziness    HPI Patient is in today for dizziness. Pt states around lunchtime today she was watching t.v. and got up, then felt dizzy. She had to hold onto the walls. States she feels off-balance, "like the room is spinning" and her head is spinning too. She has felt mildly nauseous throughout the day. PT denies chest pain, palpitations, jaw pain, arm pain or syncope.   Patient has dementia and memory deficits, so history also obtained from daughterLevada Ballard when I spoke to her over the phone. She usually comes with patient to her appointments but was unable to today.  Pt denies allergy symptoms- nasal congestion, runny nose, post nasal drainage, but when speaking to daughter states patient does have allergy symptoms. States she thinks her mom might be allergic to her dogs but patient is very attached to them and also could be related to seasonal allergies.  Past Medical History:  Diagnosis Date  . Memory loss     Past Surgical History:  Procedure Laterality Date  . TONSILLECTOMY AND ADENOIDECTOMY      Family History  Problem Relation Age of Onset  . Alzheimer's disease Mother   . Heart disease Mother   . Heart attack Mother   . Other Father        blood disorder - unsure of condition    Social History   Socioeconomic History  . Marital status: Married    Spouse name: Not on file  . Number of children: 1  . Years of education: 2 years college  . Highest education level: Not on file  Occupational History  . Occupation: Retired  Tobacco Use  . Smoking status: Never Smoker  . Smokeless tobacco: Never Used  Substance and Sexual Activity  . Alcohol use: Never  . Drug use: Never  . Sexual activity: Not Currently  Other Topics Concern  . Not on file  Social History Narrative   Lives at home with husband.    Right-handed.   2 cups caffeine per day.   Social Determinants of Health   Financial Resource Strain:   . Difficulty of Paying Living Expenses:   Food Insecurity:   . Worried About Charity fundraiser in the Last Year:   . Arboriculturist in the Last Year:   Transportation Needs:   . Film/video editor (Medical):   Marland Kitchen Lack of Transportation (Non-Medical):   Physical Activity:   . Days of Exercise per Week:   . Minutes of Exercise per Session:   Stress:   . Feeling of Stress :   Social Connections:   . Frequency of Communication with Friends and Family:   . Frequency of Social Gatherings with Friends and Family:   . Attends Religious Services:   . Active Member of Clubs or Organizations:   . Attends Archivist Meetings:   Marland Kitchen Marital Status:   Intimate Partner Violence:   . Fear of Current or Ex-Partner:   . Emotionally Abused:   Marland Kitchen Physically Abused:   . Sexually Abused:     Outpatient Medications Prior to Visit  Medication Sig Dispense Refill  . ASPIRIN 81 PO Take 81 mg by mouth daily.    . diclofenac Sodium (VOLTAREN) 1 % GEL Apply 2 g topically 4 (four) times daily. 100  g 2  . hydrochlorothiazide (HYDRODIURIL) 12.5 MG tablet Take 1 tablet (12.5 mg total) by mouth daily. 90 tablet 1  . levothyroxine (SYNTHROID) 50 MCG tablet Take 1 tablet (50 mcg total) by mouth daily before breakfast. 30 tablet 1  . memantine (NAMENDA) 10 MG tablet Take 1 tablet (10 mg total) by mouth 2 (two) times daily. 180 tablet 4  . Multiple Vitamin (MULTIVITAMIN) tablet Take 1 tablet by mouth daily.     No facility-administered medications prior to visit.    No Known Allergies  Review of Systems Review of Systems:  A fourteen system review of systems was performed and found to be positive as per HPI.  Objective:    Physical Exam  General:  In no acute distress, gait off-balance.  Neuro:  Alert and oriented,  extra-ocular muscles intact, PERRLA, nystagmus noted with  Dix-Hallpike maneuver.  HEENT:  Normocephalic, atraumatic. B/L ear effusion noted R>L. Negative pinna and tragus sign. Opaque TM's R>L. No signs of perforated TM's. No tenderness noted during exam. Skin:  no gross rash, warm, pink. Cardiac:  RRR Respiratory:  ECTA B/L and A/P, Not using accessory muscles, speaking in full sentences- unlabored. Vascular:  Ext warm, no cyanosis apprec.; cap RF less 2 sec.   There were no vitals taken for this visit. Wt Readings from Last 3 Encounters:  02/02/20 187 lb 14.4 oz (85.2 kg)  01/15/20 197 lb (89.4 kg)  01/03/20 190 lb (86.2 kg)    Health Maintenance Due  Topic Date Due  . Hepatitis C Screening  Never done  . COVID-19 Vaccine (1) Never done  . COLONOSCOPY  Never done  . MAMMOGRAM  09/08/2008  . DEXA SCAN  Never done  . PNA vac Low Risk Adult (2 of 2 - PPSV23) 10/22/2019    There are no preventive care reminders to display for this patient.   Lab Results  Component Value Date   TSH 6.500 (H) 02/03/2020   Lab Results  Component Value Date   WBC 9.9 01/15/2020   HGB 14.0 01/15/2020   HCT 42.8 01/15/2020   MCV 95.5 01/15/2020   PLT 197 01/15/2020   Lab Results  Component Value Date   NA 137 01/15/2020   K 3.1 (L) 01/15/2020   CO2 23 01/15/2020   GLUCOSE 117 (H) 01/15/2020   BUN 8 01/15/2020   CREATININE 0.77 01/15/2020   BILITOT 0.2 08/09/2019   ALKPHOS 108 08/09/2019   AST 28 08/09/2019   ALT 18 08/09/2019   PROT 6.8 08/09/2019   ALBUMIN 4.5 08/09/2019   CALCIUM 9.2 01/15/2020   ANIONGAP 14 01/15/2020   Lab Results  Component Value Date   CHOL 208 (H) 12/15/2018   Lab Results  Component Value Date   HDL 74 12/15/2018   Lab Results  Component Value Date   LDLCALC 113 (H) 12/15/2018   Lab Results  Component Value Date   TRIG 107 12/15/2018   Lab Results  Component Value Date   CHOLHDL 2.8 12/15/2018   Lab Results  Component Value Date   HGBA1C 5.3 12/15/2018       Assessment & Plan:   Problem  List Items Addressed This Visit    None    Visit Diagnoses    Benign paroxysmal positional vertigo, unspecified laterality    -  Primary   Relevant Medications   meclizine (ANTIVERT) 25 MG tablet   Allergic rhinitis due to other allergic trigger, unspecified seasonality       Relevant Medications  levocetirizine (XYZAL) 5 MG tablet   fluticasone (FLONASE) 50 MCG/ACT nasal spray   Acute MEE (middle ear effusion), bilateral         BPPV: - Orthostatics done in office were normal. Pt's symptoms and positive Dix-Hallpike maneuver are consistent with BPPV and likely the cause of her dizziness.  - Ordered meclizine 25 mg and can use BID prn if nausea/dizziness becomes severe. Advised prudent use given potential side effects, pt's age and cognition status. - Pt's daughter wasn't able to come with patient today and I called her after OV to discuss A&P. -Advised if symptoms don't improve in 1-2 weeks to notify us and will consider sending in low dose prednisone and possible referral for vestibular rehabilitation.  Allergic Rhinitis, Acute MEE:  - Bilateral middle ear effusion and post nasal drainage most likely cause of vertigo. - Ordered Xyzal 5mg  and Flonase spray. - If symptoms fail to improve or worsen will consider sending rx for low dose prednisone.  Meds ordered this encounter  Medications  . DISCONTD: meclizine (ANTIVERT) 25 MG tablet    Sig: Take 1 tablet (25 mg total) by mouth 3 (three) times daily as needed for dizziness.    Dispense:  30 tablet    Refill:  0    Order Specific Question:   Supervising Provider    AnswerMellody Dance [id number]  . meclizine (ANTIVERT) 25 MG tablet    Sig: Take 1 tablet (25 mg total) by mouth 2 (two) times daily as needed for dizziness.    Dispense:  30 tablet    Refill:  0    Order Specific Question:   Supervising Provider    AnswerMellody Dance [id number]  . levocetirizine (XYZAL) 5 MG tablet    Sig: Take 1 tablet (5 mg total) by  mouth every evening.    Dispense:  30 tablet    Refill:  1    Order Specific Question:   Supervising Provider    AnswerMellody Dance [id number]  . fluticasone (FLONASE) 50 MCG/ACT nasal spray    Sig: Place 2 sprays into both nostrils daily.    Dispense:  16 g    Refill:  1    Order Specific Question:   Supervising Provider    AnswerMellody Dance [id number]     Lorrene Reid, PA-C

## 2020-02-07 NOTE — Patient Instructions (Addendum)
How to Treat Vertigo at Home with Exercises  What is Vertigo?  Vertigo is a relatively common symptom most often associated with conditions such as sinusitis (inflammation of your sinuses due to viruses, allergies, or bacterial infections), or an inner ear infection or ear trauma.   It can be brought on by trauma (e.g. a blow to the head or whiplash) or more serious things like minor strokes.   Symptoms can also be brought on by normal degenerative changes to your inner ear that occur with aging.  The condition tends to be more commonly seen in the elderly but it can occur in all ages.    Patients most often complain of dizziness, as if the room is spinning around them.   Symptoms are provoked by quick head movements or changes in position like going from standing to lying in bed, or even turning over in bed.   It may present with nausea and/or vomiting, and can be very debilitating to some folks.    By far the most common cause, known as Benign Paroxysmal Positional Vertigo (BPPV), is categorized by a sudden onset of symptoms, that are intense but short-lived (60 seconds or less), which is triggered by a change in head position.   Symptoms usually dissipate if you stay in one position and do not move your head.   Within the inner ear are collections of calcium carbonate crystals referred to as "otoliths" which may become dislodged from their normal position and migrate into the semicircular canals of the inner ear, throwing off your body's ability to sense where you are in space.     Fig. 921 Anatomy of the Right Osseous Labyrinth. Antonieta Iba. Anatomy of the Human Body. 1918.            What Else Could Be Behind My Vertigo?  Some other causes of vertigo include:  Meniere's disease (disorder of inner ear with ringing in ears, feeling of fullness/pressure within ear, and fluctuating hearing loss) Tumours Neurological disorders e.g. Multiple Sclerosis Motion Sickness (lack of coordination  between visual stimuli, inner ear balance and positional sense) Migraine Labyrinthitis (inflammation of the fluid-filled tubes and sacs within the inner ear; may also be associated with changes in hearing) Vestibular neuritis (inflammation of the nerves associated with transmission of sensory info from the inner ear; usually of viral origins)  How it can be treated/cured? While certain medications have been prescribed for vertigo including Lorazepam your doing well 7 house the house going organizing and getting things ready for sale with the and Meclizine (for motion sickness), there exists no evidence to support a recommendation of any medication in the routine treatment of BPPV.  Clinical trials have demonstrated that repositioning techniques (listed below) are a superior option for management Otis Dials et al., 2008).    Figure above:  (A) Instructions for the modified Epley procedure (MEP) for left ear posterior canal benign paroxysmal positional vertigo (PC-BPPV). For right ear BPPV, the procedure has to be performed in the opposite direction, starting with the head turned to the right side.  1. Start by sitting on a bed with your head turned 45 to the left. Place a pillow behind you so that on lying back it will be under your shoulders.  2. Lie back quickly with shoulders on the pillow, neck extended, and head resting on the bed. In this position, the affected (left) ear is underneath. Wait for 30 secondS.  3. Turn your head 90 to the right (without raising it), and  wait again for 30 seconds.  °4. Turn your body and head another 90° to the right, and wait for another 30 seconds.  °5. Sit up on the right side. This maneuver should be performed three times a day. Repeat this daily until you are free from positional vertigo for 24 hours. ° ° (B) Instructions for the modified Semont maneuver (MSM) for left ear PC-BPPV. For right ear BPPV, the maneuver has to be performed in the opposite direction,  starting with the head turned toward the left ear.  °1. Sit upright on a bed with your head turned 45° toward the right ear.  °2. Drop quickly to the left side, so that your head touches the bed behind your left ear. Wait 30 seconds.  °3. Move head and trunk in a swift movement toward the other side without stopping in the upright position, so that your head comes to rest on the right side of your forehead. Wait again for 30 seconds.  °4. Sit up again.  °This maneuver should be performed three times a day. Repeat this daily until you are free from positional vertigo symptoms for 24 hours.  ° °(   See the video in the supplementary material on the NeurologyWeb site; go to http://www.neurology.org/content/63/1/150/F1.expansion.html   ) ° ° ° ° °You can also try this motion at home as well- °Self-Treatment of Benign Paroxysmal Positional Vertigo °Benign Paroxysmal Positioning Vertigo is caused by loose inner ear crystals in the inner ear that migrate while sleeping to the back-bottom inner ear balance canal, the so-called “posterior semi-circular canal.” The maneuver demonstrated below is the way to reposition the loose crystals so that the symptoms caused by the loose crystals go away. You may have a floating, swaying sense while walking or sitting for a few days after this procedure.  ° ° ° ° ° ° ° ° ° ° ° ° ° ° °Benign Positional Vertigo °Vertigo is the feeling that you or your surroundings are moving when they are not. Benign positional vertigo is the most common form of vertigo. This is usually a harmless condition (benign). This condition is positional. This means that symptoms are triggered by certain movements and positions. °This condition can be dangerous if it occurs while you are doing something that could cause harm to you or others. This includes activities such as driving or operating machinery. °What are the causes? °In many cases, the cause of this condition is not known. It may be caused by a  disturbance in an area of the inner ear that helps your brain to sense movement and balance. This disturbance can be caused by: °· Viral infection (labyrinthitis). °· Head injury. °· Repetitive motion, such as jumping, dancing, or running. °What increases the risk? °You are more likely to develop this condition if: °· You are a woman. °· You are 50 years of age or older. °What are the signs or symptoms? °Symptoms of this condition usually happen when you move your head or your eyes in different directions. Symptoms may start suddenly, and usually last for less than a minute. They include: °· Loss of balance and falling. °· Feeling like you are spinning or moving. °· Feeling like your surroundings are spinning or moving. °· Nausea and vomiting. °· Blurred vision. °· Dizziness. °· Involuntary eye movement (nystagmus). °Symptoms can be mild and cause only minor problems, or they can be severe and interfere with daily life. Episodes of benign positional vertigo may return (recur) over time. Symptoms may improve   over time. °How is this diagnosed? °This condition may be diagnosed based on: °· Your medical history. °· Physical exam of the head, neck, and ears. °· Tests, such as: °? MRI. °? CT scan. °? Eye movement tests. Your health care provider may ask you to change positions quickly while he or she watches you for symptoms of benign positional vertigo, such as nystagmus. Eye movement may be tested with a variety of exams that are designed to evaluate or stimulate vertigo. °? An electroencephalogram (EEG). This records electrical activity in your brain. °? Hearing tests. °You may be referred to a health care provider who specializes in ear, nose, and throat (ENT) problems (otolaryngologist) or a provider who specializes in disorders of the nervous system (neurologist). °How is this treated? ° °This condition may be treated in a session in which your health care provider moves your head in specific positions to adjust  your inner ear back to normal. Treatment for this condition may take several sessions. Surgery may be needed in severe cases, but this is rare.  °In some cases, benign positional vertigo may resolve on its own in 2-4 weeks. °Follow these instructions at home: °Safety °· Move slowly. Avoid sudden body or head movements or certain positions, as told by your health care provider. °· Avoid driving until your health care provider says it is safe for you to do so. °· Avoid operating heavy machinery until your health care provider says it is safe for you to do so. °· Avoid doing any tasks that would be dangerous to you or others if vertigo occurs. °· If you have trouble walking or keeping your balance, try using a cane for stability. If you feel dizzy or unstable, sit down right away. °· Return to your normal activities as told by your health care provider. Ask your health care provider what activities are safe for you. °General instructions °· Take over-the-counter and prescription medicines only as told by your health care provider. °· Drink enough fluid to keep your urine pale yellow. °· Keep all follow-up visits as told by your health care provider. This is important. °Contact a health care provider if: °· You have a fever. °· Your condition gets worse or you develop new symptoms. °· Your family or friends notice any behavioral changes. °· You have nausea or vomiting that gets worse. °· You have numbness or a "pins and needles" sensation. °Get help right away if you: °· Have difficulty speaking or moving. °· Are always dizzy. °· Faint. °· Develop severe headaches. °· Have weakness in your legs or arms. °· Have changes in your hearing or vision. °· Develop a stiff neck. °· Develop sensitivity to light. °Summary °· Vertigo is the feeling that you or your surroundings are moving when they are not. Benign positional vertigo is the most common form of vertigo. °· The cause of this condition is not known. It may be caused by  a disturbance in an area of the inner ear that helps your brain to sense movement and balance. °· Symptoms include loss of balance and falling, feeling that you or your surroundings are moving, nausea and vomiting, and blurred vision. °· This condition can be diagnosed based on symptoms, physical exam, and other tests, such as MRI, CT scan, eye movement tests, and hearing tests. °· Follow safety instructions as told by your health care provider. You will also be told when to contact your health care provider in case of problems. °This information is not intended to   replace advice given to you by your health care provider. Make sure you discuss any questions you have with your health care provider. °Document Revised: 03/10/2018 Document Reviewed: 03/10/2018 °Elsevier Patient Education © 2020 [employer]vier Inc. ° ° °

## 2020-02-07 NOTE — Telephone Encounter (Signed)
Pt's daughter informed to have pt in our office at [place].  Daughter states that she is unable to bring the patient, but will have her son bring her.  Daughter would like a call after visit to discuss pt's health.  Charyl Bigger, CMA

## 2020-02-07 NOTE — Telephone Encounter (Signed)
Pt c/o persistent dizziness upon standing.  Pt states that she does not experience this while sitting, but occurs acutely upon standing and does not resolve until she sits back down  Pt also states that she is experiencing a headache in the back of her head.  Pt denies CP, SOB, neck/arm/jaw pain, extremity weakness or numbness or tingling, slurred speech, or facial drooping.  Please advise.  Charyl Bigger, CMA

## 2020-02-08 DIAGNOSIS — G8929 Other chronic pain: Secondary | ICD-10-CM | POA: Insufficient documentation

## 2020-02-22 ENCOUNTER — Other Ambulatory Visit: Payer: Self-pay | Admitting: Physician Assistant

## 2020-03-13 ENCOUNTER — Telehealth: Payer: Self-pay | Admitting: Physician Assistant

## 2020-03-13 NOTE — Telephone Encounter (Signed)
Pt's daughter/[name] (who has POA & DPR) says pt's dementia is getting worst   -- Per daughter pt has taken large amounts of cash out of bank & is unable to tell what she did w/it &is accusing her of stealing it,pt is not taking her meds appropriately & is acting out.   Patient's daughter/[name] ask if someone can call back w/ advice -- Is there anything Provider can suggest? ---pt has seen a Neurologist:   Office Visit 08/09/2019 [facility]   Marcial Pacas, MD Neurology  Dementia without behavioral disturbance, unspecified dementia type ([place]) +1 more Dx  Memory Loss . PCP ; Referred by Esaw Grandchild, NP Reason for Visit    --Forwarding request to med asst for review w/provider & contact Daughter/ [name] @ [phone].  -glh

## 2020-03-14 NOTE — Telephone Encounter (Signed)
Spoke with Herb Grays who suggests patient contact Neurologist and advise of behavior change and schedule apt for evaluation.   Spoke with [name] who was advised of the above and verbalized understanding. AS, CMA

## 2020-03-19 ENCOUNTER — Telehealth: Payer: Self-pay | Admitting: Neurology

## 2020-03-19 NOTE — Telephone Encounter (Signed)
Pt's daughter is stating that mother's dementia is getting worse. She states that she has stopped talking her medicine, is paranoid and will not speak to her family. Please give a call at [phone] to discuss.

## 2020-03-19 NOTE — Telephone Encounter (Addendum)
I returned the call to the [facility]. She has noticed a drastic decline in her mother's memory and behavior. She has been isolating herself from her daughter and grandchildren - sometimes talking to them and sometimes not. She recently went to the bank and withdrew all her money because she felt her family was trying to steal from her. Her daughter was able to return it to the account so her house payment could be made. She lives with her husband but her daughter says it is not a good relationship. She says they have slept in separate rooms for years but neither wanted to move out. She does not believe her mother is taking her medications correctly.  Darlene Ballard is concerned about the patient's rapid decline and safety. She is requesting an earlier appt. The patient has been rescheduled to 03/21/20 at 3:15pm. She will check-in at 2:45pm.  Her daughter will be present at the appt. She asked that I call her mother with the new appt information but not reveal this phone call. I have made the call and left her mother a message.

## 2020-03-21 ENCOUNTER — Encounter: Payer: Self-pay | Admitting: Neurology

## 2020-03-21 ENCOUNTER — Ambulatory Visit: Payer: Medicare HMO | Admitting: Neurology

## 2020-03-21 VITALS — BP 142/77 | HR 60 | Ht 62.0 in | Wt 189.0 lb

## 2020-03-21 DIAGNOSIS — F0390 Unspecified dementia without behavioral disturbance: Secondary | ICD-10-CM

## 2020-03-21 DIAGNOSIS — R69 Illness, unspecified: Secondary | ICD-10-CM | POA: Diagnosis not present

## 2020-03-21 MED ORDER — CITALOPRAM HYDROBROMIDE 10 MG PO TABS: 10.0000 mg | ORAL_TABLET | Freq: Every day | ORAL | 5 refills | Status: DC

## 2020-03-21 NOTE — Progress Notes (Signed)
PATIENT: Darlene Ballard DOB: [birth year]  REASON FOR VISIT: follow up HISTORY FROM: patient  HISTORY OF PRESENT ILLNESS: Today 03/21/20  HISTORY Darlene Ballard is a 74 year old female, seen in request by her primary care physician nurse practitioner Mina Marble for evaluation of memory loss, she is accompanied by her daughter Darlene Ballard at today's clinic visit on August 09, 2019.  I have reviewed and summarized the referring note from the referring physician.  She had a previous history of hypothyroidism, on supplement, but has stopped the thyroid supplement, and her Xanax abruptly around 2017, while helping her mother dealing with dementia.  She has strong family history of dementia, her mother, and a few maternal aunt suffered dementia.  She retired as a Dispensing optician at age 74, lives with her husband, has strained relationship, she was noted to have gradual onset memory loss since 2019, significant worsening since 2020.  She tends to repeat herself, got lost while driving sometimes, she has good appetite, sleeps well.  UPDATE January 03 2020: There was no significant change in her memory, Mini-Mental Status Examination 16 out of 30 today, she enjoys taking care of many dogs and cats at home, she has mild gait abnormality due to bilateral knee pain  I personally reviewed MRI of the brain in November 2020, no acute abnormality, mild supratentorium small vessel disease  Laboratory evaluations in 2021, negative RPR, B12, CMP, CBC, elevated TSH, her thyroid supplement dose has increased the symptoms  Update March 21, 2020 SS: Here today with her daughter, Darlene Ballard.  She lives with her husband, has been a strained relationship for years.  About 1 month ago, she stopped taking all of her medications, coincided around the time her daughter suggested she no longer drive.  Around this time, she withdrew money from her checking account to buy her own car, daughter found out about this, return the  money, she mended relationship with her husband, they went and bought a car together.  She has behind on her house payment since 2020, daughter has been trying to help catch this up.  No clear reason why she stopped her medications.  Daughter has noticed a big difference since off medications.  Could not tolerate Aricept, resulted in panic attack, she went to the ER complaining of chest pain.  Denies falls, indicates she sleeps well, does all of her own ADLs, housework/laundry, mows the lawn with a push mower, stays busy with 9 dogs.  Denies any pain, no urinary symptoms.  Seeing PCP tomorrow to check blood work.  REVIEW OF SYSTEMS: Out of a complete 14 system review of symptoms, the patient complains only of the following symptoms, and all other reviewed systems are negative.  Memory loss  ALLERGIES: No Known Allergies  HOME MEDICATIONS: Outpatient Medications Prior to Visit  Medication Sig Dispense Refill  . ASPIRIN 81 PO Take 81 mg by mouth daily.    . diclofenac Sodium (VOLTAREN) 1 % GEL Apply 2 g topically 4 (four) times daily. 100 g 2  . fluticasone (FLONASE) 50 MCG/ACT nasal spray Place 2 sprays into both nostrils daily. 16 g 1  . hydrochlorothiazide (HYDRODIURIL) 12.5 MG tablet Take 1 tablet (12.5 mg total) by mouth daily. 90 tablet 1  . levocetirizine (XYZAL) 5 MG tablet Take 1 tablet (5 mg total) by mouth every evening. 30 tablet 1  . levothyroxine (SYNTHROID) 50 MCG tablet Take 1 tablet (50 mcg total) by mouth daily before breakfast. 30 tablet 1  . meclizine (ANTIVERT)  25 MG tablet Take 1 tablet (25 mg total) by mouth 2 (two) times daily as needed for dizziness. 30 tablet 0  . memantine (NAMENDA) 10 MG tablet Take 1 tablet (10 mg total) by mouth 2 (two) times daily. 180 tablet 4  . Multiple Vitamin (MULTIVITAMIN) tablet Take 1 tablet by mouth daily.     No facility-administered medications prior to visit.    PAST MEDICAL HISTORY: Past Medical History:  Diagnosis Date  . Memory  loss     PAST SURGICAL HISTORY: Past Surgical History:  Procedure Laterality Date  . TONSILLECTOMY AND ADENOIDECTOMY      FAMILY HISTORY: Family History  Problem Relation Age of Onset  . Alzheimer's disease Mother   . Heart disease Mother   . Heart attack Mother   . Other Father        blood disorder - unsure of condition    SOCIAL HISTORY: Social History   Socioeconomic History  . Marital status: Married    Spouse name: Not on file  . Number of children: 1  . Years of education: 2 years college  . Highest education level: Not on file  Occupational History  . Occupation: Retired  Tobacco Use  . Smoking status: Never Smoker  . Smokeless tobacco: Never Used  Substance and Sexual Activity  . Alcohol use: Never  . Drug use: Never  . Sexual activity: Not Currently  Other Topics Concern  . Not on file  Social History Narrative   Lives at home with husband.   Right-handed.   2 cups caffeine per day.   Social Determinants of Health   Financial Resource Strain:   . Difficulty of Paying Living Expenses:   Food Insecurity:   . Worried About Charity fundraiser in the Last Year:   . Arboriculturist in the Last Year:   Transportation Needs:   . Film/video editor (Medical):   Marland Kitchen Lack of Transportation (Non-Medical):   Physical Activity:   . Days of Exercise per Week:   . Minutes of Exercise per Session:   Stress:   . Feeling of Stress :   Social Connections:   . Frequency of Communication with Friends and Family:   . Frequency of Social Gatherings with Friends and Family:   . Attends Religious Services:   . Active Member of Clubs or Organizations:   . Attends Archivist Meetings:   Marland Kitchen Marital Status:   Intimate Partner Violence:   . Fear of Current or Ex-Partner:   . Emotionally Abused:   Marland Kitchen Physically Abused:   . Sexually Abused:    PHYSICAL EXAM  Vitals:   03/21/20 1513  BP: (!) 142/77  Pulse: 60  Weight: 189 lb (85.7 kg)  Height: 5\' 2"   (1.575 m)   Body mass index is 34.57 kg/m.  Generalized: Well developed, in no acute distress  MMSE - Mini Mental State Exam 03/21/2020 01/03/2020 08/09/2019  Orientation to time 2 0 3  Orientation to Place 3 3 4   Registration 3 3 3   Attention/ Calculation 1 1 1   Recall 0 0 0  Language- name 2 objects 2 2 2   Language- repeat 1 1 1   Language- follow 3 step command 3 3 3   Language- read & follow direction 1 1 1   Write a sentence 1 1 1   Copy design 1 1 1   Total score 18 16 20     Neurological examination  Mentation: Alert, oriented to self, place, appears irritable, reluctant  to answer questions, follows commands well, speech is clear Cranial nerve II-XII: Pupils were equal round reactive to light. Extraocular movements were full, visual field were full on confrontational test. Facial sensation and strength were normal. Head turning and shoulder shrug were normal and symmetric. Motor: The motor testing reveals 5 over 5 strength of all 4 extremities. Good symmetric motor tone is noted throughout.  Sensory: Sensory testing is intact to soft touch on all 4 extremities. No evidence of extinction is noted.  Coordination: Cerebellar testing reveals good finger-nose-finger and heel-to-shin bilaterally.  Gait and station: Gait is normal.  Reflexes: Deep tendon reflexes are symmetric and normal bilaterally.   DIAGNOSTIC DATA (LABS, IMAGING, TESTING) - I reviewed patient records, labs, notes, testing and imaging myself where available.  Lab Results  Component Value Date   WBC 9.9 01/15/2020   HGB 14.0 01/15/2020   HCT 42.8 01/15/2020   MCV 95.5 01/15/2020   PLT 197 01/15/2020      Component Value Date/Time   NA 137 01/15/2020 1138   NA 142 08/09/2019 1554   K 3.1 (L) 01/15/2020 1138   CL 100 01/15/2020 1138   CO2 23 01/15/2020 1138   GLUCOSE 117 (H) 01/15/2020 1138   BUN 8 01/15/2020 1138   BUN 11 08/09/2019 1554   CREATININE 0.77 01/15/2020 1138   CALCIUM 9.2 01/15/2020 1138    PROT 6.8 08/09/2019 1554   ALBUMIN 4.5 08/09/2019 1554   AST 28 08/09/2019 1554   ALT 18 08/09/2019 1554   ALKPHOS 108 08/09/2019 1554   BILITOT 0.2 08/09/2019 1554   GFRNONAA >60 01/15/2020 1138   GFRAA >60 01/15/2020 1138   Lab Results  Component Value Date   CHOL 208 (H) 12/15/2018   HDL 74 12/15/2018   LDLCALC 113 (H) 12/15/2018   TRIG 107 12/15/2018   CHOLHDL 2.8 12/15/2018   Lab Results  Component Value Date   HGBA1C 5.3 12/15/2018   Lab Results  Component Value Date   BLTJQZES92 330 08/09/2019   Lab Results  Component Value Date   TSH 6.500 (H) 02/03/2020    ASSESSMENT AND PLAN 74 y.o. year old female  has a past medical history of Memory loss. here with:  1.  Dementia with behavioral disturbance -Stopped her medications 1 month ago, resulted in behavioral change, irritability and agitation, appears this way today -MMSE 18/30 today -Restart Namenda 10 mg twice a day -Start Celexa 10 mg daily -Could not tolerate Aricept due to side effect of panic attack, went to the ER -Recommended she refrain from driving at this point -Discussed with her daughter, likely require increased supervision, her medication administration should be monitored -Seeing PCP tomorrow, for routine blood work, has been off Synthroid, needs recheck, denies signs of infection or UTI -Follow-up in 6 months or sooner if needed  I spent 30 minutes of face-to-face and non-face-to-face time with patient.  This included previsit chart review, lab review, study review, order entry, electronic health record documentation, patient education.  Butler Denmark, AGNP-C, DNP 03/21/2020, 3:18 PM [facility] [address] [place], [place] [address] [phone]

## 2020-03-21 NOTE — Patient Instructions (Signed)
Start Celexa 10 mg daily for the mood  Continue Namenda 10 mg twice daily  See you back in 6 months

## 2020-03-22 ENCOUNTER — Other Ambulatory Visit: Payer: Self-pay

## 2020-03-22 ENCOUNTER — Other Ambulatory Visit: Payer: Medicare HMO

## 2020-03-22 DIAGNOSIS — R7989 Other specified abnormal findings of blood chemistry: Secondary | ICD-10-CM | POA: Diagnosis not present

## 2020-03-23 LAB — TSH: TSH: 8.17 u[IU]/mL — ABNORMAL HIGH (ref 0.450–4.500)

## 2020-03-26 ENCOUNTER — Other Ambulatory Visit: Payer: Self-pay | Admitting: Neurology

## 2020-04-19 ENCOUNTER — Other Ambulatory Visit: Payer: Self-pay | Admitting: Neurology

## 2020-04-24 ENCOUNTER — Other Ambulatory Visit: Payer: Self-pay | Admitting: Physician Assistant

## 2020-04-24 ENCOUNTER — Telehealth: Payer: Self-pay | Admitting: Physician Assistant

## 2020-04-24 DIAGNOSIS — R7989 Other specified abnormal findings of blood chemistry: Secondary | ICD-10-CM

## 2020-04-24 NOTE — Telephone Encounter (Signed)
Daughter called in asking to see if appointment is follow up or bloodwork. Patient is refusing to take medicine and not talking to daughter.

## 2020-04-25 NOTE — Telephone Encounter (Signed)
LVM for pt to call to discuss.  T. Carmesha Morocco, CMA  

## 2020-04-26 NOTE — Telephone Encounter (Signed)
04/26/2020  Pt's daughter, [name], states that pt is not taking her medications and is not speaking to her.  [name] wishes to confirm reason for upcoming visit (ie lab vs. F/u).  Advised daughter that appt is for f/u and that she should keep this appt.  Daughter expressed understanding and is agreeable.  Charyl Bigger, CMA

## 2020-05-03 ENCOUNTER — Ambulatory Visit: Payer: Medicare HMO | Admitting: Physician Assistant

## 2020-07-05 ENCOUNTER — Ambulatory Visit: Payer: Medicare HMO | Admitting: Neurology

## 2020-08-13 ENCOUNTER — Telehealth: Payer: Self-pay | Admitting: Neurology

## 2020-08-13 NOTE — Telephone Encounter (Signed)
Pt's daughter, Marshell Garfinkel (on [place]) called, she believes the last appt at [facility] I had taken her to be admitted to assistant living facility . She has cut off her cellphone, took me off her bank account and refuse to take medication. Does not talk to me or her grandchildren. Would like a call from the nurse.
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
## 2020-08-15 NOTE — Telephone Encounter (Signed)
I called daughter, angela.  She gave me the # for her father Darlene Ballard.  [phone].  Pt has new flip phone and the # listed is not working home # for her.  I spoke to husband asking to speak to wife, about appt. Spoke to wife and stated we last saw her 03-22-20 and wanted to reschedule appt for her since we noted she had cancelled the appt.  She did not remember who we are or when we had last seen her.  She wanted to speak to husband about this and then call us back.  I left our #.

## 2020-08-16 ENCOUNTER — Telehealth: Payer: Self-pay | Admitting: Physician Assistant

## 2020-08-16 ENCOUNTER — Other Ambulatory Visit: Payer: Self-pay

## 2020-08-16 DIAGNOSIS — F0150 Vascular dementia without behavioral disturbance: Secondary | ICD-10-CM

## 2020-08-16 DIAGNOSIS — T7491XA Unspecified adult maltreatment, confirmed, initial encounter: Secondary | ICD-10-CM

## 2020-08-16 NOTE — Telephone Encounter (Signed)
[facility] per [place] and reported concern from daughter. They will notify us if they accept the case or not. AS, CMA

## 2020-08-16 NOTE — Telephone Encounter (Signed)
I have not heard back from pt or husband of pt.

## 2020-08-16 NOTE — Telephone Encounter (Signed)
Placed referral to social services for evaluation of home environment and patient safety.

## 2020-08-16 NOTE — Telephone Encounter (Signed)
Patient is Darlene Ballard. 12-23-2045. Liane Comber called from another [facility] office seeing if we have any information on patient. Patient has been cancelling appointments and recently changed phone number and patient's daughter is concerned she is not taking medications and cancelling appointments. Patient had dementia. Liane Comber can be reached at [phone]. Thank you.

## 2020-08-16 NOTE — Telephone Encounter (Signed)
Called patient spouse Jyrah Blye to speak with patient. No answer, left message for patient to call our office back. AS, CMA

## 2020-08-16 NOTE — Telephone Encounter (Signed)
I called [place].   LM with receptionist to have nurse call us back re: pt.
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
## 2020-08-16 NOTE — Telephone Encounter (Signed)
Spoke with [name] again. Advised her if we could not get patient into our office or neurology office that she could take patient to [facility] for [facility] evaluation. [name] verbalized understanding.    Please review below.   We are awaiting return call from [place] or [place]. AS, CMA

## 2020-08-16 NOTE — Telephone Encounter (Signed)
Athena with Primary Care called back.  I relayed that we have tried to connect with pt to have her come in for appt, worsening dementia per daughter, angela. Pt has cancelled appt with pcp and [place].  I did relay spoke to husband then pt to check on her to follow up with her and make appt.  They were to call back and have not.  Reached out to pcp per SS/NP to see if pt would come see them and for them to do referral for neuro-psych since we have not seen her.  They have not seen her since 01/2020.  Chesley Noon will try and let us know. I will contact [name], daughter to let her know.

## 2020-08-16 NOTE — Telephone Encounter (Signed)
Let's reach out to the PCP to see if they can reach her and she may agree to see them.

## 2020-08-20 NOTE — Telephone Encounter (Signed)
I called daughter, angela and relayed that we had spoken to pcp, trying to see if they were able to see pt. See note per pcp. Hopefully we will be able to see her for an appt soon.

## 2020-09-03 NOTE — Progress Notes (Signed)
I have reviewed and agreed above plan. 

## 2020-09-14 ENCOUNTER — Telehealth: Payer: Self-pay | Admitting: Physician Assistant

## 2020-09-14 DIAGNOSIS — F0150 Vascular dementia without behavioral disturbance: Secondary | ICD-10-CM

## 2020-09-14 DIAGNOSIS — T7491XA Unspecified adult maltreatment, confirmed, initial encounter: Secondary | ICD-10-CM
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
## 2020-09-17 ENCOUNTER — Telehealth: Payer: Self-pay | Admitting: Family Medicine

## 2020-09-17 ENCOUNTER — Other Ambulatory Visit: Payer: Self-pay

## 2020-09-17 ENCOUNTER — Encounter: Payer: Self-pay | Admitting: Family Medicine

## 2020-09-17 ENCOUNTER — Ambulatory Visit (INDEPENDENT_AMBULATORY_CARE_PROVIDER_SITE_OTHER): Payer: Medicare HMO | Admitting: Family Medicine

## 2020-09-17 VITALS — BP 120/70 | HR 65 | Ht 64.5 in | Wt 164.6 lb

## 2020-09-17 DIAGNOSIS — F0390 Unspecified dementia without behavioral disturbance: Secondary | ICD-10-CM | POA: Diagnosis not present

## 2020-09-17 DIAGNOSIS — R69 Illness, unspecified: Secondary | ICD-10-CM | POA: Diagnosis not present

## 2020-09-17 DIAGNOSIS — R413 Other amnesia: Secondary | ICD-10-CM

## 2020-09-17 DIAGNOSIS — R7989 Other specified abnormal findings of blood chemistry: Secondary | ICD-10-CM | POA: Diagnosis not present

## 2020-09-17 LAB — POCT URINALYSIS DIP (PROADVANTAGE DEVICE)
Bilirubin, UA: NEGATIVE
Blood, UA: NEGATIVE
Glucose, UA: NEGATIVE mg/dL
Ketones, POC UA: NEGATIVE mg/dL
Leukocytes, UA: NEGATIVE
Nitrite, UA: NEGATIVE
Specific Gravity, Urine: 1.025
Urobilinogen, Ur: NEGATIVE
pH, UA: 6 (ref 5.0–8.0)
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
## 2020-09-18 ENCOUNTER — Telehealth: Payer: Self-pay | Admitting: Family Medicine

## 2020-09-18 ENCOUNTER — Encounter: Payer: Self-pay | Admitting: Neurology

## 2020-09-18 LAB — CBC WITH DIFFERENTIAL/PLATELET
Basophils Absolute: 0.1 10*3/uL (ref 0.0–0.2)
Basos: 1 %
EOS (ABSOLUTE): 0.2 10*3/uL (ref 0.0–0.4)
Eos: 3 %
Hematocrit: 39.6 % (ref 34.0–46.6)
Hemoglobin: 13.6 g/dL (ref 11.1–15.9)
Immature Grans (Abs): 0 10*3/uL (ref 0.0–0.1)
Immature Granulocytes: 0 %
Lymphocytes Absolute: 1.4 10*3/uL (ref 0.7–3.1)
Lymphs: 21 %
MCH: 32.2 pg (ref 26.6–33.0)
MCHC: 34.3 g/dL (ref 31.5–35.7)
MCV: 94 fL (ref 79–97)
Monocytes Absolute: 0.4 10*3/uL (ref 0.1–0.9)
Monocytes: 7 %
Neutrophils Absolute: 4.6 10*3/uL (ref 1.4–7.0)
Neutrophils: 68 %
Platelets: 186 10*3/uL (ref 150–450)
RBC: 4.22 x10E6/uL (ref 3.77–5.28)
RDW: 12.6 % (ref 11.7–15.4)
WBC: 6.6 10*3/uL (ref 3.4–10.8)

## 2020-09-18 LAB — COMPREHENSIVE METABOLIC PANEL
ALT: 10 IU/L (ref 0–32)
AST: 22 IU/L (ref 0–40)
Albumin/Globulin Ratio: 1.9 (ref 1.2–2.2)
Albumin: 4.3 g/dL (ref 3.7–4.7)
Alkaline Phosphatase: 106 IU/L (ref 44–121)
BUN/Creatinine Ratio: 15 (ref 12–28)
BUN: 10 mg/dL (ref 8–27)
Bilirubin Total: 0.3 mg/dL (ref 0.0–1.2)
CO2: 25 mmol/L (ref 20–29)
Calcium: 9.3 mg/dL (ref 8.7–10.3)
Chloride: 101 mmol/L (ref 96–106)
Creatinine, Ser: 0.66 mg/dL (ref 0.57–1.00)
GFR calc Af Amer: 102 mL/min/{1.73_m2} (ref >59)
GFR calc non Af Amer: 89 mL/min/{1.73_m2} (ref >59)
Globulin, Total: 2.3 g/dL (ref 1.5–4.5)
Glucose: 96 mg/dL (ref 65–99)
Potassium: 4 mmol/L (ref 3.5–5.2)
Sodium: 139 mmol/L (ref 134–144)
Total Protein: 6.6 g/dL (ref 6.0–8.5)

## 2020-09-18 LAB — T4, FREE: Free T4: 0.93 ng/dL (ref 0.82–1.77)

## 2020-09-18 LAB — TSH: TSH: 5.91 u[IU]/mL — ABNORMAL HIGH (ref 0.450–4.500)

## 2020-09-18 LAB — VITAMIN B12: Vitamin B-12: 453 pg/mL (ref 232–1245)

## 2020-09-18 LAB — T3: T3, Total: 113 ng/dL (ref 71–180)

## 2020-09-18 NOTE — Telephone Encounter (Signed)
Error
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
## 2020-09-24 NOTE — Telephone Encounter (Signed)
Patient has transferred care from our office. AS, CMA

## 2020-09-26 ENCOUNTER — Ambulatory Visit: Payer: Medicare HMO | Admitting: Neurology

## 2020-09-27 ENCOUNTER — Ambulatory Visit: Payer: Medicare HMO | Admitting: Family Medicine

## 2020-10-17 ENCOUNTER — Institutional Professional Consult (permissible substitution): Payer: Medicare HMO | Admitting: Family Medicine

## 2020-12-11 ENCOUNTER — Encounter: Payer: Self-pay | Admitting: Neurology

## 2020-12-11 ENCOUNTER — Ambulatory Visit: Payer: PPO | Admitting: Neurology

## 2020-12-11 ENCOUNTER — Other Ambulatory Visit: Payer: Self-pay

## 2020-12-11 VITALS — BP 142/80 | HR 80 | Ht 64.5 in | Wt 156.2 lb

## 2020-12-11 DIAGNOSIS — F0280 Dementia in other diseases classified elsewhere without behavioral disturbance: Secondary | ICD-10-CM

## 2020-12-11 DIAGNOSIS — G301 Alzheimer's disease with late onset: Secondary | ICD-10-CM | POA: Diagnosis not present

## 2020-12-11 MED ORDER — MEMANTINE HCL 10 MG PO TABS: ORAL_TABLET | ORAL | 11 refills | Status: DC
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
## 2020-12-11 NOTE — Patient Instructions (Signed)
1. Start Memantine (Namenda) 10mg : take 1 tablet every night for 2 weeks, then increase to 1 tablet twice a day  2. As per DMV driving laws, no further driving  3. Continue close supervision  4. Follow-up in 6-8 months, call for any changes   FALL PRECAUTIONS: Be cautious when walking. Scan the area for obstacles that may increase the risk of trips and falls. When getting up in the mornings, sit up at the edge of the bed for a few minutes before getting out of bed. Consider elevating the bed at the head end to avoid drop of blood pressure when getting up. Walk always in a well-lit room (use night lights in the walls). Avoid area rugs or power cords from appliances in the middle of the walkways. Use a walker or a cane if necessary and consider physical therapy for balance exercise. Get your eyesight checked regularly.  FINANCIAL OVERSIGHT: Supervision, especially oversight when making financial decisions or transactions is also recommended.  HOME SAFETY: Consider the safety of the kitchen when operating appliances like stoves, microwave oven, and blender. Consider having supervision and share cooking responsibilities until no longer able to participate in those. Accidents with firearms and other hazards in the house should be identified and addressed as well.  ABILITY TO BE LEFT ALONE: If patient is unable to contact 911 operator, consider using LifeLine, or when the need is there, arrange for someone to stay with patients. Smoking is a fire hazard, consider supervision or cessation. Risk of wandering should be assessed by caregiver and if detected at any point, supervision and safe proof recommendations should be instituted.  MEDICATION SUPERVISION: Inability to self-administer medication needs to be constantly addressed. Implement a mechanism to ensure safe administration of the medications.  RECOMMENDATIONS FOR ALL PATIENTS WITH MEMORY PROBLEMS: 1. Continue to exercise (Recommend 30 minutes of  walking everyday, or 3 hours every week) 2. Increase social interactions - continue going to [employer] and enjoy social gatherings with friends and family 3. Eat healthy, avoid fried foods and eat more fruits and vegetables 4. Maintain adequate blood pressure, blood sugar, and blood cholesterol level. Reducing the risk of stroke and cardiovascular disease also helps promoting better memory. 5. Avoid stressful situations. Live a simple life and avoid aggravations. Organize your time and prepare for the next day in anticipation. 6. Sleep well, avoid any interruptions of sleep and avoid any distractions in the bedroom that may interfere with adequate sleep quality 7. Avoid sugar, avoid sweets as there is a strong link between excessive sugar intake, diabetes, and cognitive impairment We discussed the Mediterranean diet, which has been shown to help patients reduce the risk of progressive memory disorders and reduces cardiovascular risk. This includes eating fish, eat fruits and green leafy vegetables, nuts like almonds and hazelnuts, walnuts, and also use olive oil. Avoid fast foods and fried foods as much as possible. Avoid sweets and sugar as sugar use has been linked to worsening of memory function.  There is always a concern of gradual progression of memory problems. If this is the case, then we may need to adjust level of care according to patient needs. Support, both to the patient and caregiver, should then be put into place.

## 2021-02-13 ENCOUNTER — Other Ambulatory Visit: Payer: Self-pay

## 2021-02-13 ENCOUNTER — Ambulatory Visit (INDEPENDENT_AMBULATORY_CARE_PROVIDER_SITE_OTHER): Payer: PPO

## 2021-02-13 ENCOUNTER — Ambulatory Visit
Admission: RE | Admit: 2021-02-13 | Discharge: 2021-02-13 | Disposition: A | Discharge status: Home / Self Care | Payer: PPO | Source: Ambulatory Visit | Attending: Family Medicine | Admitting: Family Medicine

## 2021-02-13 VITALS — BP 141/93 | HR 68 | Temp 97.9°F | Resp 16

## 2021-02-13 DIAGNOSIS — M79631 Pain in right forearm: Secondary | ICD-10-CM | POA: Diagnosis not present

## 2021-02-13 DIAGNOSIS — S59911A Unspecified injury of right forearm, initial encounter: Secondary | ICD-10-CM

## 2021-02-13 DIAGNOSIS — M810 Age-related osteoporosis without current pathological fracture: Secondary | ICD-10-CM | POA: Diagnosis not present

## 2021-02-13 DIAGNOSIS — S59291A Other physeal fracture of lower end of radius, right arm, initial encounter for closed fracture: Secondary | ICD-10-CM | POA: Diagnosis not present

## 2021-02-13 DIAGNOSIS — S52501A Unspecified fracture of the lower end of right radius, initial encounter for closed fracture: Secondary | ICD-10-CM

## 2021-02-13 NOTE — ED Triage Notes (Signed)
Patient presents to Urgent Care with complaints of hitting her right arm against the couch. Pt states she has noted some swelling to arm and applying ice for swelling.   Denies any pain.

## 2021-02-13 NOTE — ED Provider Notes (Signed)
[facility]    CSN: [id number] Arrival date & time: 02/13/21  1021      History   Chief Complaint Chief Complaint  Patient presents with  . Wrist Injury  . Appointment    1100    HPI Darlene Ballard is a 75 y.o. female.   Patient with past medical history significant for dementia presents today with husband for evaluation of right forearm pain which she states has only started this morning but has been states its been going on for 3 to 4 days without obvious injury.  Patient states that her daughter and her husband have forced her to come today despite there being nothing wrong.  She does note that she hit her arm on the couch this morning but prior to that she does not recall any injuries or falls.  Denies weakness or decreased range of motion in the arm, bruising, swelling, numbness, tingling, past orthopedic injuries to the area.  Has not been trying anything over-the-counter for symptoms.    Past Medical History:  Diagnosis Date  . Memory loss     Patient Active Problem List   Diagnosis Date Noted  . Chronic pain of both knees 02/08/2020  . Dementia without behavioral disturbance ([place]) 08/09/2019  . Hyperlipidemia 02/01/2019  . Elevated TSH 02/01/2019  . Memory deficit 08/18/2018  . Healthcare maintenance 08/03/2018  . Anxiety 08/03/2018  . Hypertension 08/03/2018    Past Surgical History:  Procedure Laterality Date  . TONSILLECTOMY AND ADENOIDECTOMY      OB History   No obstetric history on file.      Home Medications    Prior to Admission medications   Medication Sig Start Date End Date Taking? Authorizing Provider  memantine (NAMENDA) 10 MG tablet Take 1 tablet every night for 2 weeks, then increase to 1 tablet twice a day 12/11/20   Cameron Sprang, MD    Family History Family History  Problem Relation Age of Onset  . Alzheimer's disease Mother   . Heart disease Mother   . Heart attack Mother   . Other Father        blood disorder -  unsure of condition    Social History Social History   Tobacco Use  . Smoking status: Never Smoker  . Smokeless tobacco: Never Used  Vaping Use  . Vaping Use: Never used  Substance Use Topics  . Alcohol use: Never  . Drug use: Never     Allergies   Patient has no known allergies.   Review of Systems Review of Systems Per HPI  Physical Exam Triage Vital Signs ED Triage Vitals  Enc Vitals Group     BP 02/13/21 1059 (S) (!) 141/93     Pulse Rate 02/13/21 1059 68     Resp 02/13/21 1059 16     Temp 02/13/21 1059 97.9 F (36.6 C)     Temp Source 02/13/21 1059 Oral     SpO2 02/13/21 1059 97 %     Weight --      Height --      Head Circumference --      Peak Flow --      Pain Score 02/13/21 1058 0     Pain Loc --      Pain Edu? --      Excl. in [place]? --    No data found.  Updated Vital Signs BP (S) (!) 141/93 (BP Location: Right Arm)   Pulse 68   Temp  97.9 F (36.6 C) (Oral)   Resp 16   SpO2 97%   Visual Acuity Right Eye Distance:   Left Eye Distance:   Bilateral Distance:    Right Eye Near:   Left Eye Near:    Bilateral Near:     Physical Exam Vitals and nursing note reviewed.  Constitutional:      Appearance: Normal appearance. She is not ill-appearing.  HENT:     Head: Atraumatic.  Eyes:     Extraocular Movements: Extraocular movements intact.     Conjunctiva/sclera: Conjunctivae normal.  Cardiovascular:     Rate and Rhythm: Normal rate and regular rhythm.     Heart sounds: Normal heart sounds.  Pulmonary:     Effort: Pulmonary effort is normal.     Breath sounds: Normal breath sounds.  Musculoskeletal:        General: Tenderness present. No swelling, deformity or signs of injury. Normal range of motion.     Cervical back: Normal range of motion and neck supple.     Comments: Minimal tenderness palpation distal right forearm Range of motion full and intact right upper extremity, grip strength very minimally decreased right hand versus left   Skin:    General: Skin is warm and dry.     Findings: No bruising or erythema.  Neurological:     Mental Status: She is alert.     Comments: Unclear baseline mental status given dementia Right upper extremity neurovascularly intact  Psychiatric:     Comments: Highly agitated throughout visit, very angry with husband anytime his information contradicts what she saying      UC Treatments / Results  Labs (all labs ordered are listed, but only abnormal results are displayed) Labs Reviewed - No data to display  EKG   Radiology DG Forearm Right  Result Date: 02/13/2021 CLINICAL DATA:  Pain after injury EXAM: RIGHT FOREARM - 2 VIEW COMPARISON:  None. FINDINGS: Frontal and lateral views obtained. There is sclerosis at the level of the distal radial metaphysis with suspected impaction injury in this area. No other evident fracture. No dislocation. Joint spaces appear normal. Bones osteoporotic. IMPRESSION: Apparent impaction type fracture at the distal radial metaphysis with alignment anatomic. No other evident fracture. No dislocation. No appreciable joint space narrowing. Underlying osteoporosis. These results will be called to the ordering clinician or representative by the Radiologist Assistant, and communication documented in the PACS or [employer]. Electronically Signed   By: Lowella Grip III M.D.   On: 02/13/2021 11:58    Procedures Procedures (including critical care time)  Medications Ordered in UC Medications - No data to display  Initial Impression / Assessment and Plan / UC Course  I have reviewed the triage vital signs and the nursing notes.  Pertinent labs & imaging results that were available during my care of the patient were reviewed by me and considered in my medical decision making (see chart for details).     X-ray right forearm today significant for distal radial metaphysis impaction fracture.  Wrist brace placed, discussed RICE protocol at home,  Tylenol as needed for pain relief, close follow-up with orthopedics in the next week for recheck.  Patient and husband agreeable to plan.  Final Clinical Impressions(s) / UC Diagnoses   Final diagnoses:  Closed fracture of distal end of right radius, unspecified fracture morphology, initial encounter   Discharge Instructions   None    ED Prescriptions    None     PDMP not reviewed this  encounter.   Volney American, [place] 02/13/21 1600

## 2021-06-09 ENCOUNTER — Ambulatory Visit (INDEPENDENT_AMBULATORY_CARE_PROVIDER_SITE_OTHER): Payer: PPO | Admitting: Family Medicine

## 2021-06-09 ENCOUNTER — Other Ambulatory Visit: Payer: Self-pay

## 2021-06-09 DIAGNOSIS — Z0000 Encounter for general adult medical examination without abnormal findings: Secondary | ICD-10-CM | POA: Diagnosis not present

## 2021-06-09 NOTE — Patient Instructions (Addendum)
Health Maintenance, Female Adopting a healthy lifestyle and getting preventive care are important in promoting health and wellness. Ask your health care provider about: The right schedule for you to have regular tests and exams. Things you can do on your own to prevent diseases and keep yourself healthy. What should I know about diet, weight, and exercise? Eat a healthy diet  Eat a diet that includes plenty of vegetables, fruits, low-fat dairy products, and lean protein. Do not eat a lot of foods that are high in solid fats, added sugars, or sodium.  Maintain a healthy weight Body mass index (BMI) is used to identify weight problems. It estimates body fat based on height and weight. Your health care provider can help determineyour BMI and help you achieve or maintain a healthy weight. Get regular exercise Get regular exercise. This is one of the most important things you can do for your health. Most adults should: Exercise for at least 150 minutes each week. The exercise should increase your heart rate and make you sweat (moderate-intensity exercise). Do strengthening exercises at least twice a week. This is in addition to the moderate-intensity exercise. Spend less time sitting. Even light physical activity can be beneficial. Watch cholesterol and blood lipids Have your blood tested for lipids and cholesterol at 73 years of age, then havethis test every 5 years. Have your cholesterol levels checked more often if: Your lipid or cholesterol levels are high. You are older than 73 years of age. You are at high risk for heart disease. What should I know about cancer screening? Depending on your health history and family history, you may need to have cancer screening at various ages. This may include screening for: Breast cancer. Cervical cancer. Colorectal cancer. Skin cancer. Lung cancer. What should I know about heart disease, diabetes, and high blood pressure? Blood pressure and heart  disease High blood pressure causes heart disease and increases the risk of stroke. This is more likely to develop in people who have high blood pressure readings, are of African descent, or are overweight. Have your blood pressure checked: Every 3-5 years if you are 18-39 years of age. Every year if you are 40 years old or older. Diabetes Have regular diabetes screenings. This checks your fasting blood sugar level. Have the screening done: Once every three years after age 40 if you are at a normal weight and have a low risk for diabetes. More often and at a younger age if you are overweight or have a high risk for diabetes. What should I know about preventing infection? Hepatitis B If you have a higher risk for hepatitis B, you should be screened for this virus. Talk with your health care provider to find out if you are at risk forhepatitis B infection. Hepatitis C Testing is recommended for: Everyone born from 1945 through 1965. Anyone with known risk factors for hepatitis C. Sexually transmitted infections (STIs) Get screened for STIs, including gonorrhea and chlamydia, if: You are sexually active and are younger than 73 years of age. You are older than 73 years of age and your health care provider tells you that you are at risk for this type of infection. Your sexual activity has changed since you were last screened, and you are at increased risk for chlamydia or gonorrhea. Ask your health care provider if you are at risk. Ask your health care provider about whether you are at high risk for HIV. Your health care provider may recommend a prescription medicine to help   prevent HIV infection. If you choose to take medicine to prevent HIV, you should first get tested for HIV. You should then be tested every 3 months for as long as you are taking the medicine. Pregnancy If you are about to stop having your period (premenopausal) and you may become pregnant, seek counseling before you get  pregnant. Take 400 to 800 micrograms (mcg) of folic acid every day if you become pregnant. Ask for birth control (contraception) if you want to prevent pregnancy. Osteoporosis and menopause Osteoporosis is a disease in which the bones lose minerals and strength with aging. This can result in bone fractures. If you are 7 years old or older, or if you are at risk for osteoporosis and fractures, ask your health care provider if you should: Be screened for bone loss. Take a calcium or vitamin D supplement to lower your risk of fractures. Be given hormone replacement therapy (HRT) to treat symptoms of menopause. Follow these instructions at home: Lifestyle Do not use any products that contain nicotine or tobacco, such as cigarettes, e-cigarettes, and chewing tobacco. If you need help quitting, ask your health care provider. Do not use street drugs. Do not share needles. Ask your health care provider for help if you need support or information about quitting drugs. Alcohol use Do not drink alcohol if: Your health care provider tells you not to drink. You are pregnant, may be pregnant, or are planning to become pregnant. If you drink alcohol: Limit how much you use to 0-1 drink a day. Limit intake if you are breastfeeding. Be aware of how much alcohol is in your drink. In the [place] one drink equals one 12 oz bottle of beer (355 mL), one 5 oz glass of wine (148 mL), or one 1 oz glass of hard liquor (44 mL). General instructions Schedule regular health, dental, and eye exams. Stay current with your vaccines. Tell your health care provider if: You often feel depressed. You have ever been abused or do not feel safe at home. Summary Adopting a healthy lifestyle and getting preventive care are important in promoting health and wellness. Follow your health care provider's instructions about healthy diet, exercising, and getting tested or screened for diseases. Follow your health care provider's  instructions on monitoring your cholesterol and blood pressure. This information is not intended to replace advice given to you by your health care provider. Make sure you discuss any questions you have with your healthcare provider. Document Revised: 09/22/2018 Document Reviewed: 09/22/2018 Elsevier Patient Education  2022 [place] Maintenance, Female Adopting a healthy lifestyle and getting preventive care are important in promoting health and wellness. Ask your health care provider about: The right schedule for you to have regular tests and exams. Things you can do on your own to prevent diseases and keep yourself healthy. What should I know about diet, weight, and exercise? Eat a healthy diet  Eat a diet that includes plenty of vegetables, fruits, low-fat dairy products, and lean protein. Do not eat a lot of foods that are high in solid fats, added sugars, or sodium.  Maintain a healthy weight Body mass index (BMI) is used to identify weight problems. It estimates body fat based on height and weight. Your health care provider can help determineyour BMI and help you achieve or maintain a healthy weight. Get regular exercise Get regular exercise. This is one of the most important things you can do for your health. Most adults should: Exercise for at least 150  minutes each week. The exercise should increase your heart rate and make you sweat (moderate-intensity exercise). Do strengthening exercises at least twice a week. This is in addition to the moderate-intensity exercise. Spend less time sitting. Even light physical activity can be beneficial. Watch cholesterol and blood lipids Have your blood tested for lipids and cholesterol at 73 years of age, then havethis test every 5 years. Have your cholesterol levels checked more often if: Your lipid or cholesterol levels are high. You are older than 73 years of age. You are at high risk for heart disease. What should I know  about cancer screening? Depending on your health history and family history, you may need to have cancer screening at various ages. This may include screening for: Breast cancer. Cervical cancer. Colorectal cancer. Skin cancer. Lung cancer. What should I know about heart disease, diabetes, and high blood pressure? Blood pressure and heart disease High blood pressure causes heart disease and increases the risk of stroke. This is more likely to develop in people who have high blood pressure readings, are of African descent, or are overweight. Have your blood pressure checked: Every 3-5 years if you are 72-81 years of age. Every year if you are 2 years old or older. Diabetes Have regular diabetes screenings. This checks your fasting blood sugar level. Have the screening done: Once every three years after age 23 if you are at a normal weight and have a low risk for diabetes. More often and at a younger age if you are overweight or have a high risk for diabetes. What should I know about preventing infection? Hepatitis B If you have a higher risk for hepatitis B, you should be screened for this virus. Talk with your health care provider to find out if you are at risk forhepatitis B infection. Hepatitis C Testing is recommended for: Everyone born from [birth year] through 1965. Anyone with known risk factors for hepatitis C. Sexually transmitted infections (STIs) Get screened for STIs, including gonorrhea and chlamydia, if: You are sexually active and are younger than 73 years of age. You are older than 73 years of age and your health care provider tells you that you are at risk for this type of infection. Your sexual activity has changed since you were last screened, and you are at increased risk for chlamydia or gonorrhea. Ask your health care provider if you are at risk. Ask your health care provider about whether you are at high risk for HIV. Your health care provider may recommend a prescription  medicine to help prevent HIV infection. If you choose to take medicine to prevent HIV, you should first get tested for HIV. You should then be tested every 3 months for as long as you are taking the medicine. Pregnancy If you are about to stop having your period (premenopausal) and you may become pregnant, seek counseling before you get pregnant. Take 400 to 800 micrograms (mcg) of folic acid every day if you become pregnant. Ask for birth control (contraception) if you want to prevent pregnancy. Osteoporosis and menopause Osteoporosis is a disease in which the bones lose minerals and strength with aging. This can result in bone fractures. If you are 73 years old or older, or if you are at risk for osteoporosis and fractures, ask your health care provider if you should: Be screened for bone loss. Take a calcium or vitamin D supplement to lower your risk of fractures. Be given hormone replacement therapy (HRT) to treat symptoms of menopause. Follow  these instructions at home: Lifestyle Do not use any products that contain nicotine or tobacco, such as cigarettes, e-cigarettes, and chewing tobacco. If you need help quitting, ask your health care provider. Do not use street drugs. Do not share needles. Ask your health care provider for help if you need support or information about quitting drugs. Alcohol use Do not drink alcohol if: Your health care provider tells you not to drink. You are pregnant, may be pregnant, or are planning to become pregnant. If you drink alcohol: Limit how much you use to 0-1 drink a day. Limit intake if you are breastfeeding. Be aware of how much alcohol is in your drink. In the [place] one drink equals one 12 oz bottle of beer (355 mL), one 5 oz glass of wine (148 mL), or one 1 oz glass of hard liquor (44 mL). General instructions Schedule regular health, dental, and eye exams. Stay current with your vaccines. Tell your health care provider if: You often feel  depressed. You have ever been abused or do not feel safe at home. Summary Adopting a healthy lifestyle and getting preventive care are important in promoting health and wellness. Follow your health care provider's instructions about healthy diet, exercising, and getting tested or screened for diseases. Follow your health care provider's instructions on monitoring your cholesterol and blood pressure. This information is not intended to replace advice given to you by your health care provider. Make sure you discuss any questions you have with your healthcare provider. Document Revised: 09/22/2018 Document Reviewed: 09/22/2018 Elsevier Patient Education  2022 [employer].

## 2021-06-09 NOTE — Progress Notes (Addendum)
Subjective:   Darlene Ballard is a 75 y.o. female who presents for an Initial Medicare Annual Wellness Visit. I connected with  Darlene Ballard on 06/10/21 by a audio enabled telemedicine application and verified that I am speaking with the correct person using two identifiers.  Location of patient: Home  Location of provider: unknown   Persons participating in the visit: Darlene Ballard( patient) Kaley Jutras(CMA)   I discussed the limitations of evaluation and management by telemedicine. The patient expressed understanding and agreed to proceed.   Review of Systems     Defer to PCP  Cardiac Risk Factors include: none     Objective:    There were no vitals filed for this visit. There is no height or weight on file to calculate BMI.  Advanced Directives 06/09/2021 12/11/2020 01/15/2020 08/03/2018  Does Patient Have a Medical Advance Directive? No No No No  Would patient like information on creating a medical advance directive? No - Patient declined - No - Patient declined No - Patient declined    Current Medications (verified) Outpatient Encounter Medications as of 06/09/2021  Medication Sig   memantine (NAMENDA) 10 MG tablet Take 1 tablet every night for 2 weeks, then increase to 1 tablet twice a day   No facility-administered encounter medications on file as of 06/09/2021.    Allergies (verified) Patient has no known allergies.   History: Past Medical History:  Diagnosis Date   Memory loss    Past Surgical History:  Procedure Laterality Date   TONSILLECTOMY AND ADENOIDECTOMY     Family History  Problem Relation Age of Onset   Alzheimer's disease Mother    Heart disease Mother    Heart attack Mother    Other Father        blood disorder - unsure of condition   Social History   Socioeconomic History   Marital status: Married    Spouse name: Not on file   Number of children: 1   Years of education: 2 years college   Highest education level: Not on file   Occupational History   Occupation: Retired  Tobacco Use   Smoking status: Never   Smokeless tobacco: Never  Vaping Use   Vaping Use: Never used  Substance and Sexual Activity   Alcohol use: Never   Drug use: Never   Sexual activity: Not Currently  Other Topics Concern   Not on file  Social History Narrative   Lives at home with husband.   Right-handed.   2 cups caffeine per day.   Social Determinants of Health   Financial Resource Strain: Not on file  Food Insecurity: Not on file  Transportation Needs: Not on file  Physical Activity: Not on file  Stress: Not on file  Social Connections: Not on file    Tobacco Counseling Counseling given: Not Answered   Clinical Intake:  Pre-visit preparation completed: Yes  Pain : No/denies pain     Diabetes: No  How often do you need to have someone help you when you read instructions, pamphlets, or other written materials from your doctor or pharmacy?: 1 - Never  Diabetic? NO  Interpreter Needed?: No      Activities of Daily Living In your present state of health, do you have any difficulty performing the following activities: 06/09/2021  Hearing? N  Vision? N  Comment does have reading glasses  Difficulty concentrating or making decisions? N  Walking or climbing stairs? N  Dressing or bathing? N  Doing errands, shopping? N  Preparing Food and eating ? N  Using the Toilet? N  In the past six months, have you accidently leaked urine? N  Do you have problems with loss of bowel control? N  Managing your Medications? N  Managing your Finances? N  Housekeeping or managing your Housekeeping? N  Some recent data might be hidden    Patient Care Team: Darlene Rm, NP-C as PCP - General (Family Medicine) Cameron Sprang, MD as Consulting Physician (Neurology)  Indicate any recent Medical Services you may have received from other than [facility] providers in the past year (date may be approximate).     Assessment:    This is a routine wellness examination for [name].  Hearing/Vision screen No results found.  Dietary issues and exercise activities discussed: Current Exercise Habits: Home exercise routine, Type of exercise: walking, Time (Minutes): 20, Frequency (Times/Week): 7, Weekly Exercise (Minutes/Week): 140, Intensity: Mild, Exercise limited by: None identified   Goals Addressed   None   Depression Screen PHQ 2/9 Scores 06/09/2021 09/17/2020 02/02/2020 08/16/2019 12/15/2018 09/01/2018 08/18/2018  PHQ - 2 Score 0 0 1 0 3 3 0  PHQ- 9 Score - - '2 1 10 6 '$ 0    Fall Risk Fall Risk  06/09/2021 12/11/2020 09/17/2020 01/03/2020 09/27/2019  Falls in the past year? 0 0 0 0 0  Number falls in past yr: - 0 0 - -  Injury with Fall? - 0 0 - -  Follow up - - - - Falls evaluation completed    [place]:  Any stairs in or around the home? No  If so, are there any without handrails? No Home free of loose throw rugs in walkways, pet beds, electrical cords, etc? No  Adequate lighting in your home to reduce risk of falls? No   ASSISTIVE DEVICES UTILIZED TO PREVENT FALLS:  Life alert? No  Use of a cane, walker or w/c? No  Grab bars in the bathroom? No  Shower chair or bench in shower? No  Elevated toilet seat or a handicapped toilet? No   TIMED UP AND GO:  Was the test performed?  N/A  sec.     Cognitive Function: MMSE - Mini Mental State Exam 03/21/2020 01/03/2020 08/09/2019 12/15/2018 09/01/2018  Orientation to time 2 0 '3 2 3  '$ Orientation to Place '3 3 4 4 5  '$ Registration '3 3 3 3 3  '$ Attention/ Calculation '1 1 1 1 2  '$ Recall 0 0 0 2 2  Language- name 2 objects '2 2 2 2 2  '$ Language- repeat '1 1 1 1 1  '$ Language- follow 3 step command '3 3 3 2 1  '$ Language- read & follow direction '1 1 1 1 1  '$ Write a sentence '1 1 1 '$ 0 1  Copy design '1 1 1 1 1  '$ Total score '18 16 20 19 22   '$ Montreal Cognitive Assessment  12/11/2020  Visuospatial/ Executive (0/5) 0  Naming (0/3) 1  Attention:  Read list of digits (0/2) 1  Attention: Read list of letters (0/1) 0  Attention: Serial 7 subtraction starting at 100 (0/3) 1  Language: Repeat phrase (0/2) 0  Language : Fluency (0/1) 0  Abstraction (0/2) 1  Delayed Recall (0/5) 0  Orientation (0/6) 0  Total 4  Adjusted Score (based on education) 4   6CIT Screen 06/09/2021  What Year? 0 points  What month? 0 points  What time? 0 points  Count  back from 20 0 points  Months in reverse 4 points  Repeat phrase 10 points  Total Score 14    Immunizations Immunization History  Administered Date(s) Administered   Influenza-Unspecified 10/21/2018   Pneumococcal Conjugate-13 10/21/2018   Tdap 07/25/2015    TDAP status: Up to date  Flu Vaccine status: Due, Education has been provided regarding the importance of this vaccine. Advised may receive this vaccine at local pharmacy or Health Dept. Aware to provide a copy of the vaccination record if obtained from local pharmacy or Health Dept. Verbalized acceptance and understanding.  Pneumococcal vaccine status: Due, Education has been provided regarding the importance of this vaccine. Advised may receive this vaccine at local pharmacy or Health Dept. Aware to provide a copy of the vaccination record if obtained from local pharmacy or Health Dept. Verbalized acceptance and understanding.  Covid-19 vaccine status: Information provided on how to obtain vaccines.   Qualifies for Shingles Vaccine? Yes   Zostavax completed No   Shingrix Completed?: No.    Education has been provided regarding the importance of this vaccine. Patient has been advised to call insurance company to determine out of pocket expense if they have not yet received this vaccine. Advised may also receive vaccine at local pharmacy or Health Dept. Verbalized acceptance and understanding.  Screening Tests Health Maintenance  Topic Date Due   COVID-19 Vaccine (1) Never done   Hepatitis C Screening  Never done   COLONOSCOPY  (Pts 45-56yr Insurance coverage will need to be confirmed)  Never done   Zoster Vaccines- Shingrix (1 of 2) Never done   MAMMOGRAM  09/08/2008   DEXA SCAN  Never done   PNA vac Low Risk Adult (2 of 2 - PPSV23) 10/22/2019   INFLUENZA VACCINE  05/13/2021   TETANUS/TDAP  07/24/2025   HPV VACCINES  Aged Out    Health Maintenance  Health Maintenance Due  Topic Date Due   COVID-19 Vaccine (1) Never done   Hepatitis C Screening  Never done   COLONOSCOPY (Pts 45-438yrInsurance coverage will need to be confirmed)  Never done   Zoster Vaccines- Shingrix (1 of 2) Never done   MAMMOGRAM  09/08/2008   DEXA SCAN  Never done   PNA vac Low Risk Adult (2 of 2 - PPSV23) 10/22/2019   INFLUENZA VACCINE  05/13/2021    Colorectal cancer screening: No longer required.   Mammogram status: No longer required due to age.  Bone Density Status: will discuss need with PCP.   Lung Cancer Screening: (Low Dose CT Chest recommended if Age 10328-80ears, 30 pack-year currently smoking OR have quit w/in 15years.) does not qualify.   Lung Cancer Screening Referral:   Additional Screening:  Hepatitis C Screening: does qualify;patient will discuss with PCP  Vision Screening: Recommended annual ophthalmology exams for early detection of glaucoma and other disorders of the eye. Is the patient up to date with their annual eye exam?  Yes  Who is the provider or what is the name of the office in which the patient attends annual eye exams?  If pt is not established with a provider, would they like to be referred to a provider to establish care? No .   Dental Screening: Recommended annual dental exams for proper oral hygiene  Community Resource Referral / Chronic Care Management: CRR required this visit?  No   CCM required this visit?  No      Plan:     I have personally reviewed and noted the  following in the patient's chart:   Medical and social history Use of alcohol, tobacco or illicit drugs   Current medications and supplements including opioid prescriptions. Patient is not currently taking opioid prescriptions. Functional ability and status Nutritional status Physical activity Advanced directives List of other physicians Hospitalizations, surgeries, and ER visits in previous 12 months Vitals Screenings to include cognitive, depression, and falls Referrals and appointments  In addition, I have reviewed and discussed with patient certain preventive protocols, quality metrics, and best practice recommendations. A written personalized care plan for preventive services as well as general preventive health recommendations were provided to patient.     Nat Christen, CMA   06/10/2021   Nurse Notes: Non Face to Face 30 minute visit.   Ms. Kershaw , Thank you for taking time to come for your Medicare Wellness Visit. I appreciate your ongoing commitment to your health goals. Please review the following plan we discussed and let me know if I can assist you in the future.   These are the goals we discussed:  Goals   None     This is a list of the screening recommended for you and due dates:  Health Maintenance  Topic Date Due   COVID-19 Vaccine (1) Never done   Hepatitis C Screening: USPSTF Recommendation to screen - Ages 50-79 yo.  Never done   Colon Cancer Screening  Never done   Zoster (Shingles) Vaccine (1 of 2) Never done   Mammogram  09/08/2008   DEXA scan (bone density measurement)  Never done   Pneumonia vaccines (2 of 2 - PPSV23) 10/22/2019   Flu Shot  05/13/2021   Tetanus Vaccine  07/24/2025   HPV Vaccine  Aged Out

## 2021-06-26 ENCOUNTER — Other Ambulatory Visit: Payer: Self-pay

## 2021-06-26 ENCOUNTER — Encounter: Payer: Self-pay | Admitting: Family Medicine

## 2021-06-26 ENCOUNTER — Ambulatory Visit (INDEPENDENT_AMBULATORY_CARE_PROVIDER_SITE_OTHER): Payer: PPO | Admitting: Family Medicine

## 2021-06-26 VITALS — BP 128/82 | HR 77 | Temp 98.0°F | Wt 145.8 lb

## 2021-06-26 DIAGNOSIS — Z23 Encounter for immunization: Secondary | ICD-10-CM

## 2021-06-26 DIAGNOSIS — F0390 Unspecified dementia without behavioral disturbance: Secondary | ICD-10-CM | POA: Diagnosis not present

## 2021-06-26 DIAGNOSIS — L608 Other nail disorders: Secondary | ICD-10-CM

## 2021-06-26 DIAGNOSIS — D229 Melanocytic nevi, unspecified: Secondary | ICD-10-CM | POA: Diagnosis not present

## 2021-06-26 DIAGNOSIS — E039 Hypothyroidism, unspecified: Secondary | ICD-10-CM

## 2021-06-26 DIAGNOSIS — K08139 Complete loss of teeth due to caries, unspecified class: Secondary | ICD-10-CM

## 2021-06-26 LAB — POCT URINALYSIS DIP (PROADVANTAGE DEVICE)
Bilirubin, UA: NEGATIVE
Blood, UA: NEGATIVE
Glucose, UA: NEGATIVE mg/dL
Ketones, POC UA: NEGATIVE mg/dL
Leukocytes, UA: NEGATIVE
Nitrite, UA: NEGATIVE
Protein Ur, POC: NEGATIVE mg/dL
Specific Gravity, Urine: 1.03
Urobilinogen, Ur: 0.2
pH, UA: 5.5 (ref 5.0–8.0)

## 2021-06-26 NOTE — Patient Instructions (Signed)
You will receive a call from [facility]. Their phone number is [phone]  I recommend you call Dr. Ledell Peoples office and schedule an appointment. [phone]  I also recommend that you call Dr. Ellouise Newer, your neurologist, and follow-up as recommended regarding memory concerns. Her phone number is [phone]  Check with your insurance carrier in regards to a dentist.

## 2021-06-26 NOTE — Progress Notes (Signed)
Subjective:    Patient ID: Darlene Ballard, female    DOB: 08/29/1946, 75 y.o.   MRN: [id number]  HPI Chief Complaint  Patient presents with   other    Mole on her chest, look at her feet, and teeth are in bad shape need to get to the dentist. Forgetting things like pocket books, pt. Wants flu shot also.    She is here today with her husband with multiple concerns.  States she has a large mole on her chest that was removed at 1 point years ago and is now back.  States the mole is large and she thinks she might want it removed again.  Also complains of toenail issues including thickening and discoloration.  Great toenails are worse.  She notes pain at times.  No numbness, tingling or weakness.  Her husband is concerned about her teeth and they would like a recommendation as to where to go for dentures. Patient denies pain or sensitivity.  They are also concerned about worsening memory issues.  Her husband reports giving her Namenda twice daily but occasionally he forgets the morning dose.  He would like to know if there is anything else that can be done for her memory.  They did see neurology, Dr. Delice Lesch, in March and have not followed up.  She denies any concerns today.   Review of Systems Pertinent positives and negatives in the history of present illness.     Objective:   Physical Exam Constitutional:      General: She is not in acute distress.    Appearance: Normal appearance. She is not ill-appearing.  HENT:     Mouth/Throat:     Dentition: Abnormal dentition. Dental caries present.  Eyes:     Conjunctiva/sclera: Conjunctivae normal.  Cardiovascular:     Rate and Rhythm: Normal rate and regular rhythm.     Pulses: Normal pulses.     Heart sounds: Normal heart sounds.  Pulmonary:     Effort: Pulmonary effort is normal.     Breath sounds: Normal breath sounds.  Musculoskeletal:     Cervical back: Normal range of motion and neck supple.  Feet:     Right foot:      Toenail Condition: Right toenails are abnormally thick and long. Fungal disease present. Skin:    General: Skin is warm and dry.     Comments: Large nevus on anterior chest, dark and raised  Neurological:     General: No focal deficit present.     Mental Status: She is alert. Mental status is at baseline.  Psychiatric:        Mood and Affect: Mood normal.   BP 128/82   Pulse 77   Temp 98 F (36.7 C)   Wt 145 lb 12.8 oz (66.1 kg)   BMI 24.64 kg/m       Assessment & Plan:  Deformity of toenail - Plan: Ambulatory referral to Podiatry -She may soak her feet but I recommend getting into see the podiatrist soon as possible.  Need for influenza vaccination - Plan: Flu Vaccine QUAD High Dose(Fluad)  Dementia without behavioral disturbance, unspecified dementia type ([place]) - Plan: CBC with Differential/Platelet, Comprehensive metabolic panel, POCT Urinalysis DIP (Proadvantage Device), TSH, T4, free, T3, Vitamin B12 -Chronic and ongoing memory loss.  She has seen neurology and recommend that she follow-up.  I will check labs and urine today to look for any underlying issues.  Hypothyroidism, unspecified type - Plan: CBC with Differential/Platelet, Comprehensive  metabolic panel, POCT Urinalysis DIP (Proadvantage Device), TSH, T4, free, T3, Vitamin B12 -She is not on medication.  Check labs and follow-up.  Loss of teeth due to cavities, unspecified edentulism class -I recommend they check with her insurance carrier and call and schedule a dental exam.  She denies pain or sensitivity or difficulties eating.  [place]-  -advised to follow up with Dr. Allyson Sabal, he is her dermatologist.

## 2021-06-27 LAB — CBC WITH DIFFERENTIAL/PLATELET
Basophils Absolute: 0.1 10*3/uL (ref 0.0–0.2)
Basos: 1 %
EOS (ABSOLUTE): 0.2 10*3/uL (ref 0.0–0.4)
Eos: 3 %
Hematocrit: 38.8 % (ref 34.0–46.6)
Hemoglobin: 13.1 g/dL (ref 11.1–15.9)
Immature Grans (Abs): 0 10*3/uL (ref 0.0–0.1)
Immature Granulocytes: 0 %
Lymphocytes Absolute: 1.3 10*3/uL (ref 0.7–3.1)
Lymphs: 20 %
MCH: 30.7 pg (ref 26.6–33.0)
MCHC: 33.8 g/dL (ref 31.5–35.7)
MCV: 91 fL (ref 79–97)
Monocytes Absolute: 0.4 10*3/uL (ref 0.1–0.9)
Monocytes: 6 %
Neutrophils Absolute: 4.4 10*3/uL (ref 1.4–7.0)
Neutrophils: 70 %
Platelets: 179 10*3/uL (ref 150–450)
RBC: 4.27 x10E6/uL (ref 3.77–5.28)
RDW: 12.2 % (ref 11.7–15.4)
WBC: 6.3 10*3/uL (ref 3.4–10.8)

## 2021-06-27 LAB — COMPREHENSIVE METABOLIC PANEL
ALT: 12 IU/L (ref 0–32)
AST: 21 IU/L (ref 0–40)
Albumin/Globulin Ratio: 1.9 (ref 1.2–2.2)
Albumin: 4.2 g/dL (ref 3.7–4.7)
Alkaline Phosphatase: 102 IU/L (ref 44–121)
BUN/Creatinine Ratio: 15 (ref 12–28)
BUN: 11 mg/dL (ref 8–27)
Bilirubin Total: 0.4 mg/dL (ref 0.0–1.2)
CO2: 24 mmol/L (ref 20–29)
Calcium: 9 mg/dL (ref 8.7–10.3)
Chloride: 104 mmol/L (ref 96–106)
Creatinine, Ser: 0.75 mg/dL (ref 0.57–1.00)
Globulin, Total: 2.2 g/dL (ref 1.5–4.5)
Glucose: 90 mg/dL (ref 65–99)
Potassium: 4 mmol/L (ref 3.5–5.2)
Sodium: 143 mmol/L (ref 134–144)
Total Protein: 6.4 g/dL (ref 6.0–8.5)
eGFR: 84 mL/min/{1.73_m2} (ref >59)

## 2021-06-27 LAB — VITAMIN B12: Vitamin B-12: 415 pg/mL (ref 232–1245)

## 2021-06-27 LAB — T3: T3, Total: 100 ng/dL (ref 71–180)

## 2021-06-27 LAB — TSH: TSH: 7.18 u[IU]/mL — ABNORMAL HIGH (ref 0.450–4.500)

## 2021-06-27 LAB — T4, FREE: Free T4: 0.84 ng/dL (ref 0.82–1.77)

## 2021-06-27 NOTE — Progress Notes (Signed)
Her thyroid level is abnormal. Is she willing to start on a low dose thyroid medication? If so, she will need to take this every morning on an empty stomach at least 30 minutes before eating or drinking anything other than water. I also recommend taking a One A Day multivitamin everyday but not with the thyroid medication. Let me know please. She should have her thyroid rechecked 4 weeks after starting the medication.

## 2021-07-16 ENCOUNTER — Encounter: Payer: Self-pay | Admitting: Podiatry

## 2021-07-16 ENCOUNTER — Other Ambulatory Visit: Payer: Self-pay

## 2021-07-16 ENCOUNTER — Ambulatory Visit: Payer: PPO | Admitting: Podiatry

## 2021-07-16 DIAGNOSIS — M79674 Pain in right toe(s): Secondary | ICD-10-CM

## 2021-07-16 DIAGNOSIS — M79675 Pain in left toe(s): Secondary | ICD-10-CM | POA: Diagnosis not present

## 2021-07-16 DIAGNOSIS — B351 Tinea unguium: Secondary | ICD-10-CM

## 2021-07-16 NOTE — Progress Notes (Signed)
  Subjective:  Patient ID: Darlene Ballard, female    DOB: 10-16-1945,   MRN: [id number]  Chief Complaint  Patient presents with   Nail Problem    Long thick toenail difficult to cut B/l nails 1-5.     75 y.o. female presents for painful and tick toenails that have been present for several years. Patient requesting to have them trimmed today . Denies any other pedal complaints. Denies n/v/f/c.   Past Medical History:  Diagnosis Date   Memory loss     Objective:  Physical Exam: Vascular: DP/PT pulses 2/4 bilateral. CFT <3 seconds. Normal hair growth on digits. No edema.  Skin. No lacerations or abrasions bilateral feet. Thickened discolored and elongated toenails with subungual debris 1-5 b/l.  Musculoskeletal: MMT 5/5 bilateral lower extremities in DF, PF, Inversion and Eversion. Deceased ROM in DF of ankle joint.  Neurological: Sensation intact to light touch.   Assessment:   1. Pain due to onychomycosis of toenails of both feet      Plan:  Patient was evaluated and treated and all questions answered. -Discussed onychomycosis and treatment options. Patient would like to have nails trimmed.  -Mechanically debrided all nails 1-5 bilateral using sterile nail nipper and filed with dremel without incident  -Answered all patient questions -Patient to return as needed for nail trim.  -Patient advised to call the office if any problems or questions arise in the meantime.   Lorenda Peck, DPM

## 2021-11-18 IMAGING — DX DG FOREARM 2V*R*
2 series · 2 of 2 positions shown · non-contrast
Comparison: None.

CLINICAL DATA: Pain after injury

EXAM:
RIGHT FOREARM - 2 VIEW

[forearm ap]
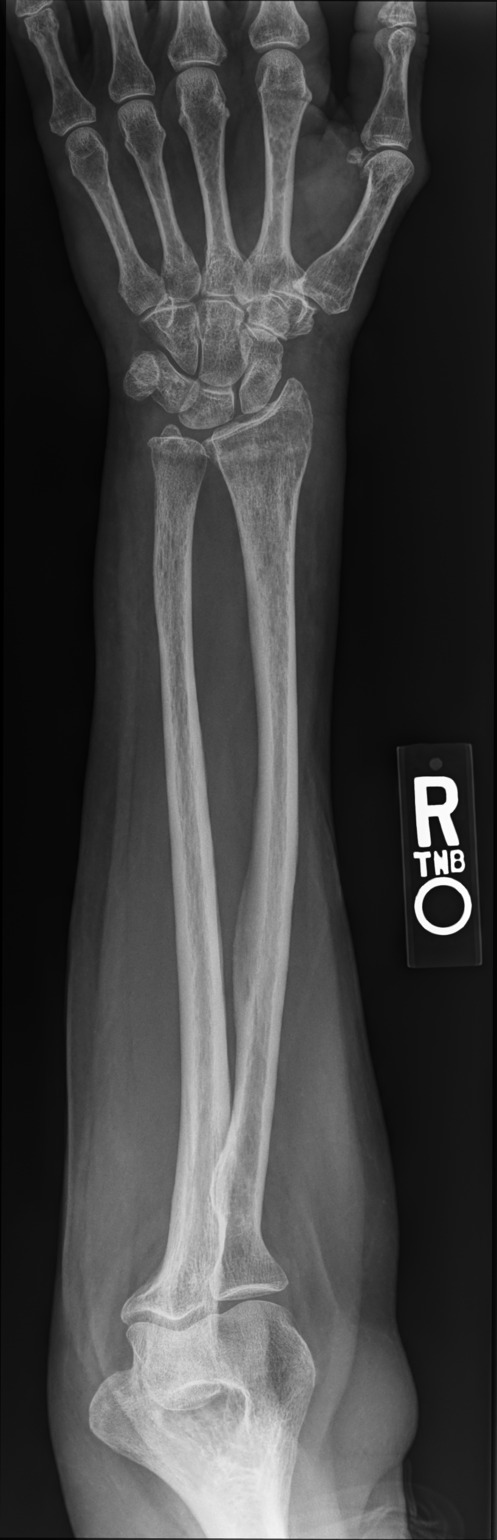

[forearm lat]
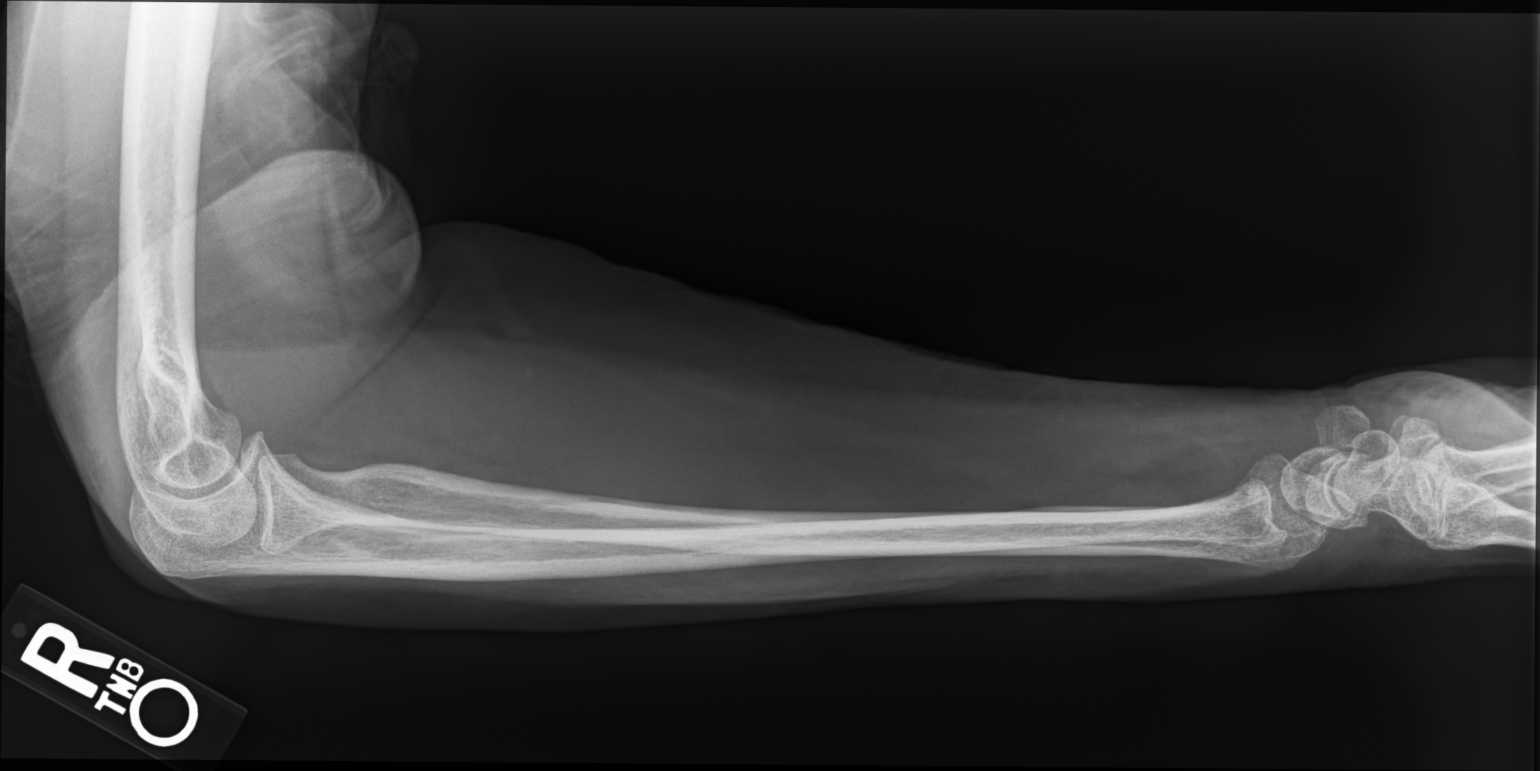

[2 of 2 positions shown; findings below may reference images not displayed]

FINDINGS: Frontal and lateral views obtained. There is sclerosis at the level
of the distal radial metaphysis with suspected impaction injury in
this area. No other evident fracture. No dislocation. Joint spaces
appear normal. Bones osteoporotic.
IMPRESSION: Apparent impaction type fracture at the distal radial metaphysis
with alignment anatomic. No other evident fracture. No dislocation.
No appreciable joint space narrowing. Underlying osteoporosis.

These results will be called to the ordering clinician or
representative by the Radiologist Assistant, and communication
documented in the PACS or [REDACTED].

## 2021-12-02 ENCOUNTER — Ambulatory Visit (INDEPENDENT_AMBULATORY_CARE_PROVIDER_SITE_OTHER): Payer: PPO | Admitting: Physician Assistant

## 2021-12-02 ENCOUNTER — Other Ambulatory Visit: Payer: Self-pay

## 2021-12-02 ENCOUNTER — Encounter: Payer: Self-pay | Admitting: Physician Assistant

## 2021-12-02 VITALS — BP 124/80 | HR 76 | Resp 16 | Ht 63.0 in | Wt 149.0 lb

## 2021-12-02 DIAGNOSIS — E039 Hypothyroidism, unspecified: Secondary | ICD-10-CM

## 2021-12-02 DIAGNOSIS — F0390 Unspecified dementia without behavioral disturbance: Secondary | ICD-10-CM | POA: Diagnosis not present

## 2021-12-02 DIAGNOSIS — E782 Mixed hyperlipidemia: Secondary | ICD-10-CM

## 2021-12-02 DIAGNOSIS — I10 Essential (primary) hypertension: Secondary | ICD-10-CM | POA: Diagnosis not present

## 2021-12-02 NOTE — Progress Notes (Signed)
Acute Office Visit  Subjective:    Patient ID: Darlene Ballard, female    DOB: 09/10/1946, 76 y.o.   MRN: [id number]  Chief Complaint  Patient presents with   Consult    They have a letter from insurance company re:POA. He did forget it today.     HPI Patient is in today for a follow up appointment. Presents with her Husband. He reports that he made the appointment to have her PCP review a specific section on her POA form that required a PCP; Husband states he forgot the form this morning, but will bring it back.  According to her Husband, she doesn't take medicine for her HTN, HLD, or hypothyroidism.   Neurology - Dr. Ellouise Newer  Past Medical History:  Diagnosis Date   Memory loss     Past Surgical History:  Procedure Laterality Date   TONSILLECTOMY AND ADENOIDECTOMY      Family History  Problem Relation Age of Onset   Alzheimer's disease Mother    Heart disease Mother    Heart attack Mother    Other Father        blood disorder - unsure of condition    Social History   Socioeconomic History   Marital status: Married    Spouse name: Not on file   Number of children: 1   Years of education: 2 years college   Highest education level: Not on file  Occupational History   Occupation: Retired  Tobacco Use   Smoking status: Never   Smokeless tobacco: Never  Vaping Use   Vaping Use: Never used  Substance and Sexual Activity   Alcohol use: Never   Drug use: Never   Sexual activity: Not Currently  Other Topics Concern   Not on file  Social History Narrative   Lives at home with husband.   Right-handed.   2 cups caffeine per day.   Social Determinants of Health   Financial Resource Strain: Not on file  Food Insecurity: Not on file  Transportation Needs: Not on file  Physical Activity: Not on file  Stress: Not on file  Social Connections: Not on file  Intimate Partner Violence: Not on file    Outpatient Medications Prior to Visit  Medication Sig  Dispense Refill   memantine (NAMENDA) 10 MG tablet Take 1 tablet every night for 2 weeks, then increase to 1 tablet twice a day (Patient taking differently: Take 10 mg by mouth 2 (two) times daily. Take 1 tablet every night for 2 weeks, then increase to 1 tablet twice a day) 60 tablet 11   No facility-administered medications prior to visit.    No Known Allergies  Review of Systems  Constitutional:  Negative for activity change and chills.  HENT:  Negative for congestion and voice change.   Eyes:  Negative for pain and redness.  Respiratory:  Negative for cough and wheezing.   Cardiovascular:  Negative for chest pain.  Gastrointestinal:  Negative for constipation, diarrhea, nausea and vomiting.  Endocrine: Negative for polyuria.  Genitourinary:  Negative for frequency.  Skin:  Negative for color change and rash.  Allergic/Immunologic: Negative for immunocompromised state.  Neurological:  Negative for dizziness.  Psychiatric/Behavioral:  Negative for agitation.       Objective:    Physical Exam Vitals and nursing note reviewed.  Constitutional:      General: She is not in acute distress.    Appearance: Normal appearance. She is not ill-appearing.  HENT:  Head: Normocephalic and atraumatic.     Right Ear: External ear normal.     Left Ear: External ear normal.     Nose: No congestion.  Eyes:     Extraocular Movements: Extraocular movements intact.     Conjunctiva/sclera: Conjunctivae normal.     Pupils: Pupils are equal, round, and reactive to light.  Cardiovascular:     Rate and Rhythm: Normal rate and regular rhythm.     Pulses: Normal pulses.     Heart sounds: Normal heart sounds.  Pulmonary:     Effort: Pulmonary effort is normal.     Breath sounds: Normal breath sounds. No wheezing.  Abdominal:     General: Bowel sounds are normal.     Palpations: Abdomen is soft.  Musculoskeletal:     Cervical back: Normal range of motion and neck supple.     Right lower leg:  No edema.     Left lower leg: No edema.  Skin:    General: Skin is warm and dry.     Findings: No bruising.  Neurological:     General: No focal deficit present.     Mental Status: She is alert and oriented to person, place, and time.  Psychiatric:        Mood and Affect: Mood normal.        Behavior: Behavior normal.    BP 124/80    Pulse 76    Resp 16    Ht _0  (1.6 m)    Wt 149 lb (67.6 kg)    BMI 26.39 kg/m   Wt Readings from Last 3 Encounters:  12/02/21 149 lb (67.6 kg)  06/26/21 145 lb 12.8 oz (66.1 kg)  12/11/20 156 lb 3.2 oz (70.9 kg)    There are no preventive care reminders to display for this patient.   There are no preventive care reminders to display for this patient.   Lab Results  Component Value Date   TSH 7.180 (H) 06/26/2021   Lab Results  Component Value Date   WBC 6.3 06/26/2021   HGB 13.1 06/26/2021   HCT 38.8 06/26/2021   MCV 91 06/26/2021   PLT 179 06/26/2021   Lab Results  Component Value Date   NA 143 06/26/2021   K 4.0 06/26/2021   CO2 24 06/26/2021   GLUCOSE 90 06/26/2021   BUN 11 06/26/2021   CREATININE 0.75 06/26/2021   BILITOT 0.4 06/26/2021   ALKPHOS 102 06/26/2021   AST 21 06/26/2021   ALT 12 06/26/2021   PROT 6.4 06/26/2021   ALBUMIN 4.2 06/26/2021   CALCIUM 9.0 06/26/2021   ANIONGAP 14 01/15/2020   EGFR 84 06/26/2021   Lab Results  Component Value Date   CHOL 208 (H) 12/15/2018   Lab Results  Component Value Date   HDL 74 12/15/2018   Lab Results  Component Value Date   LDLCALC 113 (H) 12/15/2018   Lab Results  Component Value Date   TRIG 107 12/15/2018   Lab Results  Component Value Date   CHOLHDL 2.8 12/15/2018   Lab Results  Component Value Date   HGBA1C 5.3 12/15/2018       Assessment & Plan:   Problem List Items Addressed This Visit       Cardiovascular and Mediastinum   Hypertension     Endocrine   Hypothyroidism     Nervous and Auditory   Dementia without behavioral disturbance  ([place]) - Primary   Relevant Orders   Ambulatory referral  to Neurology     Other   Hyperlipidemia   Dementia - stable, will review paperwork when Husband returns with it, continue follow up with Neurology  Hypertension - stable, diet controlled, eat a low salt diet  Hypothyroidism - stable, not currently taking medication for it  Hyperlipidemia - stable, diet controlled, eat a low fat diet  No orders of the defined types were placed in this encounter.    Irene Pap, PA-C

## 2021-12-13 ENCOUNTER — Ambulatory Visit: Payer: PPO | Admitting: Physician Assistant

## 2022-01-23 ENCOUNTER — Other Ambulatory Visit: Payer: Self-pay | Admitting: Neurology

## 2022-01-23 NOTE — Telephone Encounter (Signed)
Patient is out of memantine 10 MG per patient's husband. ? ?She has an appointment with Sharene Butters, PA tomorrow but doesn't have enough medication for tonight. ? ?He'd like a refill sent in as soon as possible as he is concerned about her missing any doses. ?

## 2022-01-24 ENCOUNTER — Encounter: Payer: Self-pay | Admitting: Physician Assistant

## 2022-01-24 ENCOUNTER — Ambulatory Visit: Payer: PPO | Admitting: Physician Assistant

## 2022-01-24 VITALS — BP 146/83 | HR 73 | Resp 18 | Ht 65.0 in | Wt 151.0 lb

## 2022-01-24 DIAGNOSIS — F0390 Unspecified dementia without behavioral disturbance: Secondary | ICD-10-CM

## 2022-01-24 MED ORDER — DONEPEZIL HCL 10 MG PO TABS: ORAL_TABLET | ORAL | 11 refills | Status: DC

## 2022-01-24 NOTE — Patient Instructions (Addendum)
It was a pleasure to see you today at our office.  ? ?Recommendations: ? ?Meds: ?Follow up in 6  months ?We will start donepezil half tablet ('5mg'$ ) daily for 2  weeks.  If you are tolerating the medication, then after 2 weeks, we will increase the dose to a full tablet of 10 mg daily.  ?Continue Memantine 10 mg twice daily.Side effects were discussed  ?Follow up with your primary doctor about the sugars  ? ?RECOMMENDATIONS FOR ALL PATIENTS WITH MEMORY PROBLEMS: ?1. Continue to exercise (Recommend 30 minutes of walking everyday, or 3 hours every week) ?2. Increase social interactions - continue going to [place] and enjoy social gatherings with friends and family ?3. Eat healthy, avoid fried foods and eat more fruits and vegetables ?4. Maintain adequate blood pressure, blood sugar, and blood cholesterol level. Reducing the risk of stroke and cardiovascular disease also helps promoting better memory. ?5. Avoid stressful situations. Live a simple life and avoid aggravations. Organize your time and prepare for the next day in anticipation. ?6. Sleep well, avoid any interruptions of sleep and avoid any distractions in the bedroom that may interfere with adequate sleep quality ?7. Avoid sugar, avoid sweets as there is a strong link between excessive sugar intake, diabetes, and cognitive impairment ?We discussed the Mediterranean diet, which has been shown to help patients reduce the risk of progressive memory disorders and reduces cardiovascular risk. This includes eating fish, eat fruits and green leafy vegetables, nuts like almonds and hazelnuts, walnuts, and also use olive oil. Avoid fast foods and fried foods as much as possible. Avoid sweets and sugar as sugar use has been linked to worsening of memory function. ? ?There is always a concern of gradual progression of memory problems. If this is the case, then we may need to adjust level of care according to patient needs. Support, both to the patient and caregiver,  should then be put into place.  ? ? ?The Alzheimer?s Association is here all day, every day for people facing Alzheimer?s disease through our free 24/7 Helpline: [phone]. The Helpline provides reliable information and support to all those who need assistance, such as individuals living with memory loss, Alzheimer's or other dementia, caregivers, health care professionals and the public.  ?Our highly trained and knowledgeable staff can help you with: ?Understanding memory loss, dementia and Alzheimer's  ?Medications and other treatment options  ?General information about aging and brain health  ?Skills to provide quality care and to find the best care from professionals  ?Legal, financial and living-arrangement decisions ?Our Helpline also features: ?Confidential care consultation provided by master's level clinicians who can help with decision-making support, crisis assistance and education on issues families face every day  ?Help in a caller's preferred language using our translation service that features more than 200 languages and dialects  ?Referrals to local community programs, services and ongoing support ? ? ? ? ?FALL PRECAUTIONS: Be cautious when walking. Scan the area for obstacles that may increase the risk of trips and falls. When getting up in the mornings, sit up at the edge of the bed for a few minutes before getting out of bed. Consider elevating the bed at the head end to avoid drop of blood pressure when getting up. Walk always in a well-lit room (use night lights in the walls). Avoid area rugs or power cords from appliances in the middle of the walkways. Use a walker or a cane if necessary and consider physical therapy for balance exercise. Get your  eyesight checked regularly. ? ?FINANCIAL OVERSIGHT: Supervision, especially oversight when making financial decisions or transactions is also recommended. ? ?HOME SAFETY: Consider the safety of the kitchen when operating appliances like stoves,  microwave oven, and blender. Consider having supervision and share cooking responsibilities until no longer able to participate in those. Accidents with firearms and other hazards in the house should be identified and addressed as well. ? ? ?ABILITY TO BE LEFT ALONE: If patient is unable to contact 911 operator, consider using LifeLine, or when the need is there, arrange for someone to stay with patients. Smoking is a fire hazard, consider supervision or cessation. Risk of wandering should be assessed by caregiver and if detected at any point, supervision and safe proof recommendations should be instituted. ? ?MEDICATION SUPERVISION: Inability to self-administer medication needs to be constantly addressed. Implement a mechanism to ensure safe administration of the medications. ? ? ?DRIVING: Regarding driving, in patients with progressive memory problems, driving will be impaired. We advise to have someone else do the driving if trouble finding directions or if minor accidents are reported. Independent driving assessment is available to determine safety of driving. ? ? ? ? ?Mediterranean Diet ?A Mediterranean diet refers to food and lifestyle choices that are based on the traditions of countries located on the [employer]. This way of eating has been shown to help prevent certain conditions and improve outcomes for people who have chronic diseases, like kidney disease and heart disease. ?What are tips for following this plan? ?Lifestyle  ?Cook and eat meals together with your family, when possible. ?Drink enough fluid to keep your urine clear or pale yellow. ?Be physically active every day. This includes: ?Aerobic exercise like running or swimming. ?Leisure activities like gardening, walking, or housework. ?Get 7-8 hours of sleep each night. ?If recommended by your health care provider, drink red wine in moderation. This means 1 glass a day for nonpregnant women and 2 glasses a day for men. A glass of wine  equals 5 oz (150 mL). ?Reading food labels  ?Check the serving size of packaged foods. For foods such as rice and pasta, the serving size refers to the amount of cooked product, not dry. ?Check the total fat in packaged foods. Avoid foods that have saturated fat or trans fats. ?Check the ingredients list for added sugars, such as corn syrup. ?Shopping  ?At the grocery store, buy most of your food from the areas near the walls of the store. This includes: ?Fresh fruits and vegetables (produce). ?Grains, beans, nuts, and seeds. Some of these may be available in unpackaged forms or large amounts (in bulk). ?Fresh seafood. ?Poultry and eggs. ?Low-fat dairy products. ?Buy whole ingredients instead of prepackaged foods. ?Buy fresh fruits and vegetables in-season from local farmers markets. ?Buy frozen fruits and vegetables in resealable bags. ?If you do not have access to quality fresh seafood, buy precooked frozen shrimp or canned fish, such as tuna, salmon, or sardines. ?Buy small amounts of raw or cooked vegetables, salads, or olives from the deli or salad bar at your store. ?Stock your pantry so you always have certain foods on hand, such as olive oil, canned tuna, canned tomatoes, rice, pasta, and beans. ?Cooking  ?Cook foods with extra-virgin olive oil instead of using butter or other vegetable oils. ?Have meat as a side dish, and have vegetables or grains as your main dish. This means having meat in small portions or adding small amounts of meat to foods like pasta or stew. ?Use beans  or vegetables instead of meat in common dishes like chili or lasagna. ?Experiment with different cooking methods. Try roasting or broiling vegetables instead of steaming or saut?eing them. ?Add frozen vegetables to soups, stews, pasta, or rice. ?Add nuts or seeds for added healthy fat at each meal. You can add these to yogurt, salads, or vegetable dishes. ?Marinate fish or vegetables using olive oil, lemon juice, garlic, and fresh  herbs. ?Meal planning  ?Plan to eat 1 vegetarian meal one day each week. Try to work up to 2 vegetarian meals, if possible. ?Eat seafood 2 or more times a week. ?Have healthy snacks readily available, such as: ?[place]

## 2022-01-24 NOTE — Progress Notes (Signed)
? ?Assessment/Plan:  ? ? ?Dementia, moderate to severe, without behavioral disturbance ? ?Progression of the disease is noted.  She is unable to tell the date, day of the week, provide names, delayed recall 0.  Very poor insight into her condition.  Exam is unremarkable.  Her thyroid is better controlled by PCP.  Patient is on memantine 10 mg twice daily. ?discussed safety both in and out of the home.  She may need 24/7 monitoring ?Discussed the importance of regular daily schedule with inclusion of crossword puzzles to maintain brain function.  ?Continue to monitor mood by PCP ?Stay active at least 30 minutes at least 3 times a week.  ?Naps should be scheduled and should be no longer than 60 minutes and should not occur after 2 PM.  ?Mediterranean diet is recommended  ?Control cardiovascular risk factors  ?Follow-up hypothyroidism by her PCP ?Continue memantine 10 mg twice daily, side effects discussed  ?Start donepezil 5 mg daily for 2 weeks, then increase to 10 mg daily if tolerated only.Her husband would like to retry Aricept, as her memory is worse.  Apparently, the patient had been seen at the ED in 2021 with panic attacks, but there is no indication that she was allergic to it.  At the time, she had mood changes, benign positional vertigo and thyroid issues. He understands that if there is any poor tolerance to the medication, this must be discontinued immediately.  Of note, donepezil is not listed as an allergy in the chart.   ? Follow up in 6  months. ? ? ?Case discussed with Dr. Tomi Likens  who agrees with the plan ? ? ?Subjective:  ? ? ?Darlene Ballard is a very pleasant 76 y.o. RH female   with a history of hypertension, hypothyroidism and a history of late onset dementia likely due to Alzheimer's disease without behavioral disturbance, seen today in follow up for memory loss. This patient is accompanied in the office by her  husband who supplements the history.  Previous records as well as any outside  records available were reviewed prior to todays visit.  Patient was last seen at our office on 12/11/20  at which time her MoCA was 4/30. As mentioned previously, she has minimal insight into her condition, and currently, does not appear to understand the discussion today.  She is on memantine 10 mg twice daily.  Prior, she appeared to have taken donepezil, but her husband states that she did not have any side effects that he knows of with that med.  He is requesting that she tries it again as she is more forgetful, misplacing objects, and there are significant hygiene concerns..  Since her last visit, she has been closely followed by PCP especially about her thyroid issues, . She continues to repeat herself, but not only her short-term but her long-term memory is now affected.  She no longer drives.  Her husband is in charge of the medications, and her finances.  They have 9 dogs and she takes care of them without issues, and likes to walk them. She denies any headaches, dizziness, diplopia, dysarthria, dysphagia, neck/back pain, focal numbness/tingling/weakness, bowel/bladder dysfunction. No anosmia, tremors, no falls. She reports her mood is okay, she denies any increased irritability, her husband notes she gets mad at herself when she cannot find something. No hallucinations. Sleep is good, no wandering behaviors.  ?  ?MRI brain without contrast done 08/2019 which did not show any acute changes, there was mild diffuse atrophy and mild  chronic microvascular disease.  ?  ? ?  ?Initial visit 12/11/20 This is a 76 year old right-handed woman with a history of hypertension, hypothyroidism, presenting for evaluation of dementia. She was previously seen at [facility] but apparently had a falling out with her daughter who had brought her for memory evaluation, now asking "to start fresh" where her daughter has not taken her in the past. She is accompanied by her husband, who reports they had a complicated  relationship and had been living separate lives in the same house since 2004, until they both had a falling out with their daughter and he took over with her care 2 years ago when pandemic started. When asked about her memory, she states she forgets things sometimes. Her husband reports starting to notice memory changes 6 months ago. She was evaluated at [facility] in 07/2019 where daughter reported memory changes since 2019, worsened in 2020. She repeated herself, got lost driving sometimes. She is noted to have a history of hypothyroidism but stopped thyroid medication and Xanax abruptly around 2017 when she was helping her mother dealing with dementia. She retired as a [employer]chant [employer] officer at age 75. She was last seen at [facility] in June 2021, at that time, it was noted she stopped all medications (side effects on Donepezil) the month prior, which coincided with the time her daughter suggested she no longer drive. Her husband reports she drives minimally, she got lost driving 4 months ago. Around May/June 2021, she withdrew money from her checking account to buy her own car, her daughter found out, returned the money, then she mended her relationship with her husband and they went and bought a car together. She was behind her house payment since 2020 and her daughter was trying to help catch this up. Her husband states that their daughter was taking money from both of them, he has been managing finances for at least 6 months now. He reports that she misplaces things and forgets things, they have put buzzers on her pocketbook and keys because she loses them 5 times a day. Her husband manages meals. He denies any hygiene concerns, she is independent with dressing and bathing, they have 9 dogs which she takes care of without issues. There is a family history of dementia in her maternal grandparents, mother, maternal aunts. She denies any significant head injuries, no alcohol use.  ?  ?She denies any headaches, dizziness, diplopia,  dysarthria, dysphagia, neck/back pain, focal numbness/tingling/weakness, bowel/bladder dysfunction. No anosmia, tremors, no falls. She reports her mood is okay, she denies any increased irritability, her husband notes she gets mad at herself when she cannot find something. No hallucinations. Sleep is good, no wandering behaviors.  ?  ?I personally reviewed MRI brain without contrast done 08/2019 which did not show any acute changes, there was mild diffuse atrophy and mild chronic microvascular disease.  ?  ?Laboratory Data: ?Recent Labs  ?     ?Lab Results  ?Component Value Date  ?  TSH 5.910 (H) 09/17/2020  ?  ?  ?Recent Labs  ?     ?Lab Results  ?Component Value Date  ?  [id number] 09/17/2020  ?  ?  ? ?PREVIOUS MEDICATIONS:  ? ?CURRENT MEDICATIONS:  ?Outpatient Encounter Medications as of 01/24/2022  ?Medication Sig  ? memantine (NAMENDA) 10 MG tablet Take 1 tablet (10 mg total) by mouth 2 (two) times daily. Take 1 tablet every night for 2 weeks, then increase to 1 tablet twice a day  ? ?  No facility-administered encounter medications on file as of 01/24/2022.  ? ? ? ?Objective:  ?  ? ?PHYSICAL EXAMINATION:   ? ?VITALS:   ?Vitals:  ? 01/24/22 1058  ?BP: (!) 146/83  ?Pulse: 73  ?Resp: 18  ?SpO2: 98%  ?Weight: 151 lb (68.5 kg)  ?Height: '5\' 5"'$  (1.651 m)  ? ? ?GEN:  The patient appears stated age and is in NAD. ?HEENT:  Normocephalic, atraumatic.  ? ?Neurological examination: ? ?General: NAD, well-groomed, appears stated age. ?Orientation: The patient is alert. Oriented to person, not to place or date. Not engaged in conversation ?Cranial nerves: There is good facial symmetry.The speech is not fluent but clear. No aphasia or dysarthria. Fund of knowledge is reduced. Recent and remote memory are impaired. Attention and concentration are reduced.  Unable to name objects and repeat phrases.  Hearing is intact to conversational tone.    ?Sensation: Sensation is intact to light touch throughout ?Motor: Strength is at  least antigravity x4. ?Tremors: none  ?DTR's 2/4 in UE/LE  ? ? ?  12/11/2020  ?  8:52 AM  ?Montreal Cognitive Assessment   ?Visuospatial/ Executive (0/5) 0  ?Naming (0/3) 1  ?Attention: Read list of digits (0/2) 1  ?Attent

## 2022-02-18 DIAGNOSIS — D485 Neoplasm of uncertain behavior of skin: Secondary | ICD-10-CM | POA: Diagnosis not present

## 2022-02-18 DIAGNOSIS — L820 Inflamed seborrheic keratosis: Secondary | ICD-10-CM | POA: Diagnosis not present

## 2022-05-08 ENCOUNTER — Telehealth: Payer: Self-pay | Admitting: Physician Assistant

## 2022-05-08 NOTE — Telephone Encounter (Signed)
Patient's spouse Lanny Hurst called to request to move his wife up to sooner. He had the patient nearby and could not talk. He'd like a call back from clinical staff and he'll try to step away.

## 2022-05-08 NOTE — Telephone Encounter (Signed)
Spoke with pt husband informed he said her Demenita has gotten really bad she is seeing people at night she is asking about her mom who passed 2 nights ago. She hides the remote and dog food bowels he yells and screams at him, and he needs, help. He was advised to call the pt PCP to get her checked for a UTI. He stated he was calling the PCP when we hang up the phone,

## 2022-05-09 NOTE — Telephone Encounter (Signed)
Pt is going to PCP to be checked for UTI and they want the Aug 18 at 11:30 appointment message sent to the front to schedule her

## 2022-05-09 NOTE — Telephone Encounter (Signed)
Scheduled 05/30/22 at 11:30.

## 2022-05-30 ENCOUNTER — Ambulatory Visit: Payer: PPO | Admitting: Physician Assistant

## 2022-05-30 ENCOUNTER — Encounter: Payer: Self-pay | Admitting: Physician Assistant

## 2022-05-30 DIAGNOSIS — Z029 Encounter for administrative examinations, unspecified: Secondary | ICD-10-CM

## 2022-06-06 ENCOUNTER — Encounter: Payer: Self-pay | Admitting: Physician Assistant

## 2022-06-06 ENCOUNTER — Ambulatory Visit: Payer: PPO | Admitting: Physician Assistant

## 2022-06-06 VITALS — BP 123/80 | HR 69 | Resp 18 | Wt 146.0 lb

## 2022-06-06 DIAGNOSIS — G301 Alzheimer's disease with late onset: Secondary | ICD-10-CM

## 2022-06-06 DIAGNOSIS — F02818 Dementia in other diseases classified elsewhere, unspecified severity, with other behavioral disturbance: Secondary | ICD-10-CM | POA: Diagnosis not present

## 2022-06-06 NOTE — Progress Notes (Signed)
Assessment/Plan:   Dementia due to Alzheimer's disease with behavioral disturbance  Darlene Ballard is a very pleasant 76 y.o. RH female with a history of hypertension, hyperlipidemia, hypothyroidism, anxiety seen today in follow up for memory loss. Patient is currently on memantine 10 mg twice daily and donepezil 10 mg daily, tolerating well. MMSE today is   /30  with delayed recall  /3       Follow up in 6 months. Continue memantine 10 mg daily and donepezil 10 mg daily Continue to control mood as per PCP    Subjective:    This patient is accompanied in the office by  who supplements the history.  Previous records as well as any outside records available were reviewed prior to todays visit.    Any changes in memory since last visit? Patient lives with:  repeats oneself?  Endorsed Disoriented when walking into a room?  Patient denies   Leaving objects in unusual places?  "She hides the remote control "husband says. Ambulates  with difficulty?   Patient denies   Recent falls?  Patient denies   Any head injuries?  Patient denies   History of seizures?   Patient denies   Wandering behavior?  Patient denies   Patient drives?   Patient no longer drives  Any mood changes since last visit?  She has been screaming at her husband more often.   Any worsening depression?:  Patient denies   Hallucinations?  Endorsed.  She sees people at night, including her mother. Paranoia?  Patient denies   Patient reports that sleeps well without vivid dreams, REM behavior or sleepwalking   History of sleep apnea?  Patient denies   Any hygiene concerns?  Patient denies   Independent of bathing and dressing?  Endorsed  Does the patient needs help with medications?  Denies Who is in charge of the finances?   is in charge    Any changes in appetite?  Patient denies   Patient have trouble swallowing? Patient denies   Does the patient cook?  Patient denies   Any kitchen accidents such as leaving the  stove on? Patient denies   Any headaches?  Patient denies   Double vision? Patient denies   Any focal numbness or tingling?  Patient denies   Chronic back pain Patient denies   Unilateral weakness?  Patient denies   Any tremors?  Patient denies   Any history of anosmia?  Patient denies   Any incontinence of urine?  Patient denies   Any bowel dysfunction?   Patient denies         Initial visit 12/11/20 This is a 76 year old right-handed woman with a history of hypertension, hypothyroidism, presenting for evaluation of dementia. She was previously seen at [facility] but apparently had a falling out with her daughter who had brought her for memory evaluation, now asking "to start fresh" where her daughter has not taken her in the past. She is accompanied by her husband, who reports they had a complicated relationship and had been living separate lives in the same house since 2004, until they both had a falling out with their daughter and he took over with her care 2 years ago when pandemic started. When asked about her memory, she states she forgets things sometimes. Her husband reports starting to notice memory changes 6 months ago. She was evaluated at [facility] in 07/2019 where daughter reported memory changes since 2019, worsened in 2020. She repeated herself, got lost  driving sometimes. She is noted to have a history of hypothyroidism but stopped thyroid medication and Xanax abruptly around 2017 when she was helping her mother dealing with dementia. She retired as a [employer]chant [employer] officer at age 31. She was last seen at [facility] in June 2021, at that time, it was noted she stopped all medications (side effects on Donepezil) the month prior, which coincided with the time her daughter suggested she no longer drive. Her husband reports she drives minimally, she got lost driving 4 months ago. Around May/June 2021, she withdrew money from her checking account to buy her own car, her daughter found out, returned the  money, then she mended her relationship with her husband and they went and bought a car together. She was behind her house payment since 2020 and her daughter was trying to help catch this up. Her husband states that their daughter was taking money from both of them, he has been managing finances for at least 6 months now. He reports that she misplaces things and forgets things, they have put buzzers on her pocketbook and keys because she loses them 5 times a day. Her husband manages meals. He denies any hygiene concerns, she is independent with dressing and bathing, they have 9 dogs which she takes care of without issues. There is a family history of dementia in her maternal grandparents, mother, maternal aunts. She denies any significant head injuries, no alcohol use.    She denies any headaches, dizziness, diplopia, dysarthria, dysphagia, neck/back pain, focal numbness/tingling/weakness, bowel/bladder dysfunction. No anosmia, tremors, no falls. She reports her mood is okay, she denies any increased irritability, her husband notes she gets mad at herself when she cannot find something. No hallucinations. Sleep is good, no wandering behaviors.    I personally reviewed MRI brain without contrast done 08/2019 which did not show any acute changes, there was mild diffuse atrophy and mild chronic microvascular disease.  PREVIOUS MEDICATIONS:   CURRENT MEDICATIONS:  Outpatient Encounter Medications as of 06/06/2022  Medication Sig   donepezil (ARICEPT) 10 MG tablet Take half tablet (5 mg) daily for 2 weeks, then increase to the full tablet at 10 mg daily   memantine (NAMENDA) 10 MG tablet Take 1 tablet (10 mg total) by mouth 2 (two) times daily. Take 1 tablet every night for 2 weeks, then increase to 1 tablet twice a day   No facility-administered encounter medications on file as of 06/06/2022.       03/21/2020    3:00 PM 01/03/2020    7:19 AM 08/09/2019    3:15 PM  MMSE - Mini Mental State Exam   Orientation to time 2 0 3  Orientation to Place '3 3 4  '$ Registration '3 3 3  '$ Attention/ Calculation '1 1 1  '$ Recall 0 0 0  Language- name 2 objects '2 2 2  '$ Language- repeat '1 1 1  '$ Language- follow 3 step command '3 3 3  '$ Language- read & follow direction '1 1 1  '$ Write a sentence '1 1 1  '$ Copy design '1 1 1  '$ Total score '18 16 20      '$ 12/11/2020    8:52 AM  Montreal Cognitive Assessment   Visuospatial/ Executive (0/5) 0  Naming (0/3) 1  Attention: Read list of digits (0/2) 1  Attention: Read list of letters (0/1) 0  Attention: Serial 7 subtraction starting at 100 (0/3) 1  Language: Repeat phrase (0/2) 0  Language : Fluency (0/1) 0  Abstraction (0/2) 1  Delayed  Recall (0/5) 0  Orientation (0/6) 0  Total 4  Adjusted Score (based on education) 4    Objective:     PHYSICAL EXAMINATION:    VITALS:  There were no vitals filed for this visit.  GEN:  The patient appears stated age and is in NAD. HEENT:  Normocephalic, atraumatic.   Neurological examination:  General: NAD, well-groomed, appears stated age. Orientation: The patient is alert. Oriented to person, place and date Cranial nerves: There is good facial symmetry.The speech is fluent and clear. No aphasia or dysarthria. Fund of knowledge is appropriate. Recent and remote memory are impaired. Attention and concentration are reduced.  Able to name objects and repeat phrases.  Hearing is intact to conversational tone.    Sensation: Sensation is intact to light touch throughout Motor: Strength is at least antigravity x4. Tremors: none  DTR's 2/4 in UE/LE     Movement examination: Tone: There is normal tone in the UE/LE Abnormal movements:  no tremor.  No myoclonus.  No asterixis.   Coordination:  There is no decremation with RAM's. Normal finger to nose  Gait and Station: The patient has no difficulty arising out of a deep-seated chair without the use of the hands. The patient's stride length is good.  Gait is cautious and  narrow.    Thank you for allowing [place] the opportunity to participate in the care of this nice patient. Please do not hesitate to contact us for any questions or concerns.   Total time spent on today's visit was *** minutes dedicated to this patient today, preparing to see patient, examining the patient, ordering tests and/or medications and counseling the patient, documenting clinical information in the EHR or other health record, independently interpreting results and communicating results to the patient/family, discussing treatment and goals, answering patient's questions and coordinating care.  Cc:  Darla Lesches 06/06/2022 6:57 AM

## 2022-06-06 NOTE — Patient Instructions (Signed)
It was a pleasure to see you today at our office.   Recommendations:  Follow up in  6 months Continue donepezil 10 mg daily. Side effects were discussed  Continue Memantine 10 mg twice daily.Side effects were discussed  Consider memory care   Whom to call:  Memory  decline, memory medications: Call our office [phone]   For psychiatric meds, mood meds: Please have your primary care physician manage these medications.   Counseling regarding caregiver distress, including caregiver depression, anxiety and issues regarding community resources, adult day care programs, adult living facilities, or memory care questions:   Feel free to contact Misty Taylor Palladino, Social Worker at [phone]   For assessment of decision of mental capacity and competency:  Call Dr. Michelle Haber, geriatric psychiatrist at [phone]  For guidance in geriatric dementia issues please call [facility] [phone]  For guidance regarding [facility] and if placement were needed at the facility, contact Nicole Reynolds, Social Worker tel: [phone]  If you have any severe symptoms of a stroke, or other severe issues such as confusion,severe chills or fever, etc call 911 or go to the ER as you may need to be evaluated further   Feel free to visit Facebook page " [email or web address]" for tips of how to care for people with memory problems.      RECOMMENDATIONS FOR ALL PATIENTS WITH MEMORY PROBLEMS: 1. Continue to exercise (Recommend 30 minutes of walking everyday, or 3 hours every week) 2. Increase social interactions - continue going to Church and enjoy social gatherings with friends and family 3. Eat healthy, avoid fried foods and eat more fruits and vegetables 4. Maintain adequate blood pressure, blood sugar, and blood cholesterol level. Reducing the risk of stroke and cardiovascular disease also helps promoting better memory. 5. Avoid stressful situations. Live a simple life  and avoid aggravations. Organize your time and prepare for the next day in anticipation. 6. Sleep well, avoid any interruptions of sleep and avoid any distractions in the bedroom that may interfere with adequate sleep quality 7. Avoid sugar, avoid sweets as there is a strong link between excessive sugar intake, diabetes, and cognitive impairment We discussed the Mediterranean diet, which has been shown to help patients reduce the risk of progressive memory disorders and reduces cardiovascular risk. This includes eating fish, eat fruits and green leafy vegetables, nuts like almonds and hazelnuts, walnuts, and also use olive oil. Avoid fast foods and fried foods as much as possible. Avoid sweets and sugar as sugar use has been linked to worsening of memory function.  There is always a concern of gradual progression of memory problems. If this is the case, then we may need to adjust level of care according to patient needs. Support, both to the patient and caregiver, should then be put into place.    The Alzheimer's Association is here all day, every day for people facing Alzheimer's disease through our free 24/7 Helpline: [phone]. The Helpline provides reliable information and support to all those who need assistance, such as individuals living with memory loss, Alzheimer's or other dementia, caregivers, health care professionals and the public.  Our highly trained and knowledgeable staff can help you with: Understanding memory loss, dementia and Alzheimer's  Medications and other treatment options  General information about aging and brain health  Skills to provide quality care and to find the best care from professionals  Legal, financial and living-arrangement decisions Our Helpline also features: Confidential care consultation provided by master's level   clinicians who can help with decision-making support, crisis assistance and education on issues families face every day  Help in a caller's  preferred language using our translation service that features more than 200 languages and dialects  Referrals to local community programs, services and ongoing support     FALL PRECAUTIONS: Be cautious when walking. Scan the area for obstacles that may increase the risk of trips and falls. When getting up in the mornings, sit up at the edge of the bed for a few minutes before getting out of bed. Consider elevating the bed at the head end to avoid drop of blood pressure when getting up. Walk always in a well-lit room (use night lights in the walls). Avoid area rugs or power cords from appliances in the middle of the walkways. Use a walker or a cane if necessary and consider physical therapy for balance exercise. Get your eyesight checked regularly.  FINANCIAL OVERSIGHT: Supervision, especially oversight when making financial decisions or transactions is also recommended.  HOME SAFETY: Consider the safety of the kitchen when operating appliances like stoves, microwave oven, and blender. Consider having supervision and share cooking responsibilities until no longer able to participate in those. Accidents with firearms and other hazards in the house should be identified and addressed as well.   ABILITY TO BE LEFT ALONE: If patient is unable to contact 911 operator, consider using LifeLine, or when the need is there, arrange for someone to stay with patients. Smoking is a fire hazard, consider supervision or cessation. Risk of wandering should be assessed by caregiver and if detected at any point, supervision and safe proof recommendations should be instituted.  MEDICATION SUPERVISION: Inability to self-administer medication needs to be constantly addressed. Implement a mechanism to ensure safe administration of the medications.   DRIVING: Regarding driving, in patients with progressive memory problems, driving will be impaired. We advise to have someone else do the driving if trouble finding  directions or if minor accidents are reported. Independent driving assessment is available to determine safety of driving.   If you are interested in the driving assessment, you can contact the following:  [employer] in [place]  [place] [phone] or [phone]      [place] refers to food and lifestyle choices that are based on the traditions of countries located on the [employer]. This way of eating has been shown to help prevent certain conditions and improve outcomes for people who have chronic diseases, like kidney disease and heart disease. What are tips for following this plan? Lifestyle  Cook and eat meals together with your family, when possible. Drink enough fluid to keep your urine clear or pale yellow. Be physically active every day. This includes: Aerobic exercise like running or swimming. Leisure activities like gardening, walking, or housework. Get 7-8 hours of sleep each night. If recommended by your health care provider, drink red wine in moderation. This means 1 glass a day for nonpregnant women and 2 glasses a day for men. A glass of wine equals 5 oz (150 mL). Reading food labels  Check the serving size of packaged foods. For foods such as rice and pasta, the serving size refers to the amount of cooked product, not dry. Check the total fat in packaged foods. Avoid foods that have saturated fat or trans fats. Check the ingredients list for added sugars, such as corn syrup. Shopping  At the  grocery store, buy most of your food from the areas near the walls of the store. This includes: Fresh fruits and vegetables (produce). Grains, beans, nuts, and seeds. Some of these may be available in unpackaged forms or large amounts (in bulk). Fresh seafood. Poultry and eggs. Low-fat dairy products. Buy whole  ingredients instead of prepackaged foods. Buy fresh fruits and vegetables in-season from local farmers markets. Buy frozen fruits and vegetables in resealable bags. If you do not have access to quality fresh seafood, buy precooked frozen shrimp or canned fish, such as tuna, salmon, or sardines. Buy small amounts of raw or cooked vegetables, salads, or olives from the deli or salad bar at your store. Stock your pantry so you always have certain foods on hand, such as olive oil, canned tuna, canned tomatoes, rice, pasta, and beans. Cooking  Cook foods with extra-virgin olive oil instead of using butter or other vegetable oils. Have meat as a side dish, and have vegetables or grains as your main dish. This means having meat in small portions or adding small amounts of meat to foods like pasta or stew. Use beans or vegetables instead of meat in common dishes like chili or lasagna. Experiment with different cooking methods. Try roasting or broiling vegetables instead of steaming or sauteing them. Add frozen vegetables to soups, stews, pasta, or rice. Add nuts or seeds for added healthy fat at each meal. You can add these to yogurt, salads, or vegetable dishes. Marinate fish or vegetables using olive oil, lemon juice, garlic, and fresh herbs. Meal planning  Plan to eat 1 vegetarian meal one day each week. Try to work up to 2 vegetarian meals, if possible. Eat seafood 2 or more times a week. Have healthy snacks readily available, such as: Vegetable sticks with hummus. Greek yogurt. Fruit and nut trail mix. Eat balanced meals throughout the week. This includes: Fruit: 2-3 servings a day Vegetables: 4-5 servings a day Low-fat dairy: 2 servings a day Fish, poultry, or lean meat: 1 serving a day Beans and legumes: 2 or more servings a week Nuts and seeds: 1-2 servings a day Whole grains: 6-8 servings a day Extra-virgin olive oil: 3-4 servings a day Limit red meat and sweets to only a few servings  a month What are my food choices? Mediterranean diet Recommended Grains: Whole-grain pasta. Brown rice. Bulgar wheat. Polenta. Couscous. Whole-wheat bread. [place]. Vegetables: Artichokes. Beets. Broccoli. Cabbage. Carrots. Eggplant. Green beans. Chard. Kale. Spinach. Onions. Leeks. Peas. Squash. Tomatoes. Peppers. Radishes. Fruits: Apples. Apricots. Avocado. Berries. Bananas. Cherries. Dates. Figs. Grapes. Lemons. Melon. Oranges. Peaches. Plums. Pomegranate. Meats and other protein foods: Beans. Almonds. Sunflower seeds. Pine nuts. Peanuts. [place]. Salmon. Scallops. Shrimp. [place]. Tilapia. Clams. Oysters. Eggs. Dairy: Low-fat milk. Cheese. Greek yogurt. Beverages: Water. Red wine. Herbal tea. Fats and oils: Extra virgin olive oil. Avocado oil. Grape seed oil. Sweets and desserts: [place] yogurt with honey. Baked apples. Poached pears. Trail mix. Seasoning and other foods: Basil. Cilantro. Coriander. Cumin. Mint. Parsley. Sage. Rosemary. Tarragon. Garlic. Oregano. Thyme. Pepper. Balsalmic vinegar. Tahini. Hummus. Tomato sauce. Olives. Mushrooms. Limit these Grains: Prepackaged pasta or rice dishes. Prepackaged cereal with added sugar. Vegetables: Deep fried potatoes (french fries). Fruits: Fruit canned in syrup. Meats and other protein foods: Beef. Pork. Lamb. Poultry with skin. Hot dogs. Berniece Salines. Dairy: Ice cream. Sour cream. Whole milk. Beverages: Juice. Sugar-sweetened soft drinks. Beer. Liquor and spirits. Fats and oils: Butter. Canola oil. Vegetable oil. Beef fat (tallow). Lard. Sweets and desserts: Cookies. Cakes. Pies.  Candy. Seasoning and other foods: Mayonnaise. Premade sauces and marinades. The items listed may not be a complete list. Talk with your dietitian about what dietary choices are right for you. Summary The Mediterranean diet includes both food and lifestyle choices. Eat a variety of fresh fruits and vegetables, beans, nuts, seeds, and whole grains. Limit the amount of  red meat and sweets that you eat. Talk with your health care provider about whether it is safe for you to drink red wine in moderation. This means 1 glass a day for nonpregnant women and 2 glasses a day for men. A glass of wine equals 5 oz (150 mL). This information is not intended to replace advice given to you by your health care provider. Make sure you discuss any questions you have with your health care provider. Document Released: 05/22/2016 Document Revised: 06/24/2016 Document Reviewed: 05/22/2016 Elsevier Interactive Patient Education  2017 Elsevier Inc.     

## 2022-06-10 ENCOUNTER — Encounter: Payer: Self-pay | Admitting: Medical

## 2022-06-10 ENCOUNTER — Telehealth: Payer: Self-pay | Admitting: Medical

## 2022-06-10 ENCOUNTER — Ambulatory Visit (INDEPENDENT_AMBULATORY_CARE_PROVIDER_SITE_OTHER): Payer: PPO | Admitting: Medical

## 2022-06-10 VITALS — BP 128/78 | HR 76 | Ht 63.0 in | Wt 143.0 lb

## 2022-06-10 DIAGNOSIS — G8929 Other chronic pain: Secondary | ICD-10-CM | POA: Diagnosis not present

## 2022-06-10 DIAGNOSIS — M25562 Pain in left knee: Secondary | ICD-10-CM | POA: Diagnosis not present

## 2022-06-10 DIAGNOSIS — I10 Essential (primary) hypertension: Secondary | ICD-10-CM | POA: Diagnosis not present

## 2022-06-10 DIAGNOSIS — E782 Mixed hyperlipidemia: Secondary | ICD-10-CM

## 2022-06-10 DIAGNOSIS — Z7189 Other specified counseling: Secondary | ICD-10-CM | POA: Diagnosis not present

## 2022-06-10 DIAGNOSIS — Z7185 Encounter for immunization safety counseling: Secondary | ICD-10-CM | POA: Diagnosis not present

## 2022-06-10 DIAGNOSIS — R1900 Intra-abdominal and pelvic swelling, mass and lump, unspecified site: Secondary | ICD-10-CM

## 2022-06-10 DIAGNOSIS — F0390 Unspecified dementia without behavioral disturbance: Secondary | ICD-10-CM | POA: Diagnosis not present

## 2022-06-10 DIAGNOSIS — F419 Anxiety disorder, unspecified: Secondary | ICD-10-CM

## 2022-06-10 DIAGNOSIS — M25561 Pain in right knee: Secondary | ICD-10-CM | POA: Diagnosis not present

## 2022-06-10 DIAGNOSIS — E039 Hypothyroidism, unspecified: Secondary | ICD-10-CM

## 2022-06-10 DIAGNOSIS — Z0000 Encounter for general adult medical examination without abnormal findings: Secondary | ICD-10-CM | POA: Diagnosis not present

## 2022-06-10 NOTE — Telephone Encounter (Signed)
Hello Sarah.  I saw this patient for the first time today in conjunction with her husband.  Her husband sees Dr. Redmond School here.    I strongly recommended a home health eval.  He declined this today.  I recommend he consider SNF but he also declined this today as he is willing to take care of her for now.  He did note that they have over 40 cats and at least 8 or 9 puppies in the house.  This sounds like a very unhygienic issue and a disaster.  I was not sure if you are aware of this as well.  He wanted me to complete advance directives today but the very first clause in the paperwork would require her being of sound mind which she is not.  This is the second time he has brought this paperwork to me in the past year I believe.  I had deferred him to psychiatry to get some type of declaration of incompetence to help complete what ever legal documents they need to finalize.  However he tells me today that there was some issue with the cost and supposedly they were not going to complete the paperwork unless he paid $500.  I am not sure about all of the situation.  I do not have any of those psychiatry records.  I also felt that if he is a spouse he would by default be power of attorney any now if she is not able to take care of herself due to dementia.  So I am not sure he needs advanced directives at this point.  Any thoughts or recommendations for this patient in regards to legal paperwork?  I think the bigger question is do we forced a home health aide now, do we talk to social services about doing a health check at the home?  I am not real familiar with him and his wife so I wanted to get your opinion

## 2022-06-10 NOTE — Progress Notes (Signed)
En Subjective:    Darlene Ballard is a 76 y.o. female who presents for Preventative Services visit and chronic medical problems/med check visit.    This is my first visit with patient today. Here today with husband.  She decided she didn't want him in the room initially.  I did speak to husband separately.  History was provided by both husband and wife separately.  Primary Care Provider Yekaterina Escutia, Camelia Eng, PA-C here  establishing today for primary care.  Was seeing Harland Dingwall, NP and Francis Gaines PA-C prior here.  Current Health Care Team: Sharene Butters, PA-C, neurology Dr. Lorenda Peck, podiatry   Medical Services you may have received from other than [facility] providers in the past year (date may be approximate) neurology  Exercise Current exercise habits: walking at home  Nutrition/Diet Current diet: average  Depression Screen    06/10/2022   11:40 AM  Depression screen PHQ 2/9  Decreased Interest 0  Down, Depressed, Hopeless 0  PHQ - 2 Score 0    Activities of Daily Living Screen/Functional Status Survey Is the patient deaf or have difficulty hearing?: No Does the patient have difficulty seeing, even when wearing glasses/contacts?: Yes (she states she wears glasses) Does the patient have difficulty concentrating, remembering, or making decisions?: Yes Does the patient have difficulty walking or climbing stairs?: No Does the patient have difficulty dressing or bathing?: No Does the patient have difficulty doing errands alone such as visiting a doctor's office or shopping?: No  Can patient draw a clock face showing 3:15 oclock, no  Fall Risk Screen    06/10/2022   11:40 AM 06/06/2022    9:04 AM 01/24/2022   10:57 AM 12/02/2021    8:06 AM 06/09/2021   10:29 AM  [place] in the past year? 0 0 0 0 0  Number falls in past yr: 0 0 0 0   Injury with Fall? 0 0 0 0   Risk for fall due to : No Fall Risks   No Fall Risks   Follow up Falls evaluation completed   Falls  evaluation completed     Gait Assessment: Normal gait observed yes  Advanced directives Does patient have a [place]? Yes Does patient have a Living Will? Yes  Past Medical History:  Diagnosis Date   Memory loss     Past Surgical History:  Procedure Laterality Date   TONSILLECTOMY AND ADENOIDECTOMY      Social History   Socioeconomic History   Marital status: Married    Spouse name: Not on file   Number of children: 1   Years of education: 2 years college   Highest education level: Not on file  Occupational History   Occupation: Retired  Tobacco Use   Smoking status: Never   Smokeless tobacco: Never  Vaping Use   Vaping Use: Never used  Substance and Sexual Activity   Alcohol use: Never   Drug use: Never   Sexual activity: Not Currently  Other Topics Concern   Not on file  Social History Narrative   Lives at home with husband.   Right-handed.   2 cups caffeine per day.   Social Determinants of Health   Financial Resource Strain: Not on file  Food Insecurity: Not on file  Transportation Needs: Not on file  Physical Activity: Not on file  Stress: Not on file  Social Connections: Not on file  Intimate Partner Violence: Not on file  Family History  Problem Relation Age of Onset   Alzheimer's disease Mother    Heart disease Mother    Heart attack Mother    Other Father        blood disorder - unsure of condition     Current Outpatient Medications:    donepezil (ARICEPT) 10 MG tablet, Take half tablet (5 mg) daily for 2 weeks, then increase to the full tablet at 10 mg daily, Disp: 30 tablet, Rfl: 11   memantine (NAMENDA) 10 MG tablet, Take 1 tablet (10 mg total) by mouth 2 (two) times daily. Take 1 tablet every night for 2 weeks, then increase to 1 tablet twice a day, Disp: 60 tablet, Rfl: 11  No Known Allergies  History reviewed: allergies, current medications, past family history, past medical history, past social history,  past surgical history and problem list  Chronic issues discussed: Dementia - on medication per neurology  History of hypertension, hyperlipidemia  but she is not on medication  Hyperthyroidism - not on medication currently  Acute issues discussed: none  Has 9 dogs, puppies .    Objective:     Biometrics BP 128/78   Pulse 76   Ht '5\' 3"'$  (1.6 m)   Wt 143 lb (64.9 kg)   BMI 25.33 kg/m   Cognitive Testing  Alert? Yes  Normal Appearance?Yes except somewhat of a pet odor  Oriented to person? No  Place? No   Time? No  Recall of three objects?  No  Can perform simple calculations? No  Displays appropriate judgment?No  Can read the correct time from a watch face?No  General appearance: alert, no distress, WD/WN, white female  Nutritional Status: Inadequate calore intake? yes Loss of muscle mass? yes Loss of fat beneath skin? no Localized or general edema? no Diminished functional status? yes  Other pertinent exam: HEENT: normocephalic, sclerae anicteric, TMs pearly, nares patent, no discharge or erythema, pharynx normal Oral cavity: MMM, no lesions Neck: supple, no lymphadenopathy, no thyromegaly, no masses Heart: RRR, normal S1, S2, no murmurs Lungs: CTA bilaterally, no wheezes, rhonchi, or rales Abdomen: +bs, soft, non tender, non distended, no masses, no hepatomegaly, no splenomegaly Musculoskeletal: nontender, no swelling, no obvious deformity Extremities: no edema, no cyanosis, no clubbing Pulses: 2+ symmetric, upper and lower extremities, normal cap refill Neurological: alert, strength normal upper extremities and lower extremities, sensation normal throughout, DTRs 2+ throughout, no cerebellar signs, gait normal Psychiatric: pleasant   GU/GYN exam deferred and declined  Assessment:   Encounter Diagnoses  Name Primary?   Encounter for health maintenance examination in adult Yes   Dementia without behavioral disturbance (HCC)    Anxiety    Chronic pain of  both knees    Mixed hyperlipidemia    Hypertension, unspecified type    Hypothyroidism, unspecified type    Vaccine counseling    Advance directive discussed with patient    Medicare annual wellness visit, subsequent      Plan:   A preventative services visit was completed today.  During the course of the visit today, we discussed and counseled about appropriate screening and preventive services.  A health risk assessment was established today that included a review of current medications, allergies, social history, family history, medical and preventative health history, biometrics, and preventative screenings to identify potential safety concerns or impairments.  A personalized plan was printed today for your records and use.   Personalized health advice and education was given today to reduce health risks and promote self  management and wellness.  Information regarding end of life planning was discussed today.  Conditions/risks identified: The primary issue today is dementia. She bathes herself but apparently all other care is being done by her husband.  He has talked with neurology about this and similar to what he told them he does not want to put her in a nursing home care until absolutely necessary.  He feels like he can still handle her needs currently.  I have concerns about the home situation.  I will coordinate with neurology as well.  I offered a home health consult but he declines this today.  I encouraged him to consider nursing home facility but he does not seem to want to go that route right now.  I do have genuine concerns about both of their overall health and wellbeing.  They apparently have over 40 cats and at least 9 puppies at home.  I cannot imagine the hygiene and overall cleanliness of the house much less given husband's chronic pain and ambulatory limitations, neither one of them are probably getting what they need in terms of quality of life and healthy  habits  According to neurology note she is quickly progressing in her dementia.  Nursing home facility makes a lot of sense going forward   Advanced directives discussion: He brought up discussion about advanced directives today.  He brought paperwork wanting me to sign some papers.  After looking at the papers, he wanted me to help complete the healthcare power of attorney and living will with me signing off.  I explained to him that she is not of sound mind and cannot sign for herself in this situation.  Also because he is the spouse he automatically would be a default power of attorney for her medically as far as we are concerned.  I advised if he has other legal matters that we will need some type of declaration of dementia or incontinence he will need to talk with an attorney or magistrate.  Or he needs to go back and talk to neurology about handling these issues.  I advised that I cannot complete these papers for either one of them as this should be done with an attorney given their situation  He apparently talked with psychiatry to help make that determination competence and there was some issue about cost that he was not willing to pay.  I am not sure about the situation.  I recommend he talk with an attorney   On a different note, husband noted that she and him have not had sexual relations in  years since 2004.  He was concerned about some type of growth in the vaginal region.  I will refer her to gynecology for evaluation.  There is also concern about their daughter.  Apparently they have a daughter that has been known to steal from them or tried to obtain financial information from them.  This concerns him.  I again reiterated the need to have him talk to an attorney just to know his legal options and to protect him and his wife assets, particularly if she is going to need skilled nursing care   The chart shows a history of hypertension.  Her blood pressure is currently controlled off  medication.  Routine labs today  Hyperlipidemia listed in the chart record, not on medication.  Updated labs today.  Given her overall health issues, have to weigh the risk versus benefit of treating if her cholesterol is high  Hypothyroidism listed in  the chart record-not currently on medication.  Updated labs today  Anxiety-apparently she does not want to see a psychiatrist and had a consult.  I do not have those records nor do I know the outcome of that visit.  Husband has concerns about some type of growth in the vaginal area or some type of urinary issue.  We will place referral to gynecology.   Recommendations: I recommend a yearly ophthalmology/optometry visit for glaucoma screening and eye checkup I recommended a yearly dental visit for hygiene and checkup Advanced directives - discussed nature and purpose of Advanced Directives, encouraged them to complete them if they have not done so and/or encouraged them to get [place] a copy if they have done this already. I recommend a bone density test to screen for osteoporosis.      Referrals today: Gynecology F/u with neurology   Immunizations: I recommended a yearly influenza vaccine, typically in September when the vaccine is usually available I recommend Shingles vaccine at the pharmacy I recommend an updated pneumococcal vaccine, Prevar 50   Kyarah was seen today for medicare wellness.  Diagnoses and all orders for this visit:  Encounter for health maintenance examination in adult -     Comprehensive metabolic panel -     CBC -     TSH + free T4 -     Lipid panel -     Urinalysis  Dementia without behavioral disturbance (HCC)  Anxiety  Chronic pain of both knees  Mixed hyperlipidemia -     Lipid panel  Hypertension, unspecified type  Hypothyroidism, unspecified type -     TSH + free T4  Vaccine counseling  Advance directive discussed with patient  Medicare annual wellness visit, subsequent     Medicare  Attestation A preventative services visit was completed today.  During the course of the visit the patient was educated and counseled about appropriate screening and preventive services.  A health risk assessment was established with the patient that included a review of current medications, allergies, social history, family history, medical and preventative health history, biometrics, and preventative screenings to identify potential safety concerns or impairments.  A personalized plan was printed today for the patient's records and use.   Personalized health advice and education was given today to reduce health risks and promote self management and wellness.  Information regarding end of life planning was discussed today.  Dorothea Ogle, PA-C   06/10/2022

## 2022-06-11 ENCOUNTER — Other Ambulatory Visit: Payer: Self-pay | Admitting: Medical

## 2022-06-11 LAB — CBC
Hematocrit: 39.9 % (ref 34.0–46.6)
Hemoglobin: 13.4 g/dL (ref 11.1–15.9)
MCH: 31.9 pg (ref 26.6–33.0)
MCHC: 33.6 g/dL (ref 31.5–35.7)
MCV: 95 fL (ref 79–97)
Platelets: 182 10*3/uL (ref 150–450)
RBC: 4.2 x10E6/uL (ref 3.77–5.28)
RDW: 12.5 % (ref 11.7–15.4)
WBC: 6.4 10*3/uL (ref 3.4–10.8)

## 2022-06-11 LAB — COMPREHENSIVE METABOLIC PANEL
ALT: 9 IU/L (ref 0–32)
AST: 21 IU/L (ref 0–40)
Albumin/Globulin Ratio: 1.8 (ref 1.2–2.2)
Albumin: 4.2 g/dL (ref 3.8–4.8)
Alkaline Phosphatase: 94 IU/L (ref 44–121)
BUN/Creatinine Ratio: 13 (ref 12–28)
BUN: 10 mg/dL (ref 8–27)
Bilirubin Total: 0.3 mg/dL (ref 0.0–1.2)
CO2: 25 mmol/L (ref 20–29)
Calcium: 8.9 mg/dL (ref 8.7–10.3)
Chloride: 104 mmol/L (ref 96–106)
Creatinine, Ser: 0.77 mg/dL (ref 0.57–1.00)
Globulin, Total: 2.3 g/dL (ref 1.5–4.5)
Glucose: 87 mg/dL (ref 70–99)
Potassium: 4 mmol/L (ref 3.5–5.2)
Sodium: 144 mmol/L (ref 134–144)
Total Protein: 6.5 g/dL (ref 6.0–8.5)
eGFR: 81 mL/min/{1.73_m2} (ref >59)

## 2022-06-11 LAB — LIPID PANEL
Chol/HDL Ratio: 2.8 ratio (ref 0.0–4.4)
Cholesterol, Total: 189 mg/dL (ref 100–199)
HDL: 68 mg/dL (ref >39)
LDL Chol Calc (NIH): 108 mg/dL — ABNORMAL HIGH (ref 0–99)
Triglycerides: 72 mg/dL (ref 0–149)
VLDL Cholesterol Cal: 13 mg/dL (ref 5–40)

## 2022-06-11 LAB — TSH+FREE T4
Free T4: 0.86 ng/dL (ref 0.82–1.77)
TSH: 6.51 u[IU]/mL — ABNORMAL HIGH (ref 0.450–4.500)

## 2022-06-11 MED ORDER — LEVOTHYROXINE SODIUM 50 MCG PO TABS: 50.0000 ug | ORAL_TABLET | Freq: Every day | ORAL | 1 refills | Status: DC

## 2022-06-11 NOTE — Progress Notes (Signed)
Darlene Ballard

## 2022-07-23 ENCOUNTER — Ambulatory Visit: Payer: PPO | Admitting: Medical

## 2022-07-23 ENCOUNTER — Telehealth: Payer: Self-pay | Admitting: Medical

## 2022-07-23 NOTE — Telephone Encounter (Signed)
Please Send no show letter to patient and call to find out why they missed.   We have a large number of patients needing appointments, and when someone no shows, this prevents others from being able to use those appointment slots!

## 2022-07-25 ENCOUNTER — Ambulatory Visit: Payer: PPO | Admitting: Medical

## 2022-07-29 ENCOUNTER — Ambulatory Visit: Payer: PPO | Admitting: Physician Assistant

## 2022-08-06 ENCOUNTER — Ambulatory Visit (INDEPENDENT_AMBULATORY_CARE_PROVIDER_SITE_OTHER): Payer: PPO | Admitting: Medical

## 2022-08-06 VITALS — BP 110/68 | HR 92 | Wt 146.8 lb

## 2022-08-06 DIAGNOSIS — Z7185 Encounter for immunization safety counseling: Secondary | ICD-10-CM | POA: Diagnosis not present

## 2022-08-06 DIAGNOSIS — E039 Hypothyroidism, unspecified: Secondary | ICD-10-CM | POA: Diagnosis not present

## 2022-08-06 NOTE — Progress Notes (Signed)
Subjective:  Darlene Ballard is a 76 y.o. female who presents for Chief Complaint  Patient presents with   follow-up on thyroid    Follow-up on thryoid. No other concerns.      Here for f/u on thyroid.  Last visit late August she came in for well visit.  At that time labs showed abnormal thyroid levels . We initiated medication which she is taking.  No problems with medication so far.  Here for lab.  Last visit we discussed vaccines and recommendations.  She declines any other vaccines today  No other aggravating or relieving factors.    No other c/o.  The following portions of the patient's history were reviewed and updated as appropriate: allergies, current medications, past family history, past medical history, past social history, past surgical history and problem list.  ROS Otherwise as in subjective above  Objective: BP 110/68   Pulse 92   Wt 146 lb 12.8 oz (66.6 kg)   BMI 26.00 kg/m   Wt Readings from Last 3 Encounters:  08/06/22 146 lb 12.8 oz (66.6 kg)  06/10/22 143 lb (64.9 kg)  06/06/22 146 lb (66.2 kg)    General appearance: alert, no distress, well developed, well nourished Psych: pleasant, good eye contact, answers questions appropriately   Assessment: Encounter Diagnoses  Name Primary?   Hypothyroidism, unspecified type Yes   Vaccine counseling      Plan: Hypothyroidism - compliant with medication initiated 05/2022.  Updated labs today  Immunizations: I recommend Shingles vaccine at the pharmacy I recommend an updated pneumococcal vaccine, Prevnar 20    Darlene Ballard was seen today for follow-up on thyroid.  Diagnoses and all orders for this visit:  Hypothyroidism, unspecified type -     TSH + free T4  Vaccine counseling    Follow up: pending labs

## 2022-08-07 ENCOUNTER — Other Ambulatory Visit: Payer: Self-pay | Admitting: Medical

## 2022-08-07 DIAGNOSIS — E039 Hypothyroidism, unspecified: Secondary | ICD-10-CM

## 2022-08-07 LAB — TSH+FREE T4
Free T4: 0.87 ng/dL (ref 0.82–1.77)
TSH: 5.08 u[IU]/mL — ABNORMAL HIGH (ref 0.450–4.500)

## 2022-08-07 MED ORDER — LEVOTHYROXINE SODIUM 88 MCG PO TABS: 88.0000 ug | ORAL_TABLET | Freq: Every day | ORAL | 2 refills | Status: DC

## 2022-08-07 NOTE — Progress Notes (Signed)
Called pt advised labs, and med change.  She wanted copy of labs mailed.  She will call back to schedule nurse visit.

## 2022-08-26 ENCOUNTER — Encounter: Payer: Self-pay | Admitting: Physician Assistant

## 2022-08-26 ENCOUNTER — Ambulatory Visit: Payer: PPO | Admitting: Physician Assistant

## 2022-08-26 VITALS — Ht 66.0 in

## 2022-08-26 DIAGNOSIS — G301 Alzheimer's disease with late onset: Secondary | ICD-10-CM

## 2022-08-26 DIAGNOSIS — F02818 Dementia in other diseases classified elsewhere, unspecified severity, with other behavioral disturbance: Secondary | ICD-10-CM
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
## 2022-08-26 NOTE — Patient Instructions (Signed)
It was a pleasure to see you today at our office.   Recommendations:  Follow up in  6 months Continue donepezil 10 mg daily. Side effects were discussed  Continue Memantine 10 mg twice daily.Side effects were discussed  Consider memory care   Whom to call:  Memory  decline, memory medications: Call our office [phone]   For psychiatric meds, mood meds: Please have your primary care physician manage these medications.   Counseling regarding caregiver distress, including caregiver depression, anxiety and issues regarding community resources, adult day care programs, adult living facilities, or memory care questions:   Feel free to contact [place], Social Worker at [phone]   For assessment of decision of mental capacity and competency:  Call Dr. Anthoney Harada, geriatric psychiatrist at [phone]  For guidance in geriatric dementia issues please call [facility] [phone]  For guidance regarding [facility] and if placement were needed at the facility, contact Arnell Asal, Social Worker tel: [phone]  If you have any severe symptoms of a stroke, or other severe issues such as confusion,severe chills or fever, etc call 911 or go to the ER as you may need to be evaluated further   Feel free to visit Facebook page " [email or web address]" for tips of how to care for people with memory problems.      RECOMMENDATIONS FOR ALL PATIENTS WITH MEMORY PROBLEMS: 1. Continue to exercise (Recommend 30 minutes of walking everyday, or 3 hours every week) 2. Increase social interactions - continue going to [place] and enjoy social gatherings with friends and family 3. Eat healthy, avoid fried foods and eat more fruits and vegetables 4. Maintain adequate blood pressure, blood sugar, and blood cholesterol level. Reducing the risk of stroke and cardiovascular disease also helps promoting better memory. 5. Avoid stressful situations. Live a simple life  and avoid aggravations. Organize your time and prepare for the next day in anticipation. 6. Sleep well, avoid any interruptions of sleep and avoid any distractions in the bedroom that may interfere with adequate sleep quality 7. Avoid sugar, avoid sweets as there is a strong link between excessive sugar intake, diabetes, and cognitive impairment We discussed the Mediterranean diet, which has been shown to help patients reduce the risk of progressive memory disorders and reduces cardiovascular risk. This includes eating fish, eat fruits and green leafy vegetables, nuts like almonds and hazelnuts, walnuts, and also use olive oil. Avoid fast foods and fried foods as much as possible. Avoid sweets and sugar as sugar use has been linked to worsening of memory function.  There is always a concern of gradual progression of memory problems. If this is the case, then we may need to adjust level of care according to patient needs. Support, both to the patient and caregiver, should then be put into place.    The Alzheimer's Association is here all day, every day for people facing Alzheimer's disease through our free 24/7 Helpline: [phone]. The Helpline provides reliable information and support to all those who need assistance, such as individuals living with memory loss, Alzheimer's or other dementia, caregivers, health care professionals and the public.  Our highly trained and knowledgeable staff can help you with: Understanding memory loss, dementia and Alzheimer's  Medications and other treatment options  General information about aging and brain health  Skills to provide quality care and to find the best care from professionals  Legal, financial and living-arrangement decisions Our Helpline also features: Confidential care consultation provided by master's level  clinicians who can help with decision-making support, crisis assistance and education on issues families face every day  Help in a caller's  preferred language using our translation service that features more than 200 languages and dialects  Referrals to local community programs, services and ongoing support     FALL PRECAUTIONS: Be cautious when walking. Scan the area for obstacles that may increase the risk of trips and falls. When getting up in the mornings, sit up at the edge of the bed for a few minutes before getting out of bed. Consider elevating the bed at the head end to avoid drop of blood pressure when getting up. Walk always in a well-lit room (use night lights in the walls). Avoid area rugs or power cords from appliances in the middle of the walkways. Use a walker or a cane if necessary and consider physical therapy for balance exercise. Get your eyesight checked regularly.  FINANCIAL OVERSIGHT: Supervision, especially oversight when making financial decisions or transactions is also recommended.  HOME SAFETY: Consider the safety of the kitchen when operating appliances like stoves, microwave oven, and blender. Consider having supervision and share cooking responsibilities until no longer able to participate in those. Accidents with firearms and other hazards in the house should be identified and addressed as well.   ABILITY TO BE LEFT ALONE: If patient is unable to contact 911 operator, consider using LifeLine, or when the need is there, arrange for someone to stay with patients. Smoking is a fire hazard, consider supervision or cessation. Risk of wandering should be assessed by caregiver and if detected at any point, supervision and safe proof recommendations should be instituted.  MEDICATION SUPERVISION: Inability to self-administer medication needs to be constantly addressed. Implement a mechanism to ensure safe administration of the medications.   DRIVING: Regarding driving, in patients with progressive memory problems, driving will be impaired. We advise to have someone else do the driving if trouble finding  directions or if minor accidents are reported. Independent driving assessment is available to determine safety of driving.   If you are interested in the driving assessment, you can contact the following:  [employer] in [place]  [place] [phone] or [phone]      [place] refers to food and lifestyle choices that are based on the traditions of countries located on the [employer]. This way of eating has been shown to help prevent certain conditions and improve outcomes for people who have chronic diseases, like kidney disease and heart disease. What are tips for following this plan? Lifestyle  Cook and eat meals together with your family, when possible. Drink enough fluid to keep your urine clear or pale yellow. Be physically active every day. This includes: Aerobic exercise like running or swimming. Leisure activities like gardening, walking, or housework. Get 7-8 hours of sleep each night. If recommended by your health care provider, drink red wine in moderation. This means 1 glass a day for nonpregnant women and 2 glasses a day for men. A glass of wine equals 5 oz (150 mL). Reading food labels  Check the serving size of packaged foods. For foods such as rice and pasta, the serving size refers to the amount of cooked product, not dry. Check the total fat in packaged foods. Avoid foods that have saturated fat or trans fats. Check the ingredients list for added sugars, such as corn syrup. Shopping  At the  grocery store, buy most of your food from the areas near the walls of the store. This includes: Fresh fruits and vegetables (produce). Grains, beans, nuts, and seeds. Some of these may be available in unpackaged forms or large amounts (in bulk). Fresh seafood. Poultry and eggs. Low-fat dairy products. Buy whole  ingredients instead of prepackaged foods. Buy fresh fruits and vegetables in-season from local farmers markets. Buy frozen fruits and vegetables in resealable bags. If you do not have access to quality fresh seafood, buy precooked frozen shrimp or canned fish, such as tuna, salmon, or sardines. Buy small amounts of raw or cooked vegetables, salads, or olives from the deli or salad bar at your store. Stock your pantry so you always have certain foods on hand, such as olive oil, canned tuna, canned tomatoes, rice, pasta, and beans. Cooking  Cook foods with extra-virgin olive oil instead of using butter or other vegetable oils. Have meat as a side dish, and have vegetables or grains as your main dish. This means having meat in small portions or adding small amounts of meat to foods like pasta or stew. Use beans or vegetables instead of meat in common dishes like chili or lasagna. Experiment with different cooking methods. Try roasting or broiling vegetables instead of steaming or sauteing them. Add frozen vegetables to soups, stews, pasta, or rice. Add nuts or seeds for added healthy fat at each meal. You can add these to yogurt, salads, or vegetable dishes. Marinate fish or vegetables using olive oil, lemon juice, garlic, and fresh herbs. Meal planning  Plan to eat 1 vegetarian meal one day each week. Try to work up to 2 vegetarian meals, if possible. Eat seafood 2 or more times a week. Have healthy snacks readily available, such as: Vegetable sticks with hummus. Greek yogurt. Fruit and nut trail mix. Eat balanced meals throughout the week. This includes: Fruit: 2-3 servings a day Vegetables: 4-5 servings a day Low-fat dairy: 2 servings a day Fish, poultry, or lean meat: 1 serving a day Beans and legumes: 2 or more servings a week Nuts and seeds: 1-2 servings a day Whole grains: 6-8 servings a day Extra-virgin olive oil: 3-4 servings a day Limit red meat and sweets to only a few servings  a month What are my food choices? Mediterranean diet Recommended Grains: Whole-grain pasta. Brown rice. Bulgar wheat. Polenta. Couscous. Whole-wheat bread. [place]. Vegetables: Artichokes. Beets. Broccoli. Cabbage. Carrots. Eggplant. Green beans. Chard. Kale. Spinach. Onions. Leeks. Peas. Squash. Tomatoes. Peppers. Radishes. Fruits: Apples. Apricots. Avocado. Berries. Bananas. Cherries. Dates. Figs. Grapes. Lemons. Melon. Oranges. Peaches. Plums. Pomegranate. Meats and other protein foods: Beans. Almonds. Sunflower seeds. Pine nuts. Peanuts. [place]. Salmon. Scallops. Shrimp. [place]. Tilapia. Clams. Oysters. Eggs. Dairy: Low-fat milk. Cheese. Greek yogurt. Beverages: Water. Red wine. Herbal tea. Fats and oils: Extra virgin olive oil. Avocado oil. Grape seed oil. Sweets and desserts: [place] yogurt with honey. Baked apples. Poached pears. Trail mix. Seasoning and other foods: Basil. Cilantro. Coriander. Cumin. Mint. Parsley. Sage. Rosemary. Tarragon. Garlic. Oregano. Thyme. Pepper. Balsalmic vinegar. Tahini. Hummus. Tomato sauce. Olives. Mushrooms. Limit these Grains: Prepackaged pasta or rice dishes. Prepackaged cereal with added sugar. Vegetables: Deep fried potatoes (french fries). Fruits: Fruit canned in syrup. Meats and other protein foods: Beef. Pork. Lamb. Poultry with skin. Hot dogs. [place] Salines. Dairy: Ice cream. Sour cream. Whole milk. Beverages: Juice. Sugar-sweetened soft drinks. Beer. Liquor and spirits. Fats and oils: Butter. Canola oil. Vegetable oil. Beef fat (tallow). Lard. Sweets and desserts: Cookies. Cakes. Pies.  Candy. Seasoning and other foods: Mayonnaise. Premade sauces and marinades. The items listed may not be a complete list. Talk with your dietitian about what dietary choices are right for you. Summary The Mediterranean diet includes both food and lifestyle choices. Eat a variety of fresh fruits and vegetables, beans, nuts, seeds, and whole grains. Limit the amount of  red meat and sweets that you eat. Talk with your health care provider about whether it is safe for you to drink red wine in moderation. This means 1 glass a day for nonpregnant women and 2 glasses a day for men. A glass of wine equals 5 oz (150 mL). This information is not intended to replace advice given to you by your health care provider. Make sure you discuss any questions you have with your health care provider. Document Released: 05/22/2016 Document Revised: 06/24/2016 Document Reviewed: 05/22/2016 Elsevier Interactive Patient Education  2017 [employer].

## 2022-09-10 ENCOUNTER — Other Ambulatory Visit: Payer: PPO

## 2022-09-10 DIAGNOSIS — E039 Hypothyroidism, unspecified: Secondary | ICD-10-CM

## 2022-09-11 LAB — TSH+FREE T4
Free T4: 1.47 ng/dL (ref 0.82–1.77)
TSH: 5.41 u[IU]/mL — ABNORMAL HIGH (ref 0.450–4.500)

## 2022-09-26 ENCOUNTER — Telehealth: Payer: Self-pay | Admitting: Anesthesiology

## 2022-09-26 NOTE — Telephone Encounter (Signed)
Pt's husband called stating Clarise Cruz had given him information of an attorney the last visit pt was here. Pt has misplaced attorney information, he would like to get it again. He asked to be called on the number selected he states if phone goes straight to vm the first time hang up and call again. "His cell acts up at times"

## 2022-09-29 NOTE — Telephone Encounter (Signed)
Mail box is full at 09/29/2022 at 9:01am

## 2022-09-29 NOTE — Telephone Encounter (Signed)
Spoke and number was given.

## 2022-11-18 ENCOUNTER — Ambulatory Visit: Payer: PPO | Admitting: Physician Assistant

## 2022-12-10 ENCOUNTER — Other Ambulatory Visit: Payer: Self-pay | Admitting: Medical

## 2023-02-17 ENCOUNTER — Other Ambulatory Visit: Payer: Self-pay | Admitting: Physician Assistant

## 2023-02-23 ENCOUNTER — Other Ambulatory Visit: Payer: Self-pay | Admitting: Medical

## 2023-02-23 MED ORDER — LEVOTHYROXINE SODIUM 88 MCG PO TABS: 88.0000 ug | ORAL_TABLET | Freq: Every day | ORAL | 1 refills | Status: DC

## 2023-03-10 ENCOUNTER — Encounter: Payer: Self-pay | Admitting: Physician Assistant

## 2023-03-10 ENCOUNTER — Ambulatory Visit: Payer: PPO | Admitting: Physician Assistant

## 2023-03-10 VITALS — BP 138/70 | HR 60 | Resp 18 | Ht 66.0 in | Wt 138.0 lb

## 2023-03-10 DIAGNOSIS — F02818 Dementia in other diseases classified elsewhere, unspecified severity, with other behavioral disturbance: Secondary | ICD-10-CM | POA: Diagnosis not present

## 2023-03-10 DIAGNOSIS — G301 Alzheimer's disease with late onset: Secondary | ICD-10-CM

## 2023-03-10 MED ORDER — MEMANTINE HCL 10 MG PO TABS: 10.0000 mg | ORAL_TABLET | Freq: Two times a day (BID) | ORAL | 11 refills | Status: DC

## 2023-03-10 MED ORDER — DONEPEZIL HCL 10 MG PO TABS: ORAL_TABLET | ORAL | 11 refills | Status: DC

## 2023-03-10 NOTE — Patient Instructions (Signed)
It was a pleasure to see you today at our office.   Recommendations:  Follow up in  6 months Continue donepezil 10 mg daily. Side effects were discussed  Continue Memantine 10 mg twice daily.Side effects were discussed  Needs 24/7 care   Whom to call:  Memory  decline, memory medications: Call our office [phone]   For psychiatric meds, mood meds: Please have your primary care physician manage these medications.   Counseling regarding caregiver distress, including caregiver depression, anxiety and issues regarding community resources, adult day care programs, adult living facilities, or memory care questions:   Feel free to contact Misty Lisabeth Register, Social Worker at [phone]   For assessment of decision of mental capacity and competency:  Call Dr. Erick Blinks, geriatric psychiatrist at [phone]  For guidance in geriatric dementia issues please call [facility] [phone]  For guidance regarding [facility] and if placement were needed at the facility, contact Sidney Ace, Social Worker tel: [phone]  If you have any severe symptoms of a stroke, or other severe issues such as confusion,severe chills or fever, etc call 911 or go to the ER as you may need to be evaluated further   Feel free to visit Facebook page " [email or web address]" for tips of how to care for people with memory problems.      RECOMMENDATIONS FOR ALL PATIENTS WITH MEMORY PROBLEMS: 1. Continue to exercise (Recommend 30 minutes of walking everyday, or 3 hours every week) 2. Increase social interactions - continue going to [place] and enjoy social gatherings with friends and family 3. Eat healthy, avoid fried foods and eat more fruits and vegetables 4. Maintain adequate blood pressure, blood sugar, and blood cholesterol level. Reducing the risk of stroke and cardiovascular disease also helps promoting better memory. 5. Avoid stressful situations. Live a simple life and  avoid aggravations. Organize your time and prepare for the next day in anticipation. 6. Sleep well, avoid any interruptions of sleep and avoid any distractions in the bedroom that may interfere with adequate sleep quality 7. Avoid sugar, avoid sweets as there is a strong link between excessive sugar intake, diabetes, and cognitive impairment We discussed the Mediterranean diet, which has been shown to help patients reduce the risk of progressive memory disorders and reduces cardiovascular risk. This includes eating fish, eat fruits and green leafy vegetables, nuts like almonds and hazelnuts, walnuts, and also use olive oil. Avoid fast foods and fried foods as much as possible. Avoid sweets and sugar as sugar use has been linked to worsening of memory function.  There is always a concern of gradual progression of memory problems. If this is the case, then we may need to adjust level of care according to patient needs. Support, both to the patient and caregiver, should then be put into place.    The Alzheimer's Association is here all day, every day for people facing Alzheimer's disease through our free 24/7 Helpline: 7063665827. The Helpline provides reliable information and support to all those who need assistance, such as individuals living with memory loss, Alzheimer's or other dementia, caregivers, health care professionals and the public.  Our highly trained and knowledgeable staff can help you with: Understanding memory loss, dementia and Alzheimer's  Medications and other treatment options  General information about aging and brain health  Skills to provide quality care and to find the best care from professionals  Legal, financial and living-arrangement decisions Our Helpline also features: Confidential care consultation provided by master's level  clinicians who can help with decision-making support, crisis assistance and education on issues families face every day  Help in a caller's  preferred language using our translation service that features more than 200 languages and dialects  Referrals to local community programs, services and ongoing support     FALL PRECAUTIONS: Be cautious when walking. Scan the area for obstacles that may increase the risk of trips and falls. When getting up in the mornings, sit up at the edge of the bed for a few minutes before getting out of bed. Consider elevating the bed at the head end to avoid drop of blood pressure when getting up. Walk always in a well-lit room (use night lights in the walls). Avoid area rugs or power cords from appliances in the middle of the walkways. Use a walker or a cane if necessary and consider physical therapy for balance exercise. Get your eyesight checked regularly.  FINANCIAL OVERSIGHT: Supervision, especially oversight when making financial decisions or transactions is also recommended.  HOME SAFETY: Consider the safety of the kitchen when operating appliances like stoves, microwave oven, and blender. Consider having supervision and share cooking responsibilities until no longer able to participate in those. Accidents with firearms and other hazards in the house should be identified and addressed as well.   ABILITY TO BE LEFT ALONE: If patient is unable to contact 911 operator, consider using LifeLine, or when the need is there, arrange for someone to stay with patients. Smoking is a fire hazard, consider supervision or cessation. Risk of wandering should be assessed by caregiver and if detected at any point, supervision and safe proof recommendations should be instituted.  MEDICATION SUPERVISION: Inability to self-administer medication needs to be constantly addressed. Implement a mechanism to ensure safe administration of the medications.   DRIVING: Regarding driving, in patients with progressive memory problems, driving will be impaired. We advise to have someone else do the driving if trouble finding  directions or if minor accidents are reported. Independent driving assessment is available to determine safety of driving.   If you are interested in the driving assessment, you can contact the following:  [employer] in [place] [phone]  [facility] [phone]  [facility] [phone] or [phone]      Mediterranean Diet A Mediterranean diet refers to food and lifestyle choices that are based on the traditions of countries located on the [employer]. This way of eating has been shown to help prevent certain conditions and improve outcomes for people who have chronic diseases, like kidney disease and heart disease. What are tips for following this plan? Lifestyle  Cook and eat meals together with your family, when possible. Drink enough fluid to keep your urine clear or pale yellow. Be physically active every day. This includes: Aerobic exercise like running or swimming. Leisure activities like gardening, walking, or housework. Get 7-8 hours of sleep each night. If recommended by your health care provider, drink red wine in moderation. This means 1 glass a day for nonpregnant women and 2 glasses a day for men. A glass of wine equals 5 oz (150 mL). Reading food labels  Check the serving size of packaged foods. For foods such as rice and pasta, the serving size refers to the amount of cooked product, not dry. Check the total fat in packaged foods. Avoid foods that have saturated fat or trans fats. Check the ingredients list for added sugars, such as corn syrup. Shopping  At the  grocery store, buy most of your food from the areas near the walls of the store. This includes: Fresh fruits and vegetables (produce). Grains, beans, nuts, and seeds. Some of these may be available in unpackaged forms or large amounts (in bulk). Fresh seafood. Poultry and eggs. Low-fat dairy products. Buy whole  ingredients instead of prepackaged foods. Buy fresh fruits and vegetables in-season from local farmers markets. Buy frozen fruits and vegetables in resealable bags. If you do not have access to quality fresh seafood, buy precooked frozen shrimp or canned fish, such as tuna, salmon, or sardines. Buy small amounts of raw or cooked vegetables, salads, or olives from the deli or salad bar at your store. Stock your pantry so you always have certain foods on hand, such as olive oil, canned tuna, canned tomatoes, rice, pasta, and beans. Cooking  Cook foods with extra-virgin olive oil instead of using butter or other vegetable oils. Have meat as a side dish, and have vegetables or grains as your main dish. This means having meat in small portions or adding small amounts of meat to foods like pasta or stew. Use beans or vegetables instead of meat in common dishes like chili or lasagna. Experiment with different cooking methods. Try roasting or broiling vegetables instead of steaming or sauteing them. Add frozen vegetables to soups, stews, pasta, or rice. Add nuts or seeds for added healthy fat at each meal. You can add these to yogurt, salads, or vegetable dishes. Marinate fish or vegetables using olive oil, lemon juice, garlic, and fresh herbs. Meal planning  Plan to eat 1 vegetarian meal one day each week. Try to work up to 2 vegetarian meals, if possible. Eat seafood 2 or more times a week. Have healthy snacks readily available, such as: Vegetable sticks with hummus. Greek yogurt. Fruit and nut trail mix. Eat balanced meals throughout the week. This includes: Fruit: 2-3 servings a day Vegetables: 4-5 servings a day Low-fat dairy: 2 servings a day Fish, poultry, or lean meat: 1 serving a day Beans and legumes: 2 or more servings a week Nuts and seeds: 1-2 servings a day Whole grains: 6-8 servings a day Extra-virgin olive oil: 3-4 servings a day Limit red meat and sweets to only a few servings  a month What are my food choices? Mediterranean diet Recommended Grains: Whole-grain pasta. Brown rice. Bulgar wheat. Polenta. Couscous. Whole-wheat bread. Orpah Cobb. Vegetables: Artichokes. Beets. Broccoli. Cabbage. Carrots. Eggplant. Green beans. Chard. Kale. Spinach. Onions. Leeks. Peas. Squash. Tomatoes. Peppers. Radishes. Fruits: Apples. Apricots. Avocado. Berries. Bananas. Cherries. Dates. Figs. Grapes. Lemons. Melon. Oranges. Peaches. Plums. Pomegranate. Meats and other protein foods: Beans. Almonds. Sunflower seeds. Pine nuts. Peanuts. Cod. Salmon. Scallops. Shrimp. Tuna. Tilapia. Clams. Oysters. Eggs. Dairy: Low-fat milk. Cheese. Greek yogurt. Beverages: Water. Red wine. Herbal tea. Fats and oils: Extra virgin olive oil. Avocado oil. Grape seed oil. Sweets and desserts: [place] yogurt with honey. Baked apples. Poached pears. Trail mix. Seasoning and other foods: Basil. Cilantro. Coriander. Cumin. Mint. Parsley. Sage. Rosemary. Tarragon. Garlic. Oregano. Thyme. Pepper. Balsalmic vinegar. Tahini. Hummus. Tomato sauce. Olives. Mushrooms. Limit these Grains: Prepackaged pasta or rice dishes. Prepackaged cereal with added sugar. Vegetables: Deep fried potatoes (french fries). Fruits: Fruit canned in syrup. Meats and other protein foods: Beef. Pork. Lamb. Poultry with skin. Hot dogs. Tomasa Blase. Dairy: Ice cream. Sour cream. Whole milk. Beverages: Juice. Sugar-sweetened soft drinks. Beer. Liquor and spirits. Fats and oils: Butter. Canola oil. Vegetable oil. Beef fat (tallow). Lard. Sweets and desserts: Cookies. Cakes. Pies.  Candy. Seasoning and other foods: Mayonnaise. Premade sauces and marinades. The items listed may not be a complete list. Talk with your dietitian about what dietary choices are right for you. Summary The Mediterranean diet includes both food and lifestyle choices. Eat a variety of fresh fruits and vegetables, beans, nuts, seeds, and whole grains. Limit the amount of  red meat and sweets that you eat. Talk with your health care provider about whether it is safe for you to drink red wine in moderation. This means 1 glass a day for nonpregnant women and 2 glasses a day for men. A glass of wine equals 5 oz (150 mL). This information is not intended to replace advice given to you by your health care provider. Make sure you discuss any questions you have with your health care provider. Document Released: 05/22/2016 Document Revised: 06/24/2016 Document Reviewed: 05/22/2016 Elsevier Interactive Patient Education  2017 [employer].

## 2023-03-10 NOTE — Progress Notes (Signed)
Assessment/Plan:   Dementia likely due to Alzheimer's Disease, late onset with behavioral disturbance.   Darlene Ballard is a very pleasant 77 y.o. RH female  with a history of hypertension, hyperlipidemia, hypothyroidism, anxiety and a history of dementia due to Alzheimer's disease with behavioral disturbance seen today in follow up for memory loss. Patient is currently on donepezil 10 mg daily and memantine 10 mg twice a day. Progression of the disease is noted. She requires 24/7 monitoring.  Her husband is entertaining home health nursing as she is increasingly requiring more care needs, however he has not interested in memory care or ALF "I want to keep her at home ".    Follow up in 6 months. Recommend discontinuing memantine and donepezil given the progression of the disease, however husband would like to keep her on the medications for now. Continue 24/7 supervision.  Agree with HHN Recommend good control of her cardiovascular risk factors Continue to control mood as per PCP    Subjective:    This patient is accompanied in the office by her husband who supplements the history.  Previous records as well as any outside records available were reviewed prior to todays visit. Patient was last seen on 08/26/22    Any changes in memory since last visit?  Long-term and short-term memory are both impaired.  Patient depends on her husband for all ADLs.  She watches TV and takes care of her dogs but she is no longer doing any brain stimulation exercises.  Patient has no insight into her condition. Repeats oneself?  Endorsed Disoriented when walking into a room?  Patient denies, but she is hardly leaves her home.  When they go out, she is always accompanied by  her husband, he did not report any episodes of her wandering off. Leaving objects in unusual places?  She misplaces things, such as the remote control and clothing, hiding them and later unable to find them.  "The other day I found a bag   of remotes" Wandering behavior?  denies   Any personality changes since last visit?  "She lashes at me all the time but I am used to it " Any worsening depression?:  denies   Hallucinations or paranoia?  "She sees and talks to people that are not there ""2 women and a young boy who hide al the stuff "-husband reports that these are not scary to her, she laughs.  Seizures?  denies    Any sleep changes? Sleeps well.  Denies vivid dreams, REM behavior or sleepwalking   Sleep apnea?   denies   Any hygiene concerns?  Endorsed.  "She does not wash her feet after taking care of her dogs "(8 of them) . "She does not want to shower, sometimes I have to force to shower".  Independent of bathing and dressing?  Endorsed  Does the patient needs help with medications?  Husband is in charge   Who is in charge of the finances?  Husband is in charge     Any changes in appetite? "She eats all the time" "She eats sweets, packed with sugar" "she eats very little nutritious foods " Patient have trouble swallowing?  denies   Does the patient cook?  No any kitchen accidents such as leaving the stove on? Patient denies   Any headaches?   denies   Chronic back pain  denies   Ambulates with difficulty?     denies   Recent falls or head injuries? denies  Unilateral weakness, numbness or tingling?    denies   Any tremors?  denies   Any anosmia?  Patient denies   Any incontinence of urine?  denies   Any bowel dysfunction?     denies      Patient lives with her husband  Does the patient drive? No longer drives.   Initial visit 12/11/20 This is a 77 year old right-handed woman with a history of hypertension, hypothyroidism, presenting for evaluation of dementia. She was previously seen at [facility] but apparently had a falling out with her daughter who had brought her for memory evaluation, now asking "to start fresh" where her daughter has not taken her in the past. She is accompanied by her husband, who  reports they had a complicated relationship and had been living separate lives in the same house since 2004, until they both had a falling out with their daughter and he took over with her care 2 years ago when pandemic started. When asked about her memory, she states she forgets things sometimes. Her husband reports starting to notice memory changes 6 months ago. She was evaluated at [facility] in 07/2019 where daughter reported memory changes since 2019, worsened in 2020. She repeated herself, got lost driving sometimes. She is noted to have a history of hypothyroidism but stopped thyroid medication and Xanax abruptly around 2017 when she was helping her mother dealing with dementia. She retired as a Manufacturing systems engineer at age 17. She was last seen at [facility] in June 2021, at that time, it was noted she stopped all medications (side effects on Donepezil) the month prior, which coincided with the time her daughter suggested she no longer drive. Her husband reports she drives minimally, she got lost driving 4 months ago. Around May/June 2021, she withdrew money from her checking account to buy her own car, her daughter found out, returned the money, then she mended her relationship with her husband and they went and bought a car together. She was behind her house payment since 2020 and her daughter was trying to help catch this up. Her husband states that their daughter was taking money from both of them, he has been managing finances for at least 6 months now. He reports that she misplaces things and forgets things, they have put buzzers on her pocketbook and keys because she loses them 5 times a day. Her husband manages meals. He denies any hygiene concerns, she is independent with dressing and bathing, they have 9 dogs which she takes care of without issues. There is a family history of dementia in her maternal grandparents, mother, maternal aunts. She denies any significant head injuries, no alcohol use.    She denies any  headaches, dizziness, diplopia, dysarthria, dysphagia, neck/back pain, focal numbness/tingling/weakness, bowel/bladder dysfunction. No anosmia, tremors, no falls. She reports her mood is okay, she denies any increased irritability, her husband notes she gets mad at herself when she cannot find something. No hallucinations. Sleep is good, no wandering behaviors.    I personally reviewed MRI brain without contrast done 08/2019 which did not show any acute changes, there was mild diffuse atrophy and mild chronic microvascular disease.   PREVIOUS MEDICATIONS:   CURRENT MEDICATIONS:  Outpatient Encounter Medications as of 03/10/2023  Medication Sig   levothyroxine (SYNTHROID) 88 MCG tablet Take 1 tablet (88 mcg total) by mouth daily.   [DISCONTINUED] donepezil (ARICEPT) 10 MG tablet TAKE 1/2 TABLET (5 MG) BY MOUTH DAILY FOR 2 WEEKS, THEN INCREASE TO 1 TABLET (  10 MG) DAILY.   [DISCONTINUED] memantine (NAMENDA) 10 MG tablet TAKE 1 TABLET BY MOUTH 2 TIMES DAILY.   donepezil (ARICEPT) 10 MG tablet TAKE   1 TABLET (10 MG) DAILY.   memantine (NAMENDA) 10 MG tablet Take 1 tablet (10 mg total) by mouth 2 (two) times daily.   No facility-administered encounter medications on file as of 03/10/2023.       03/21/2020    3:00 PM 01/03/2020    7:19 AM 08/09/2019    3:15 PM  MMSE - Mini Mental State Exam  Orientation to time 2 0 3  Orientation to Place 3 3 4   Registration 3 3 3   Attention/ Calculation 1 1 1   Recall 0 0 0  Language- name 2 objects 2 2 2   Language- repeat 1 1 1   Language- follow 3 step command 3 3 3   Language- read & follow direction 1 1 1   Write a sentence 1 1 1   Copy design 1 1 1   Total score 18 16 20       12/11/2020    8:52 AM  Montreal Cognitive Assessment   Visuospatial/ Executive (0/5) 0  Naming (0/3) 1  Attention: Read list of digits (0/2) 1  Attention: Read list of letters (0/1) 0  Attention: Serial 7 subtraction starting at 100 (0/3) 1  Language: Repeat phrase (0/2) 0   Language : Fluency (0/1) 0  Abstraction (0/2) 1  Delayed Recall (0/5) 0  Orientation (0/6) 0  Total 4  Adjusted Score (based on education) 4    Objective:     PHYSICAL EXAMINATION:    VITALS:   Vitals:   03/10/23 1115  BP: 138/70  Pulse: 60  Resp: 18  SpO2: 99%  Weight: 138 lb (62.6 kg)  Height: 5\' 6"  (1.676 m)    GEN:  The patient appears stated age and is in NAD. HEENT:  Normocephalic, atraumatic.   Neurological examination:  General: NAD, somewhat disheveled appearance, appears stated age. Orientation: The patient is alert. Oriented to person, not to place and date  Cranial nerves: There is good facial symmetry.The speech is fluent and clear. No aphasia or dysarthria. Fund of knowledge is reduced.  Recent and remote memory are impaired. Attention and concentration are reduced.  Unable to name objects and repeat phrases.  Hearing is intact to conversational tone.  Sensation: Sensation is intact to light touch throughout Motor: Strength is at least antigravity x4. DTR's 2/4 in UE/LE     Movement examination: Tone: There is normal tone in the UE/LE Abnormal movements:  no tremor.  No myoclonus.  No asterixis.   Coordination:  There is no decremation with RAM's. Normal finger to nose  Gait and Station: The patient has no difficulty arising out of a deep-seated chair without the use of the hands. The patient's stride length is good.  Gait is cautious and narrow.    Thank you for allowing [place] the opportunity to participate in the care of this nice patient. Please do not hesitate to contact us for any questions or concerns.   Total time spent on today's visit was 23 minutes dedicated to this patient today, preparing to see patient, examining the patient, ordering tests and/or medications and counseling the patient, documenting clinical information in the EHR or other health record, independently interpreting results and communicating results to the patient/family,  discussing treatment and goals, answering patient's questions and coordinating care.  Cc:  Genia Del  Marlowe Kays 03/10/2023 12:20 PM

## 2023-04-08 ENCOUNTER — Telehealth: Payer: Self-pay | Admitting: Physician Assistant

## 2023-04-08 NOTE — Telephone Encounter (Signed)
Pt husband called and states that patient is using the restroom on herself every other hour. He would like to speak to someone about this as he states this is gone to far.

## 2023-04-09 NOTE — Telephone Encounter (Signed)
No answer at 8:16am 04/09/2023 if patient call back, needs to see PCP per Jerelyn Charles.

## 2023-04-09 NOTE — Telephone Encounter (Signed)
I was able to caregiver, to contact PCP for an appt. He thanked me for calling.

## 2023-04-10 ENCOUNTER — Encounter: Payer: Self-pay | Admitting: Medical

## 2023-04-10 ENCOUNTER — Ambulatory Visit (INDEPENDENT_AMBULATORY_CARE_PROVIDER_SITE_OTHER): Payer: PPO | Admitting: Medical

## 2023-04-10 VITALS — BP 140/88 | HR 71 | Ht 66.0 in | Wt 142.0 lb

## 2023-04-10 DIAGNOSIS — R350 Frequency of micturition: Secondary | ICD-10-CM

## 2023-04-10 DIAGNOSIS — R300 Dysuria: Secondary | ICD-10-CM | POA: Diagnosis not present

## 2023-04-10 LAB — POCT URINALYSIS DIP (PROADVANTAGE DEVICE)
Blood, UA: NEGATIVE
Glucose, UA: NEGATIVE mg/dL
Ketones, POC UA: NEGATIVE mg/dL
Nitrite, UA: NEGATIVE
Protein Ur, POC: NEGATIVE mg/dL
Specific Gravity, Urine: 1.02
Urobilinogen, Ur: 0.2
pH, UA: 6 (ref 5.0–8.0)

## 2023-04-10 LAB — GLUCOSE, POCT (MANUAL RESULT ENTRY): POC Glucose: 89 mg/dl (ref 70–99)

## 2023-04-10 NOTE — Progress Notes (Signed)
Subjective:  Darlene Ballard is a 77 y.o. female who presents for Chief Complaint  Patient presents with   Acute Visit    She having a lot urine freq, burning when going urination , having pain in lower right abd      Here with husband for urinary concerns.  Having some urinary frequency, burning some with urination, some pain in lower abdomen.  Husband feels as though some of her medications for memory are contributing to the urinary issues.  Husband feels this has been going on for months or maybe a year, and may have gotten worse of late.  Been on the medications for memory for about a year.  No recent fever, no nausea, no vomiting.   No diarrhea.   No blood in urine.   Wears adult diapers for the past year.    No other aggravating or relieving factors.    No other c/o.  Past Medical History:  Diagnosis Date   Memory loss    Current Outpatient Medications on File Prior to Visit  Medication Sig Dispense Refill   donepezil (ARICEPT) 10 MG tablet TAKE   1 TABLET (10 MG) DAILY. 30 tablet 11   levothyroxine (SYNTHROID) 88 MCG tablet Take 1 tablet (88 mcg total) by mouth daily. 30 tablet 1   memantine (NAMENDA) 10 MG tablet Take 1 tablet (10 mg total) by mouth 2 (two) times daily. (Patient not taking: Reported on 04/10/2023) 60 tablet 11   No current facility-administered medications on file prior to visit.    The following portions of the patient's history were reviewed and updated as appropriate: allergies, current medications, past family history, past medical history, past social history, past surgical history and problem list.  ROS Otherwise as in subjective above    Objective: BP (!) 140/88   Pulse 71   Ht 5\' 6"  (1.676 m)   Wt 142 lb (64.4 kg)   SpO2 97%   BMI 22.92 kg/m   General appearance: alert, no distress, well developed, well nourished Abdomen: +bs, soft, non tender, non distended, no masses, no hepatomegaly, no splenomegaly    Assessment: Encounter Diagnoses   Name Primary?   Urinary frequency Yes   Dysuria      Plan: Symptoms are longstanding for possibly a year.  Has been strongly feels like this started after she started medicine for memory including Namenda and Aricept.  He thinks it is related to [place].  Urinalysis normal today.  Blood sugar 89 urine frequency  Will send urine for culture  I talked about possibly doing a short-term trial off Namenda to see if that helps since on the list of potential adverse effects of his urinary incontinence.  We discussed possibly referring to urology for consult as well   [name] was seen today for acute visit.  Diagnoses and all orders for this visit:  Urinary frequency -     Urine Culture -     POCT Urinalysis DIP (Proadvantage Device) -     Glucose (CBG)  Dysuria -     Urine Culture -     POCT Urinalysis DIP (Proadvantage Device)    Follow up: pending culture

## 2023-04-10 NOTE — Patient Instructions (Signed)
Urinary frequency and incontinence can come from several causes.  It does not appear to be related to diabetes as her blood sugar is okay.  We will send urine for culture to see if there is any infection.  This takes a few days to get back.  In the meantime you could consider weaning down by having her come off the Namenda such as once a day for a few days then hold off on the medicine for several days to see if the urine frequency calms down.  There is the possibility that the medication could be causing some problem.  If the urine culture comes back negative then it may be worth a consult with the urology specialist in general to rule out other causes of urinary incontinence.  Urinary Incontinence Urinary incontinence refers to a condition in which a person is unable to control where and when to pass urine. A person with this condition will urinate involuntarily. This means that the person urinates when he or she does not mean to. What are the causes? This condition may be caused by: Medicines. Infections. Constipation. Overactive bladder muscles. Weak bladder muscles. Weak pelvic floor muscles. These muscles provide support for the bladder, intestine, and, in women, the uterus. Enlarged prostate in men. The prostate is a gland near the bladder. When it gets too big, it can pinch the urethra. With the urethra blocked, the bladder can weaken and lose the ability to empty properly. Surgery. Emotional factors, such as anxiety, stress, or post-traumatic stress disorder (PTSD). Spinal cord injury, nerve injury, or other neurological conditions. Pelvic organ prolapse. This happens in women when organs move out of place and into the vagina. This movement can prevent the bladder and urethra from working properly. What increases the risk? The following factors may make you more likely to develop this condition: Age. The older you are, the higher the risk. Obesity. Being physically  inactive. Pregnancy and childbirth. Menopause. Diseases that affect the nerves or spinal cord. Long-term, or chronic, coughing. This can increase pressure on the bladder and pelvic floor muscles. What are the signs or symptoms? Symptoms may vary depending on the type of urinary incontinence you have. They include: A sudden urge to urinate, and passing urine involuntarily before you can get to a bathroom (urge incontinence). Suddenly passing urine when doing activities that force urine to pass, such as coughing, laughing, exercising, or sneezing (stress incontinence). Needing to urinate often but urinating only a small amount, or constantly dribbling urine (overflow incontinence). Urinating because you cannot get to the bathroom in time due to a physical disability, such as arthritis or injury, or due to a communication or thinking problem, such as Alzheimer's disease (functional incontinence). How is this diagnosed? This condition may be diagnosed based on: Your medical history. A physical exam. Tests, such as: Urine tests. X-rays of your kidney and bladder. Ultrasound. CT scan. Cystoscopy. In this procedure, a health care provider inserts a tube with a light and camera (cystoscope) through the urethra and into the bladder to check for problems. Urodynamic testing. These tests assess how well the bladder, urethra, and sphincter can store and release urine. There are different types of urodynamic tests, and they vary depending on what the test is measuring. To help diagnose your condition, your health care provider may recommend that you keep a log of when you urinate and how much you urinate. How is this treated? Treatment for this condition depends on the type of incontinence that you have and  its cause. Treatment may include: Lifestyle changes, such as: Quitting smoking. Maintaining a healthy weight. Staying active. Try to get 150 minutes of moderate-intensity exercise every week. Ask  your health care provider which activities are safe for you. Eating a healthy diet. Avoid high-fat foods, like fried foods. Avoid refined carbohydrates like white bread and white rice. Limit how much alcohol and caffeine you drink. Increase your fiber intake. Healthy sources of fiber include beans, whole grains, and fresh fruits and vegetables. Behavioral changes, such as: Pelvic floor muscle exercises. Bladder training, such as lengthening the amount of time between bathroom breaks, or using the bathroom at regular intervals. Using techniques to suppress bladder urges. This can include distraction techniques or controlled breathing exercises. Medicines, such as: Medicines to relax the bladder muscles and prevent bladder spasms. Medicines to help slow or prevent the growth of a man's prostate. Botox injections. These can help relax the bladder muscles. Treatments, such as: Using pulses of electricity to help change bladder reflexes (electrical nerve stimulation). For women, using a medical device to prevent urine leaks. This is a small, tampon-like, disposable device that is inserted into the urethra. Injecting collagen or carbon beads (bulking agents) into the urinary sphincter. These can help thicken tissue and close the bladder opening. Surgery. Follow these instructions at home: Lifestyle Limit alcohol and caffeine. These can fill your bladder quickly and irritate it. Keep yourself clean to help prevent odors and skin damage. Ask your health care provider about special skin creams and cleansers that can protect the skin from urine. Consider wearing pads or adult diapers. Make sure to change them regularly, and always change them right after experiencing incontinence. General instructions Take over-the-counter and prescription medicines only as told by your health care provider. Use the bathroom about every 3-4 hours, even if you do not feel the need to urinate. Try to empty your bladder  completely every time. After urinating, wait a minute. Then try to urinate again. Make sure you are in a relaxed position while urinating. If your incontinence is caused by nerve problems, keep a log of the medicines you take and the times you go to the bathroom. Keep all follow-up visits. This is important. Where to find more information [employer] of Diabetes and Digestive and Kidney Diseases: CarFli[employer].tn American Urology Association: www.urologyhealth.org Contact a health care provider if: You have pain that gets worse. Your incontinence gets worse. Get help right away if: You have a fever or chills. You are unable to urinate. You have redness in your groin area or down your legs. Summary Urinary incontinence refers to a condition in which a person is unable to control where and when to pass urine. This condition may be caused by medicines, infection, weak bladder muscles, weak pelvic floor muscles, enlargement of the prostate (in men), or surgery. Factors such as older age, obesity, pregnancy and childbirth, menopause, neurological diseases, and chronic coughing may increase your risk for developing this condition. Types of urinary incontinence include urge incontinence, stress incontinence, overflow incontinence, and functional incontinence. This condition is usually treated first with lifestyle and behavioral changes, such as quitting smoking, eating a healthier diet, and doing regular pelvic floor exercises. Other treatment options include medicines, bulking agents, medical devices, electrical nerve stimulation, or surgery. This information is not intended to replace advice given to you by your health care provider. Make sure you discuss any questions you have with your health care provider. Document Revised: 05/04/2020 Document Reviewed: 05/04/2020 Elsevier Patient Education  2024  [employer].

## 2023-04-13 ENCOUNTER — Telehealth: Payer: Self-pay | Admitting: Medical

## 2023-04-13 LAB — URINE CULTURE

## 2023-04-13 NOTE — Telephone Encounter (Signed)
Pt's husband Mellody Dance was notified and will stop medication for a week and see if that helps

## 2023-04-13 NOTE — Progress Notes (Signed)
Urine culture doesn't show infection.  Lets see if urine frequency improves as we discussed by cutting back on Namenda for a week.    If not, we can try a medication for overactive bladder or referral to urology

## 2023-04-13 NOTE — Telephone Encounter (Signed)
Pt husband called you back and you weren't available so I advised the lab result notes I seen from [place]. He is wanting to know if they should schedule an appointment for a 1 week fu or wait a week see how things go and then if not improved schedule an appointment?

## 2023-04-15 ENCOUNTER — Encounter (HOSPITAL_COMMUNITY): Payer: Self-pay

## 2023-04-15 ENCOUNTER — Other Ambulatory Visit: Payer: Self-pay

## 2023-04-15 ENCOUNTER — Emergency Department (HOSPITAL_COMMUNITY): Payer: PPO

## 2023-04-15 ENCOUNTER — Emergency Department (HOSPITAL_COMMUNITY)
Admission: EM | Admit: 2023-04-15 | Discharge: 2023-04-15 | Disposition: A | Discharge status: Home / Self Care | Payer: PPO | Attending: Emergency Medicine | Admitting: Emergency Medicine

## 2023-04-15 DIAGNOSIS — F0390 Unspecified dementia without behavioral disturbance: Secondary | ICD-10-CM | POA: Insufficient documentation

## 2023-04-15 DIAGNOSIS — R4182 Altered mental status, unspecified: Secondary | ICD-10-CM | POA: Insufficient documentation

## 2023-04-15 DIAGNOSIS — F03A0 Unspecified dementia, mild, without behavioral disturbance, psychotic disturbance, mood disturbance, and anxiety: Secondary | ICD-10-CM

## 2023-04-15 DIAGNOSIS — F29 Unspecified psychosis not due to a substance or known physiological condition: Secondary | ICD-10-CM | POA: Diagnosis not present

## 2023-04-15 DIAGNOSIS — I6782 Cerebral ischemia: Secondary | ICD-10-CM | POA: Diagnosis not present

## 2023-04-15 DIAGNOSIS — R9089 Other abnormal findings on diagnostic imaging of central nervous system: Secondary | ICD-10-CM | POA: Diagnosis not present

## 2023-04-15 LAB — CBC
HCT: 39.6 % (ref 36.0–46.0)
Hemoglobin: 12.8 g/dL (ref 12.0–15.0)
MCH: 31.1 pg (ref 26.0–34.0)
MCHC: 32.3 g/dL (ref 30.0–36.0)
MCV: 96.1 fL (ref 80.0–100.0)
Platelets: 162 10*3/uL (ref 150–400)
RBC: 4.12 MIL/uL (ref 3.87–5.11)
RDW: 13.1 % (ref 11.5–15.5)
WBC: 7.2 10*3/uL (ref 4.0–10.5)
nRBC: 0 % (ref 0.0–0.2)

## 2023-04-15 LAB — URINALYSIS, ROUTINE W REFLEX MICROSCOPIC
Bacteria, UA: NONE SEEN
Bilirubin Urine: NEGATIVE
Glucose, UA: NEGATIVE mg/dL
Hgb urine dipstick: NEGATIVE
Ketones, ur: 20 mg/dL — AB
Nitrite: NEGATIVE
Protein, ur: 30 mg/dL — AB
Specific Gravity, Urine: 1.021 (ref 1.005–1.030)
pH: 5 (ref 5.0–8.0)

## 2023-04-15 LAB — COMPREHENSIVE METABOLIC PANEL
ALT: 14 U/L (ref 0–44)
AST: 26 U/L (ref 15–41)
Albumin: 4.1 g/dL (ref 3.5–5.0)
Alkaline Phosphatase: 73 U/L (ref 38–126)
Anion gap: 7 (ref 5–15)
BUN: 15 mg/dL (ref 8–23)
CO2: 25 mmol/L (ref 22–32)
Calcium: 9 mg/dL (ref 8.9–10.3)
Chloride: 108 mmol/L (ref 98–111)
Creatinine, Ser: 0.88 mg/dL (ref 0.44–1.00)
GFR, Estimated: >60 mL/min (ref >60)
Glucose, Bld: 108 mg/dL — ABNORMAL HIGH (ref 70–99)
Potassium: 3.7 mmol/L (ref 3.5–5.1)
Sodium: 140 mmol/L (ref 135–145)
Total Bilirubin: 0.9 mg/dL (ref 0.3–1.2)
Total Protein: 7.1 g/dL (ref 6.5–8.1)
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
## 2023-04-20 ENCOUNTER — Telehealth: Payer: Self-pay | Admitting: *Deleted

## 2023-04-20 NOTE — Telephone Encounter (Signed)
Transition Care Management Follow-up Telephone Call Date of discharge and from where: [facility]  How have you been since you were released from the hospital? About the same Any questions or concerns? No  Items Reviewed: Did the pt receive and understand the discharge instructions provided? Yes  Medications obtained and verified? No  Other? No  Any new allergies since your discharge? No  Dietary orders reviewed? No Do you have support at home? Yes      Follow up appointments reviewed:  Are transportation arrangements needed? no If their condition worsens, is the pt aware to call PCP or go to the Emergency Dept.? Yes Was the patient provided with contact information for the PCP's office or ED? Yes Was to pt encouraged to call back with questions or concerns? Yes

## 2023-04-22 ENCOUNTER — Telehealth: Payer: Self-pay | Admitting: *Deleted

## 2023-04-27 ENCOUNTER — Ambulatory Visit: Payer: PPO | Admitting: Medical

## 2023-04-27 VITALS — BP 120/70 | HR 67 | Wt 137.4 lb

## 2023-04-27 DIAGNOSIS — F0390 Unspecified dementia without behavioral disturbance: Secondary | ICD-10-CM | POA: Diagnosis not present

## 2023-04-27 DIAGNOSIS — R9089 Other abnormal findings on diagnostic imaging of central nervous system: Secondary | ICD-10-CM | POA: Diagnosis not present

## 2023-04-27 DIAGNOSIS — T7401XD Adult neglect or abandonment, confirmed, subsequent encounter: Secondary | ICD-10-CM

## 2023-04-27 DIAGNOSIS — E039 Hypothyroidism, unspecified: Secondary | ICD-10-CM

## 2023-04-27 DIAGNOSIS — Q845 Enlarged and hypertrophic nails: Secondary | ICD-10-CM

## 2023-04-27 DIAGNOSIS — Z741 Need for assistance with personal care: Secondary | ICD-10-CM | POA: Diagnosis not present

## 2023-04-27 DIAGNOSIS — F419 Anxiety disorder, unspecified: Secondary | ICD-10-CM | POA: Diagnosis not present

## 2023-04-27 DIAGNOSIS — T7401XA Adult neglect or abandonment, confirmed, initial encounter: Secondary | ICD-10-CM | POA: Insufficient documentation

## 2023-04-27 DIAGNOSIS — R4182 Altered mental status, unspecified: Secondary | ICD-10-CM
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
## 2023-04-28 ENCOUNTER — Telehealth: Payer: Self-pay | Admitting: Medical

## 2023-04-28 NOTE — Telephone Encounter (Signed)
I am expecting a call back from [facility] [employer] regarding this patient

## 2023-04-28 NOTE — Progress Notes (Signed)
STAT home health and social worker placed

## 2023-04-29 ENCOUNTER — Ambulatory Visit (INDEPENDENT_AMBULATORY_CARE_PROVIDER_SITE_OTHER): Payer: PPO | Admitting: Medical

## 2023-04-29 ENCOUNTER — Encounter: Payer: Self-pay | Admitting: Medical

## 2023-04-29 VITALS — BP 138/88 | HR 80 | Ht 66.0 in | Wt 136.8 lb

## 2023-04-29 DIAGNOSIS — L602 Onychogryphosis: Secondary | ICD-10-CM

## 2023-04-29 DIAGNOSIS — R350 Frequency of micturition: Secondary | ICD-10-CM | POA: Diagnosis not present

## 2023-04-29 DIAGNOSIS — R222 Localized swelling, mass and lump, trunk: Secondary | ICD-10-CM | POA: Diagnosis not present

## 2023-04-29 NOTE — Progress Notes (Signed)
Subjective:  Darlene Ballard is a 77 y.o. female who presents for Chief Complaint  Patient presents with   Mass    Mass on back. Left side behind under arm.      Here with daughter Darlene Ballard.  I saw her 2 days ago for concerns about back pain for hospital follow-up.  She says home health nurse did call but then declined to come out because they were waiting on our office presumably to get a hold of [facility] [employer] [facility].  She has not heard back yet about urology or podiatry referrals as discussed on Monday.  She is taking nails and wants to see podiatry.  She has been having issues with urinary frequency.  Her urinary frequency has improved since coming off Namenda short-term  Her main concern today is a lump on the back.  She has stayed with her daughter Darlene Ballard last few nights and they helped get her shower to clean her up.  They noticed a lump on her left upper back/armpit area.  They do not know how long the lump has been there.  No other aggravating or relieving factors.    No other c/o.   The following portions of the patient's history were reviewed and updated as appropriate: allergies, current medications, past family history, past medical history, past social history, past surgical history and problem list.  ROS Otherwise as in subjective above  Objective: BP 138/88   Pulse 80   Ht 5\' 6"  (1.676 m)   Wt 136 lb 12.8 oz (62.1 kg)   BMI 22.08 kg/m   General appearance: alert, no distress, well developed, well nourished Left upper back just posterior to the axilla with a fatty texture somewhat spongi area of skin approximately 7 to 8 cm diameter suggestive of a lipoma.  No discoloration, no sign of mass, no fluctuance warmth or induration  Assessment: Encounter Diagnoses  Name Primary?   Mass of skin of back Yes   Hypertrophic toenail    Urinary frequency      Plan: We discussed the past scan.  I suspect this is a benign lipoma.  Nonetheless we will refer for a  ultrasound soft tissue  Hypertrophic toenails-await phone call about appointment referral for podiatry  Urinary frequency-I advised daughter have her add back Namenda 5 mg twice a day and watch for any urinary changes.  Continue Aricept and Synthroid as usual.  Await urology referral phone call as well  Darlene Ballard was seen today for mass.  Diagnoses and all orders for this visit:  Mass of skin of back -     [place] AXILLA LEFT; Future  Hypertrophic toenail -     Ambulatory referral to Podiatry  Urinary frequency -     Ambulatory referral to Urology    Follow up: Pending referrals, call back

## 2023-05-01 ENCOUNTER — Telehealth: Payer: Self-pay | Admitting: Medical

## 2023-05-01 NOTE — Telephone Encounter (Signed)
Collie Siad, daughter, dropped off forms to be completed for [employer], sent back in folder

## 2023-05-05 ENCOUNTER — Ambulatory Visit: Payer: PPO | Admitting: Urology

## 2023-05-05 ENCOUNTER — Encounter: Payer: Self-pay | Admitting: Urology

## 2023-05-05 ENCOUNTER — Telehealth: Payer: Self-pay | Admitting: Physician Assistant

## 2023-05-05 VITALS — BP 146/74 | HR 59 | Ht 64.0 in | Wt 137.0 lb

## 2023-05-05 DIAGNOSIS — R399 Unspecified symptoms and signs involving the genitourinary system: Secondary | ICD-10-CM

## 2023-05-05 DIAGNOSIS — R3915 Urgency of urination: Secondary | ICD-10-CM | POA: Diagnosis not present

## 2023-05-05 DIAGNOSIS — R32 Unspecified urinary incontinence: Secondary | ICD-10-CM | POA: Diagnosis not present

## 2023-05-05 DIAGNOSIS — R350 Frequency of micturition: Secondary | ICD-10-CM | POA: Diagnosis not present

## 2023-05-05 LAB — URINALYSIS, ROUTINE W REFLEX MICROSCOPIC
Bilirubin, UA: NEGATIVE
Glucose, UA: NEGATIVE
Ketones, UA: NEGATIVE
Leukocytes,UA: NEGATIVE
Nitrite, UA: NEGATIVE
Protein,UA: NEGATIVE
RBC, UA: NEGATIVE
Specific Gravity, UA: 1.025 (ref 1.005–1.030)
Urobilinogen, Ur: 1 mg/dL (ref 0.2–1.0)
pH, UA: 6.5 (ref 5.0–7.5)

## 2023-05-05 LAB — BLADDER SCAN AMB NON-IMAGING
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
## 2023-05-05 NOTE — Telephone Encounter (Signed)
Mother has been taken out of the house, currently living with daughter.

## 2023-05-05 NOTE — Telephone Encounter (Signed)
Patient's husband is needing a call back .

## 2023-05-06 ENCOUNTER — Encounter: Payer: Self-pay | Admitting: Podiatry

## 2023-05-06 ENCOUNTER — Ambulatory Visit: Payer: PPO | Admitting: Podiatry

## 2023-05-06 DIAGNOSIS — L603 Nail dystrophy: Secondary | ICD-10-CM | POA: Diagnosis not present

## 2023-05-06 DIAGNOSIS — L600 Ingrowing nail: Secondary | ICD-10-CM

## 2023-05-06 NOTE — Progress Notes (Signed)
  Subjective:  Patient ID: Darlene Ballard, female    DOB: Jul 15, 1946,   MRN: [id number]  Chief Complaint  Patient presents with   Nail Problem    Long thick big toenail on both feet difficult to cut. Pt wants all toenails trimmed if possible.  Both big toenails Painful sometimes.    77 y.o. female presents for painful and tick toenails that have been present for several years. Patient requesting to have them removed today. They have been painful. Here today with her daughter.  . Denies any other pedal complaints. Denies n/v/f/c.   Past Medical History:  Diagnosis Date   Memory loss     Objective:  Physical Exam: Vascular: DP/PT pulses 2/4 bilateral. CFT <3 seconds. Normal hair growth on digits. No edema.  Skin. No lacerations or abrasions bilateral feet. Thickened discolored and elongated toenails with subungual debris 1-5 b/l. Bilatearl hallux nails dystrophic and painful.  Musculoskeletal: MMT 5/5 bilateral lower extremities in DF, PF, Inversion and Eversion. Deceased ROM in DF of ankle joint.  Neurological: Sensation intact to light touch.   Assessment:   1. Onychodystrophy   2. Ingrown right greater toenail   3. Ingrown left greater toenail       Plan:  Patient was evaluated and treated and all questions answered. -Discussed onychomycosis and treatment options. Patient would like to have nails trimmed.  Discussed ingrown toenails etiology and treatment options including procedure for removal vs conservative care.  Patient requesting removal of ingrown nail today. Procedure below.  Discussed procedure and post procedure care and patient expressed understanding.  Will follow-up in 2 weeks for nail check or sooner if any problems arise.    Procedure:  Procedure: total Nail Avulsion of bilaterally hallux nail Surgeon: Louann Sjogren, DPM  Pre-op Dx: dystrophic toenail without infection Post-op: Same  Place of Surgery: Office exam room.  Indications for surgery: Painful  and dystrophic toenail.    The patient is requesting removal of nail with chemical matrixectomy. Risks and complications were discussed with the patient for which they understand and written consent was obtained. Under sterile conditions a total of 3 mL of  1% lidocaine plain was infiltrated in a hallux block fashion. Once anesthetized, the skin was prepped in sterile fashion. A tourniquet was then applied. Next the entire hallux nail bilateral was removed.  Next phenol was then applied under standard conditions and copiously irrigated. Silvadene was applied. A dry sterile dressing was applied. After application of the dressing the tourniquet was removed and there is found to be an immediate capillary refill time to the digit. The patient tolerated the procedure well without any complications. Post procedure instructions were discussed the patient for which he verbally understood. Follow-up in two weeks for nail check or sooner if any problems are to arise. Discussed signs/symptoms of infection and directed to call the office immediately should any occur or go directly to the emergency room. In the meantime, encouraged to call the office with any questions, concerns, changes symptoms.    Louann Sjogren, DPM

## 2023-05-06 NOTE — Telephone Encounter (Signed)
Husband has called and is demanding to speak to [place] . He is very upset because he has not his called returned I did tell him office policy for calls as well as the fact that Huntley Dec is in clinic. I was very careful about what I said to this patients husband

## 2023-05-06 NOTE — Patient Instructions (Signed)
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
## 2023-05-11 ENCOUNTER — Telehealth: Payer: Self-pay | Admitting: Medical

## 2023-05-11 NOTE — Telephone Encounter (Signed)
Darlene Ballard is asking if you can put the referral in for Zelaya to go back to urology since she never got to go for prior issue, she wants to make sure everything is ok.  Also she wanted you to know she has a guardianship court date for 06/25/23

## 2023-05-11 NOTE — Telephone Encounter (Signed)
Referral has been sent to [facility]- so it is still in review status

## 2023-05-12 ENCOUNTER — Other Ambulatory Visit: Payer: Self-pay | Admitting: Internal Medicine
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
## 2023-05-13 ENCOUNTER — Other Ambulatory Visit: Payer: Self-pay | Admitting: Medical

## 2023-05-13 DIAGNOSIS — R222 Localized swelling, mass and lump, trunk: Secondary | ICD-10-CM

## 2023-05-19 ENCOUNTER — Ambulatory Visit (INDEPENDENT_AMBULATORY_CARE_PROVIDER_SITE_OTHER): Payer: PPO

## 2023-05-19 ENCOUNTER — Ambulatory Visit (INDEPENDENT_AMBULATORY_CARE_PROVIDER_SITE_OTHER): Payer: PPO | Admitting: Podiatry

## 2023-05-19 DIAGNOSIS — L600 Ingrowing nail: Secondary | ICD-10-CM

## 2023-05-19 DIAGNOSIS — L603 Nail dystrophy: Secondary | ICD-10-CM

## 2023-05-19 DIAGNOSIS — Z0000 Encounter for general adult medical examination without abnormal findings: Secondary | ICD-10-CM | POA: Diagnosis not present

## 2023-05-19 NOTE — Progress Notes (Signed)
  Subjective:  Patient ID: AMIYHA Darlene Ballard, female    DOB: May 30, 1946,   MRN: [id number]  No chief complaint on file.   77 y.o. female presents for follow-up of bilateral hallux nail removals. Relates soaking as instructed and not having too much pain . Denies any other pedal complaints. Denies n/v/f/c.   Past Medical History:  Diagnosis Date   Memory loss     Objective:  Physical Exam: Vascular: DP/PT pulses 2/4 bilateral. CFT <3 seconds. Normal hair growth on digits. No edema.  Skin. No lacerations or abrasions bilateral feet. Bilateral hallux nails healing well.  Musculoskeletal: MMT 5/5 bilateral lower extremities in DF, PF, Inversion and Eversion. Deceased ROM in DF of ankle joint.  Neurological: Sensation intact to light touch.   Assessment:   1. Ingrown right greater toenail   2. Ingrown left greater toenail   3. Onychodystrophy      Plan:  Patient was evaluated and treated and all questions answered. Toe was evaluated and appears to be healing well.  May discontinue soaks and neosporin.  Patient to follow-up as needed.    Louann Sjogren, DPM

## 2023-05-19 NOTE — Progress Notes (Cosign Needed)
Subjective:   Darlene Ballard is a 77 y.o. female who presents for Medicare Annual (Subsequent) preventive examination.  Visit Complete: Virtual  I connected with  Darlene Ballard on 05/19/23 by a audio enabled telemedicine application and verified that I am speaking with the correct person using two identifiers. Daughter Darlene Ballard was also on the call.  Patient Location: Home  Provider Location: Office/Clinic  I discussed the limitations of evaluation and management by telemedicine. The patient expressed understanding and agreed to proceed.  Vital Signs: Patient was unable to self-report vital signs via telehealth due to a lack of equipment at home.  Review of Systems     Cardiac Risk Factors include: advanced age (>58men, >85 women);dyslipidemia;hypertension     Objective:    Today's Vitals   There is no height or weight on file to calculate BMI.     05/19/2023    2:01 PM 04/15/2023    6:54 PM 03/10/2023   11:23 AM 08/26/2022   10:46 AM 06/10/2022   11:42 AM 06/06/2022    9:04 AM 01/24/2022   10:57 AM  Advanced Directives  Does Patient Have a Medical Advance Directive? No Yes No No No Yes No  Would patient like information on creating a medical advance directive?   No - Patient declined  Yes (ED - Information included in AVS)      Current Medications (verified) Outpatient Encounter Medications as of 05/19/2023  Medication Sig   levothyroxine (SYNTHROID) 88 MCG tablet Take 1 tablet (88 mcg total) by mouth daily.   donepezil (ARICEPT) 10 MG tablet TAKE   1 TABLET (10 MG) DAILY. (Patient not taking: Reported on 05/05/2023)   memantine (NAMENDA) 10 MG tablet Take 1 tablet (10 mg total) by mouth 2 (two) times daily. (Patient not taking: Reported on 05/19/2023)   No facility-administered encounter medications on file as of 05/19/2023.    Allergies (verified) Patient has no known allergies.   History: Past Medical History:  Diagnosis Date   Memory loss    Past Surgical History:   Procedure Laterality Date   TONSILLECTOMY AND ADENOIDECTOMY     Family History  Problem Relation Age of Onset   Alzheimer's disease Mother    Heart disease Mother    Heart attack Mother    Other Father        blood disorder - unsure of condition   Social History   Socioeconomic History   Marital status: Married    Spouse name: Not on file   Number of children: 1   Years of education: 2 years college   Highest education level: Not on file  Occupational History   Occupation: Retired  Tobacco Use   Smoking status: Never   Smokeless tobacco: Never  Vaping Use   Vaping status: Never Used  Substance and Sexual Activity   Alcohol use: Never   Drug use: Never   Sexual activity: Not Currently  Other Topics Concern   Not on file  Social History Narrative   Lives at home with husband.   Right-handed.   2 cups caffeine per day.   Social Determinants of Health   Financial Resource Strain: Low Risk  (05/19/2023)   Overall Financial Resource Strain (CARDIA)    Difficulty of Paying Living Expenses: Not hard at all  Food Insecurity: No Food Insecurity (05/19/2023)   Hunger Vital Sign    Worried About Running Out of Food in the Last Year: Never true    Ran Out of Food in  the Last Year: Never true  Transportation Needs: No Transportation Needs (05/19/2023)   PRAPARE - Administrator, Civil Service (Medical): No    Lack of Transportation (Non-Medical): No  Physical Activity: Sufficiently Active (05/19/2023)   Exercise Vital Sign    Days of Exercise per Week: 7 days    Minutes of Exercise per Session: 30 min  Stress: No Stress Concern Present (05/19/2023)   [employer] of Occupational Health - Occupational Stress Questionnaire    Feeling of Stress : Not at all  Social Connections: Moderately Isolated (05/19/2023)   Social Connection and Isolation Panel [NHANES]    Frequency of Communication with Friends and Family: More than three times a week    Frequency of Social  Gatherings with Friends and Family: More than three times a week    Attends Religious Services: Never    Database administrator or Organizations: No    Attends Engineer, structural: Never    Marital Status: Married    Tobacco Counseling Counseling given: Not Answered   Clinical Intake:  Pre-visit preparation completed: Yes  Pain : No/denies pain     Nutritional Risks: None Diabetes: No  How often do you need to have someone help you when you read instructions, pamphlets, or other written materials from your doctor or pharmacy?: 4 - Often  Interpreter Needed?: No  Information entered by :: NAllen LPN   Activities of Daily Living    05/19/2023    1:56 PM 06/10/2022   11:42 AM  In your present state of health, do you have any difficulty performing the following activities:  Hearing? 0 0  Vision? 0 1  Comment  she states she wears glasses  Difficulty concentrating or making decisions? 1 1  Comment dementia   Walking or climbing stairs? 0 0  Dressing or bathing? 0 0  Doing errands, shopping? 1 0  Preparing Food and eating ? Y N  Using the Toilet? N N  In the past six months, have you accidently leaked urine? Y N  Do you have problems with loss of bowel control? N N  Managing your Medications? Y Y  Comment  "he" gives them to her  Managing your Finances? Y Y  Comment  she doesn't have to do them, "he" does them  Housekeeping or managing your Housekeeping? Y N    Patient Care Team: Tysinger, Kermit Balo, PA-C as PCP - General (Family Medicine) Van Clines, MD as Consulting Physician (Neurology)  Indicate any recent Medical Services you may have received from other than [facility] providers in the past year (date may be approximate).     Assessment:   This is a routine wellness examination for Darlene Ballard.  Hearing/Vision screen Hearing Screening - Comments:: Denies hearing issues Vision Screening - Comments:: No regular eye exams  Dietary issues and exercise  activities discussed:     Goals Addressed             This Visit's Progress    Patient Stated       05/19/2023, denies goals at this time       Depression Screen    05/19/2023    2:02 PM 08/06/2022    3:07 PM 06/10/2022   11:40 AM 06/09/2021   10:29 AM 09/17/2020    1:51 PM 02/02/2020    4:10 PM 08/16/2019    9:43 AM  PHQ 2/9 Scores  PHQ - 2 Score 0 0 0 0 0 1 0  PHQ-  9 Score 3     2 1     Fall Risk    05/19/2023    2:01 PM 04/10/2023    2:10 PM 03/10/2023   11:23 AM 08/26/2022   10:46 AM 08/06/2022    3:07 PM  Fall Risk   Falls in the past year? 0 0 0 0 0  Number falls in past yr: 0 0 0 0 0  Injury with Fall? 0 0 0 0 0  Risk for fall due to : Medication side effect No Fall Risks   No Fall Risks  Follow up Falls prevention discussed;Falls evaluation completed Falls prevention discussed;Falls evaluation completed Falls evaluation completed Falls evaluation completed Falls evaluation completed    MEDICARE RISK AT HOME:  Medicare Risk at Home - 05/19/23 1402     Any stairs in or around the home? Yes    If so, are there any without handrails? Yes    Home free of loose throw rugs in walkways, pet beds, electrical cords, etc? Yes    Adequate lighting in your home to reduce risk of falls? Yes    Life alert? No    Use of a cane, walker or w/c? No    Grab bars in the bathroom? No    Shower chair or bench in shower? Yes    Elevated toilet seat or a handicapped toilet? Yes             TIMED UP AND GO:  Was the test performed?  No    Cognitive Function:  6 CIT not administered. Patient has a diagnosis of dementia.      03/21/2020    3:00 PM 01/03/2020    7:19 AM 08/09/2019    3:15 PM 12/15/2018    9:49 AM 09/01/2018    9:06 AM  MMSE - Mini Mental State Exam  Orientation to time 2 0 3 2 3   Orientation to Place 3 3 4 4 5   Registration 3 3 3 3 3   Attention/ Calculation 1 1 1 1 2   Recall 0 0 0 2 2  Language- name 2 objects 2 2 2 2 2   Language- repeat 1 1 1 1 1    Language- follow 3 step command 3 3 3 2 1   Language- read & follow direction 1 1 1 1 1   Write a sentence 1 1 1  0 1  Copy design 1 1 1 1 1   Total score 18 16 20 19 22       12/11/2020    8:52 AM  Montreal Cognitive Assessment   Visuospatial/ Executive (0/5) 0  Naming (0/3) 1  Attention: Read list of digits (0/2) 1  Attention: Read list of letters (0/1) 0  Attention: Serial 7 subtraction starting at 100 (0/3) 1  Language: Repeat phrase (0/2) 0  Language : Fluency (0/1) 0  Abstraction (0/2) 1  Delayed Recall (0/5) 0  Orientation (0/6) 0  Total 4  Adjusted Score (based on education) 4      06/09/2021   10:31 AM  6CIT Screen  What Year? 0 points  What month? 0 points  What time? 0 points  Count back from 20 0 points  Months in reverse 4 points  Repeat phrase 10 points  Total Score 14 points    Immunizations Immunization History  Administered Date(s) Administered   Fluad Quad(high Dose 65+) 06/26/2021   Influenza, High Dose Seasonal PF 06/30/2022   Influenza-Unspecified 10/21/2018   PFIZER Comirnaty(Gray Top)Covid-19 Tri-Sucrose Vaccine 04/18/2020, 05/11/2020, 02/26/2021  Research officer, trade union 43yrs & up 07/24/2021   Pneumococcal Conjugate-13 10/21/2018   Tdap 07/25/2015    TDAP status: Up to date  Flu Vaccine status: Due, Education has been provided regarding the importance of this vaccine. Advised may receive this vaccine at local pharmacy or Health Dept. Aware to provide a copy of the vaccination record if obtained from local pharmacy or Health Dept. Verbalized acceptance and understanding.  Pneumococcal vaccine status: Up to date  Covid-19 vaccine status: Information provided on how to obtain vaccines.   Qualifies for Shingles Vaccine? Yes   Zostavax completed No   Shingrix Completed?: No.    Education has been provided regarding the importance of this vaccine. Patient has been advised to call insurance company to determine out of pocket  expense if they have not yet received this vaccine. Advised may also receive vaccine at local pharmacy or Health Dept. Verbalized acceptance and understanding.  Screening Tests Health Maintenance  Topic Date Due   Hepatitis C Screening  Never done   Colonoscopy  Never done   Zoster Vaccines- Shingrix (1 of 2) Never done   DEXA SCAN  Never done   Pneumonia Vaccine 93+ Years old (2 of 2 - PPSV23 or PCV20) 10/22/2019   COVID-19 Vaccine (5 - 2023-24 season) 06/13/2022   INFLUENZA VACCINE  05/14/2023   Medicare Annual Wellness (AWV)  05/18/2024   DTaP/Tdap/Td (2 - Td or Tdap) 07/24/2025   HPV VACCINES  Aged Out    Health Maintenance  Health Maintenance Due  Topic Date Due   Hepatitis C Screening  Never done   Colonoscopy  Never done   Zoster Vaccines- Shingrix (1 of 2) Never done   DEXA SCAN  Never done   Pneumonia Vaccine 107+ Years old (2 of 2 - PPSV23 or PCV20) 10/22/2019   COVID-19 Vaccine (5 - 2023-24 season) 06/13/2022   INFLUENZA VACCINE  05/14/2023    Colorectal cancer screening: No longer required.   Mammogram status: scheduled for 05/29/2023  Bone Density status: will talk to [facility]  Lung Cancer Screening: (Low Dose CT Chest recommended if Age 68-80 years, 20 pack-year currently smoking OR have quit w/in 15years.) does not qualify.   Lung Cancer Screening Referral: no  Additional Screening:  Hepatitis C Screening: does qualify;   Vision Screening: Recommended annual ophthalmology exams for early detection of glaucoma and other disorders of the eye. Is the patient up to date with their annual eye exam?  No  Who is the provider or what is the name of the office in which the patient attends annual eye exams? none If pt is not established with a provider, would they like to be referred to a provider to establish care? No .   Dental Screening: Recommended annual dental exams for proper oral hygiene  Diabetic Foot Exam: n/a  Community Resource Referral / Chronic Care  Management: CRR required this visit?  No   CCM required this visit?  No     Plan:     I have personally reviewed and noted the following in the patient's chart:   Medical and social history Use of alcohol, tobacco or illicit drugs  Current medications and supplements including opioid prescriptions. Patient is not currently taking opioid prescriptions. Functional ability and status Nutritional status Physical activity Advanced directives List of other physicians Hospitalizations, surgeries, and ER visits in previous 12 months Vitals Screenings to include cognitive, depression, and falls Referrals and appointments  In addition, I have reviewed and discussed with  patient certain preventive protocols, quality metrics, and best practice recommendations. A written personalized care plan for preventive services as well as general preventive health recommendations were provided to patient.     Barb Merino, LPN   10/21/1476   After Visit Summary: (MyChart) Due to this being a telephonic visit, the after visit summary with patients personalized plan was offered to patient via MyChart   Nurse Notes: none

## 2023-05-19 NOTE — Patient Instructions (Signed)
[name] , Thank you for taking time to come for your Medicare Wellness Visit. I appreciate your ongoing commitment to your health goals. Please review the following plan we discussed and let me know if I can assist you in the future.   Referrals/Orders/Follow-Ups/Clinician Recommendations: none  This is a list of the screening recommended for you and due dates:  Health Maintenance  Topic Date Due   Hepatitis C Screening  Never done   Colon Cancer Screening  Never done   Zoster (Shingles) Vaccine (1 of 2) Never done   DEXA scan (bone density measurement)  Never done   Pneumonia Vaccine (2 of 2 - PPSV23 or PCV20) 10/22/2019   COVID-19 Vaccine (5 - 2023-24 season) 06/13/2022   Flu Shot  05/14/2023   Medicare Annual Wellness Visit  05/18/2024   DTaP/Tdap/Td vaccine (2 - Td or Tdap) 07/24/2025   HPV Vaccine  Aged Out    Advanced directives: (ACP Link)Information on Advanced Care Planning can be found at [facility] [place] Advance Health Care Directives Advance Health Care Directives ([email or web address]   Next Medicare Annual Wellness Visit scheduled for next year: No, wants to wait until next year  Preventive Care 3 Years and Older, Female Preventive care refers to lifestyle choices and visits with your health care provider that can promote health and wellness. What does preventive care include? A yearly physical exam. This is also called an annual well check. Dental exams once or twice a year. Routine eye exams. Ask your health care provider how often you should have your eyes checked. Personal lifestyle choices, including: Daily care of your teeth and gums. Regular physical activity. Eating a healthy diet. Avoiding tobacco and drug use. Limiting alcohol use. Practicing safe sex. Taking low-dose aspirin every day. Taking vitamin and mineral supplements as recommended by your health care provider. What happens during an annual well check? The services and screenings done  by your health care provider during your annual well check will depend on your age, overall health, lifestyle risk factors, and family history of disease. Counseling  Your health care provider may ask you questions about your: Alcohol use. Tobacco use. Drug use. Emotional well-being. Home and relationship well-being. Sexual activity. Eating habits. History of falls. Memory and ability to understand (cognition). Work and work Astronomer. Reproductive health. Screening  You may have the following tests or measurements: Height, weight, and BMI. Blood pressure. Lipid and cholesterol levels. These may be checked every 5 years, or more frequently if you are over 59 years old. Skin check. Lung cancer screening. You may have this screening every year starting at age 67 if you have a 30-pack-year history of smoking and currently smoke or have quit within the past 15 years. Fecal occult blood test (FOBT) of the stool. You may have this test every year starting at age 33. Flexible sigmoidoscopy or colonoscopy. You may have a sigmoidoscopy every 5 years or a colonoscopy every 10 years starting at age 8. Hepatitis C blood test. Hepatitis B blood test. Sexually transmitted disease (STD) testing. Diabetes screening. This is done by checking your blood sugar (glucose) after you have not eaten for a while (fasting). You may have this done every 1-3 years. Bone density scan. This is done to screen for osteoporosis. You may have this done starting at age 49. Mammogram. This may be done every 1-2 years. Talk to your health care provider about how often you should have regular mammograms. Talk with your health care provider about  your test results, treatment options, and if necessary, the need for more tests. Vaccines  Your health care provider may recommend certain vaccines, such as: Influenza vaccine. This is recommended every year. Tetanus, diphtheria, and acellular pertussis (Tdap, Td) vaccine. You  may need a Td booster every 10 years. Zoster vaccine. You may need this after age 66. Pneumococcal 13-valent conjugate (PCV13) vaccine. One dose is recommended after age 63. Pneumococcal polysaccharide (PPSV23) vaccine. One dose is recommended after age 57. Talk to your health care provider about which screenings and vaccines you need and how often you need them. This information is not intended to replace advice given to you by your health care provider. Make sure you discuss any questions you have with your health care provider. Document Released: 10/26/2015 Document Revised: 06/18/2016 Document Reviewed: 07/31/2015 Elsevier Interactive Patient Education  2017 [employer].  Fall Prevention in the Home Falls can cause injuries. They can happen to people of all ages. There are many things you can do to make your home safe and to help prevent falls. What can I do on the outside of my home? Regularly fix the edges of walkways and driveways and fix any cracks. Remove anything that might make you trip as you walk through a door, such as a raised step or threshold. Trim any bushes or trees on the path to your home. Use bright outdoor lighting. Clear any walking paths of anything that might make someone trip, such as rocks or tools. Regularly check to see if handrails are loose or broken. Make sure that both sides of any steps have handrails. Any raised decks and porches should have guardrails on the edges. Have any leaves, snow, or ice cleared regularly. Use sand or salt on walking paths during winter. Clean up any spills in your garage right away. This includes oil or grease spills. What can I do in the bathroom? Use night lights. Install grab bars by the toilet and in the tub and shower. Do not use towel bars as grab bars. Use non-skid mats or decals in the tub or shower. If you need to sit down in the shower, use a plastic, non-slip stool. Keep the floor dry. Clean up any water that spills  on the floor as soon as it happens. Remove soap buildup in the tub or shower regularly. Attach bath mats securely with double-sided non-slip rug tape. Do not have throw rugs and other things on the floor that can make you trip. What can I do in the bedroom? Use night lights. Make sure that you have a light by your bed that is easy to reach. Do not use any sheets or blankets that are too big for your bed. They should not hang down onto the floor. Have a firm chair that has side arms. You can use this for support while you get dressed. Do not have throw rugs and other things on the floor that can make you trip. What can I do in the kitchen? Clean up any spills right away. Avoid walking on wet floors. Keep items that you use a lot in easy-to-reach places. If you need to reach something above you, use a strong step stool that has a grab bar. Keep electrical cords out of the way. Do not use floor polish or wax that makes floors slippery. If you must use wax, use non-skid floor wax. Do not have throw rugs and other things on the floor that can make you trip. What can I do with  my stairs? Do not leave any items on the stairs. Make sure that there are handrails on both sides of the stairs and use them. Fix handrails that are broken or loose. Make sure that handrails are as long as the stairways. Check any carpeting to make sure that it is firmly attached to the stairs. Fix any carpet that is loose or worn. Avoid having throw rugs at the top or bottom of the stairs. If you do have throw rugs, attach them to the floor with carpet tape. Make sure that you have a light switch at the top of the stairs and the bottom of the stairs. If you do not have them, ask someone to add them for you. What else can I do to help prevent falls? Wear shoes that: Do not have high heels. Have rubber bottoms. Are comfortable and fit you well. Are closed at the toe. Do not wear sandals. If you use a stepladder: Make  sure that it is fully opened. Do not climb a closed stepladder. Make sure that both sides of the stepladder are locked into place. Ask someone to hold it for you, if possible. Clearly mark and make sure that you can see: Any grab bars or handrails. First and last steps. Where the edge of each step is. Use tools that help you move around (mobility aids) if they are needed. These include: Canes. Walkers. Scooters. Crutches. Turn on the lights when you go into a dark area. Replace any light bulbs as soon as they burn out. Set up your furniture so you have a clear path. Avoid moving your furniture around. If any of your floors are uneven, fix them. If there are any pets around you, be aware of where they are. Review your medicines with your doctor. Some medicines can make you feel dizzy. This can increase your chance of falling. Ask your doctor what other things that you can do to help prevent falls. This information is not intended to replace advice given to you by your health care provider. Make sure you discuss any questions you have with your health care provider. Document Released: 07/26/2009 Document Revised: 03/06/2016 Document Reviewed: 11/03/2014 Elsevier Interactive Patient Education  2017 [employer].

## 2023-05-20 ENCOUNTER — Other Ambulatory Visit: Payer: PPO

## 2023-05-25 ENCOUNTER — Other Ambulatory Visit: Payer: Self-pay | Admitting: Medical

## 2023-05-25 MED ORDER — FLUOXETINE HCL 10 MG PO CAPS: 10.0000 mg | ORAL_CAPSULE | Freq: Every day | ORAL | 0 refills | Status: DC

## 2023-05-29 ENCOUNTER — Other Ambulatory Visit: Payer: PPO

## 2023-06-06 ENCOUNTER — Emergency Department (HOSPITAL_COMMUNITY): Admission: EM | Admit: 2023-06-06 | Payer: PPO | Source: Home / Self Care

## 2023-06-06 DIAGNOSIS — Z79899 Other long term (current) drug therapy: Secondary | ICD-10-CM | POA: Diagnosis not present

## 2023-06-06 DIAGNOSIS — E876 Hypokalemia: Secondary | ICD-10-CM | POA: Insufficient documentation

## 2023-06-06 DIAGNOSIS — R462 Strange and inexplicable behavior: Secondary | ICD-10-CM | POA: Diagnosis not present

## 2023-06-06 DIAGNOSIS — R451 Restlessness and agitation: Secondary | ICD-10-CM | POA: Diagnosis not present

## 2023-06-06 DIAGNOSIS — F0390 Unspecified dementia without behavioral disturbance: Secondary | ICD-10-CM | POA: Diagnosis not present

## 2023-06-06 DIAGNOSIS — R4182 Altered mental status, unspecified: Secondary | ICD-10-CM | POA: Insufficient documentation

## 2023-06-06 DIAGNOSIS — F29 Unspecified psychosis not due to a substance or known physiological condition: Secondary | ICD-10-CM | POA: Diagnosis not present

## 2023-06-07 ENCOUNTER — Other Ambulatory Visit: Payer: Self-pay

## 2023-06-07 ENCOUNTER — Encounter (HOSPITAL_COMMUNITY): Payer: Self-pay

## 2023-06-07 LAB — CBC WITH DIFFERENTIAL/PLATELET
Abs Immature Granulocytes: 0.01 10*3/uL (ref 0.00–0.07)
Basophils Absolute: 0.1 10*3/uL (ref 0.0–0.1)
Basophils Relative: 1 %
Eosinophils Absolute: 0.2 10*3/uL (ref 0.0–0.5)
Eosinophils Relative: 4 %
HCT: 43.1 % (ref 36.0–46.0)
Hemoglobin: 14.1 g/dL (ref 12.0–15.0)
Immature Granulocytes: 0 %
Lymphocytes Relative: 31 %
Lymphs Abs: 1.8 10*3/uL (ref 0.7–4.0)
MCH: 31 pg (ref 26.0–34.0)
MCHC: 32.7 g/dL (ref 30.0–36.0)
MCV: 94.7 fL (ref 80.0–100.0)
Monocytes Absolute: 0.5 10*3/uL (ref 0.1–1.0)
Monocytes Relative: 8 %
Neutro Abs: 3.3 10*3/uL (ref 1.7–7.7)
Neutrophils Relative %: 56 %
Platelets: 169 10*3/uL (ref 150–400)
RBC: 4.55 MIL/uL (ref 3.87–5.11)
RDW: 12.6 % (ref 11.5–15.5)
WBC: 5.8 10*3/uL (ref 4.0–10.5)
nRBC: 0 % (ref 0.0–0.2)

## 2023-06-07 LAB — BASIC METABOLIC PANEL
Anion gap: 6 (ref 5–15)
BUN: 12 mg/dL (ref 8–23)
CO2: 24 mmol/L (ref 22–32)
Calcium: 8.5 mg/dL — ABNORMAL LOW (ref 8.9–10.3)
Chloride: 101 mmol/L (ref 98–111)
Creatinine, Ser: 0.46 mg/dL (ref 0.44–1.00)
GFR, Estimated: >60 mL/min (ref >60)
Glucose, Bld: 102 mg/dL — ABNORMAL HIGH (ref 70–99)
Potassium: 3.1 mmol/L — ABNORMAL LOW (ref 3.5–5.1)
Sodium: 131 mmol/L — ABNORMAL LOW (ref 135–145)

## 2023-06-07 LAB — URINALYSIS, ROUTINE W REFLEX MICROSCOPIC
Bacteria, UA: NONE SEEN
Bilirubin Urine: NEGATIVE
Glucose, UA: NEGATIVE mg/dL
Ketones, ur: 5 mg/dL — AB
Nitrite: NEGATIVE
Protein, ur: NEGATIVE mg/dL
Specific Gravity, Urine: 1.014 (ref 1.005–1.030)
pH: 6 (ref 5.0–8.0)

## 2023-06-07 MED ORDER — POTASSIUM CHLORIDE CRYS ER 20 MEQ PO TBCR: 40.0000 meq | EXTENDED_RELEASE_TABLET | Freq: Every day | ORAL | 0 refills | Status: DC

## 2023-06-07 NOTE — ED Notes (Signed)
Pt has attempted to void unsuccessfully x 2
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
## 2023-06-07 NOTE — ED Notes (Signed)
Patient and family verbalizes understanding of discharge instructions. Opportunity for questioning and answers were provided. Armband removed by staff, pt discharged from ED. Ambulated out to lobby with family, who will drive her home
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
## 2023-06-07 NOTE — Discharge Instructions (Addendum)
Your family member was seen today for agitation.  There is no evidence of a UTI.  Her potassium was noted to be a little bit low.  Increase potassium rich foods in her diet.  Take medications for the next 5 days.

## 2023-06-08 ENCOUNTER — Telehealth: Payer: Self-pay | Admitting: Medical

## 2023-06-08 DIAGNOSIS — R4182 Altered mental status, unspecified: Secondary | ICD-10-CM

## 2023-06-08 DIAGNOSIS — M1712 Unilateral primary osteoarthritis, left knee: Secondary | ICD-10-CM | POA: Diagnosis not present

## 2023-06-08 DIAGNOSIS — F419 Anxiety disorder, unspecified: Secondary | ICD-10-CM

## 2023-06-08 DIAGNOSIS — M1711 Unilateral primary osteoarthritis, right knee: Secondary | ICD-10-CM | POA: Diagnosis not present

## 2023-06-08 DIAGNOSIS — F0390 Unspecified dementia without behavioral disturbance: Secondary | ICD-10-CM

## 2023-06-08 NOTE — Telephone Encounter (Signed)
Pt daughter called and is requesting a call tomorrow between 8.30 and 2 states she was in the er the weekend and wants to discuss her condition  Her phone number is [phone]

## 2023-06-10 ENCOUNTER — Encounter: Payer: Self-pay | Admitting: Medical

## 2023-06-10 ENCOUNTER — Other Ambulatory Visit: Payer: Self-pay | Admitting: Medical

## 2023-06-10 MED ORDER — OLANZAPINE 5 MG PO TABS: 5.0000 mg | ORAL_TABLET | Freq: Every day | ORAL | 1 refills | Status: DC

## 2023-06-11 NOTE — Telephone Encounter (Signed)
I have put in referral for home health

## 2023-06-12 ENCOUNTER — Encounter: Payer: Self-pay | Admitting: Physician Assistant
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
## 2023-06-13 ENCOUNTER — Other Ambulatory Visit: Payer: Self-pay

## 2023-06-13 ENCOUNTER — Encounter (HOSPITAL_COMMUNITY): Payer: Self-pay

## 2023-06-13 ENCOUNTER — Emergency Department (HOSPITAL_COMMUNITY)
Admission: EM | Admit: 2023-06-13 | Discharge: 2023-06-13 | Disposition: A | Discharge status: Home / Self Care | Payer: PPO

## 2023-06-13 DIAGNOSIS — F0390 Unspecified dementia without behavioral disturbance: Secondary | ICD-10-CM | POA: Insufficient documentation

## 2023-06-13 DIAGNOSIS — R456 Violent behavior: Secondary | ICD-10-CM | POA: Diagnosis not present

## 2023-06-13 DIAGNOSIS — R451 Restlessness and agitation: Secondary | ICD-10-CM | POA: Diagnosis not present

## 2023-06-13 LAB — BASIC METABOLIC PANEL
Anion gap: 7 (ref 5–15)
BUN: 13 mg/dL (ref 8–23)
CO2: 28 mmol/L (ref 22–32)
Calcium: 8.8 mg/dL — ABNORMAL LOW (ref 8.9–10.3)
Chloride: 103 mmol/L (ref 98–111)
Creatinine, Ser: 0.65 mg/dL (ref 0.44–1.00)
GFR, Estimated: >60 mL/min (ref >60)
Glucose, Bld: 113 mg/dL — ABNORMAL HIGH (ref 70–99)
Potassium: 3 mmol/L — ABNORMAL LOW (ref 3.5–5.1)
Sodium: 138 mmol/L (ref 135–145)

## 2023-06-13 LAB — URINALYSIS, ROUTINE W REFLEX MICROSCOPIC
Bilirubin Urine: NEGATIVE
Glucose, UA: NEGATIVE mg/dL
Hgb urine dipstick: NEGATIVE
Ketones, ur: 5 mg/dL — AB
Nitrite: NEGATIVE
Protein, ur: NEGATIVE mg/dL
Specific Gravity, Urine: 1.02 (ref 1.005–1.030)
pH: 5 (ref 5.0–8.0)

## 2023-06-13 LAB — CBC WITH DIFFERENTIAL/PLATELET
Abs Immature Granulocytes: 0.03 10*3/uL (ref 0.00–0.07)
Basophils Absolute: 0 10*3/uL (ref 0.0–0.1)
Basophils Relative: 0 %
Eosinophils Absolute: 0 10*3/uL (ref 0.0–0.5)
Eosinophils Relative: 1 %
HCT: 44.6 % (ref 36.0–46.0)
Hemoglobin: 14.4 g/dL (ref 12.0–15.0)
Immature Granulocytes: 0 %
Lymphocytes Relative: 13 %
Lymphs Abs: 1 10*3/uL (ref 0.7–4.0)
MCH: 30.7 pg (ref 26.0–34.0)
MCHC: 32.3 g/dL (ref 30.0–36.0)
MCV: 95.1 fL (ref 80.0–100.0)
Monocytes Absolute: 0.7 10*3/uL (ref 0.1–1.0)
Monocytes Relative: 9 %
Neutro Abs: 6.3 10*3/uL (ref 1.7–7.7)
Neutrophils Relative %: 77 %
Platelets: 161 10*3/uL (ref 150–400)
RBC: 4.69 MIL/uL (ref 3.87–5.11)
RDW: 13 % (ref 11.5–15.5)
WBC: 8.2 10*3/uL (ref 4.0–10.5)
nRBC: 0 % (ref 0.0–0.2)

## 2023-06-13 MED ORDER — CEPHALEXIN 500 MG PO CAPS: 500.0000 mg | ORAL_CAPSULE | Freq: Two times a day (BID) | ORAL | 0 refills | Status: DC

## 2023-06-13 MED ORDER — POTASSIUM CHLORIDE CRYS ER 20 MEQ PO TBCR
40.0000 meq | EXTENDED_RELEASE_TABLET | Freq: Once | ORAL | Status: AC
  Administered 2023-06-13: 40 meq via ORAL
  Filled 2023-06-13: qty 2

## 2023-06-13 MED ORDER — CEPHALEXIN 500 MG PO CAPS
500.0000 mg | ORAL_CAPSULE | Freq: Once | ORAL | Status: AC
  Administered 2023-06-13: 500 mg via ORAL
  Filled 2023-06-13: qty 1

## 2023-06-13 NOTE — Discharge Instructions (Signed)
Please take the antibiotics as prescribed.  Follow-up with your neurologist this week as scheduled.  Return to the ER for worsening symptoms.
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
## 2023-06-14 ENCOUNTER — Telehealth: Payer: Self-pay

## 2023-06-14 NOTE — Telephone Encounter (Signed)
Called daughter to follow up from ED visit. She states the patient is having a better day to day. States home health was referred but then cancelled. The patient is going to a day program, so this may be the reason why home health is not seeing her ( she has to be homebound). They will speak to social worker in the PCP office on Tuesday.

## 2023-06-17 ENCOUNTER — Ambulatory Visit: Payer: PPO | Admitting: Family Medicine

## 2023-06-17 ENCOUNTER — Emergency Department (HOSPITAL_COMMUNITY): Payer: PPO

## 2023-06-17 ENCOUNTER — Emergency Department (HOSPITAL_COMMUNITY)
Admission: EM | Admit: 2023-06-17 | Discharge: 2023-06-17 | Disposition: A | Discharge status: Home / Self Care | Payer: PPO | Attending: Emergency Medicine | Admitting: Emergency Medicine

## 2023-06-17 ENCOUNTER — Other Ambulatory Visit: Payer: Self-pay

## 2023-06-17 DIAGNOSIS — K449 Diaphragmatic hernia without obstruction or gangrene: Secondary | ICD-10-CM | POA: Diagnosis not present

## 2023-06-17 DIAGNOSIS — I10 Essential (primary) hypertension: Secondary | ICD-10-CM | POA: Insufficient documentation

## 2023-06-17 DIAGNOSIS — N390 Urinary tract infection, site not specified: Secondary | ICD-10-CM | POA: Insufficient documentation

## 2023-06-17 DIAGNOSIS — R197 Diarrhea, unspecified: Secondary | ICD-10-CM | POA: Diagnosis present

## 2023-06-17 DIAGNOSIS — D259 Leiomyoma of uterus, unspecified: Secondary | ICD-10-CM | POA: Diagnosis not present

## 2023-06-17 DIAGNOSIS — K5641 Fecal impaction: Secondary | ICD-10-CM | POA: Diagnosis not present

## 2023-06-17 DIAGNOSIS — R1030 Lower abdominal pain, unspecified: Secondary | ICD-10-CM | POA: Diagnosis not present

## 2023-06-17 LAB — URINALYSIS, ROUTINE W REFLEX MICROSCOPIC
Glucose, UA: NEGATIVE mg/dL
Ketones, ur: NEGATIVE mg/dL
Nitrite: NEGATIVE
Protein, ur: 100 mg/dL — AB
Specific Gravity, Urine: 1.029 (ref 1.005–1.030)
WBC, UA: >50 WBC/hpf (ref 0–5)
pH: 5 (ref 5.0–8.0)

## 2023-06-17 LAB — COMPREHENSIVE METABOLIC PANEL
ALT: 11 U/L (ref 0–44)
AST: 15 U/L (ref 15–41)
Albumin: 3.7 g/dL (ref 3.5–5.0)
Alkaline Phosphatase: 61 U/L (ref 38–126)
Anion gap: 9 (ref 5–15)
BUN: 15 mg/dL (ref 8–23)
CO2: 27 mmol/L (ref 22–32)
Calcium: 8.9 mg/dL (ref 8.9–10.3)
Chloride: 103 mmol/L (ref 98–111)
Creatinine, Ser: 0.66 mg/dL (ref 0.44–1.00)
GFR, Estimated: >60 mL/min (ref >60)
Glucose, Bld: 103 mg/dL — ABNORMAL HIGH (ref 70–99)
Potassium: 3.4 mmol/L — ABNORMAL LOW (ref 3.5–5.1)
Sodium: 139 mmol/L (ref 135–145)
Total Bilirubin: 0.5 mg/dL (ref 0.3–1.2)
Total Protein: 6.8 g/dL (ref 6.5–8.1)

## 2023-06-17 LAB — CBC
HCT: 41 % (ref 36.0–46.0)
Hemoglobin: 13.3 g/dL (ref 12.0–15.0)
MCH: 31.4 pg (ref 26.0–34.0)
MCHC: 32.4 g/dL (ref 30.0–36.0)
MCV: 96.7 fL (ref 80.0–100.0)
Platelets: 190 10*3/uL (ref 150–400)
RBC: 4.24 MIL/uL (ref 3.87–5.11)
RDW: 12.7 % (ref 11.5–15.5)
WBC: 7.3 10*3/uL (ref 4.0–10.5)
nRBC: 0 % (ref 0.0–0.2)

## 2023-06-17 LAB — LIPASE, BLOOD: Lipase: 32 U/L (ref 11–51)

## 2023-06-17 MED ORDER — LACTATED RINGERS IV BOLUS
1000.0000 mL | Freq: Once | INTRAVENOUS | Status: AC
  Administered 2023-06-17: 1000 mL via INTRAVENOUS

## 2023-06-17 MED ORDER — IOHEXOL 300 MG/ML  SOLN
100.0000 mL | Freq: Once | INTRAMUSCULAR | Status: AC | PRN
  Administered 2023-06-17: 100 mL via INTRAVENOUS

## 2023-06-17 NOTE — ED Triage Notes (Signed)
Pt daughter reports diarrhea x 3 days. Pt was started on antibiotics for UTI Sunday. Also c/o lower abd pain

## 2023-06-17 NOTE — Discharge Instructions (Addendum)
Your presentation is not consistent with C. difficile.  It is very unlikely that you have this infection based on today's presentation.  It is more consistent with a stool impaction.   What medicine should I take?    Take Miralax, a powder that you mix in a clear liquid.    Follow these steps:    ?Stir the Miralax powder into water, juice, or Gatorade. Your Miralax dose is:   8 capfuls of Miralax powder in 32 to 64 ounces of liquid    ?Drink 4 to 8 ounces every 30 minutes. It will take 4 to 6 hours for you to finish the medicine.   ?After the medicine is gone, drink more water or juice. This will help with the cleanout.    If the medicine gives your child an upset stomach, slow down or stop.    Do I need to keep taking medicine?    After the clean out, you will take a daily (maintenance) medicine for at least 6 months.    Your Miralax dose after the cleanout is complete will be:    1 capful of powder in 8 ounces of liquid every day

## 2023-06-17 NOTE — ED Provider Notes (Signed)
[place] [facility]ERGENCY DEPARTMENT AT [facility] Provider Note   CSN: [id number] Arrival date & time: 06/17/23  1400     History Chief Complaint  Patient presents with   Diarrhea    HPI Darlene Ballard is a 77 y.o. female presenting for acute diarrheal illness.  She is a 77 year old female with a expansive medical history including cognitive impairment, hyperlipidemia, hypertension.  She is brought in by her daughter for further care and management. Was brought in on Saturday for a urinary tract infection and altered mental status.  Started on Keflex and had substantial improvement over the past 3 days.  However approximately 36 hours after the first dose of Keflex, patient started having a substantial amount of bowel movements.  Roughly 20/day. They are watery nonbloody nonbilious. No history of similar.  Patient endorses pain with bowel movements.  She is demented and unable to provide much history on her own. She does go to an elder daycare. Patient's recorded medical, surgical, social, medication list and allergies were reviewed in the Snapshot window as part of the initial history.   Review of Systems   Review of Systems  Constitutional:  Negative for chills and fever.  HENT:  Negative for ear pain and sore throat.   Eyes:  Negative for pain and visual disturbance.  Respiratory:  Negative for cough and shortness of breath.   Cardiovascular:  Negative for chest pain and palpitations.  Gastrointestinal:  Positive for diarrhea. Negative for abdominal pain and vomiting.  Genitourinary:  Negative for dysuria and hematuria.  Musculoskeletal:  Negative for arthralgias and back pain.  Skin:  Negative for color change and rash.  Neurological:  Negative for seizures and syncope.  All other systems reviewed and are negative.   Physical Exam Updated Vital Signs BP (!) 152/104   Pulse 68   Temp 98.3 F (36.8 C) (Oral)   Resp 15   Ht 5\' 4"  (1.626 m)   Wt 62 kg   SpO2 100%    BMI 23.46 kg/m  Physical Exam Vitals and nursing note reviewed.  Constitutional:      General: She is not in acute distress.    Appearance: She is well-developed.  HENT:     Head: Normocephalic and atraumatic.  Eyes:     Conjunctiva/sclera: Conjunctivae normal.  Cardiovascular:     Rate and Rhythm: Normal rate and regular rhythm.     Heart sounds: No murmur heard. Pulmonary:     Effort: Pulmonary effort is normal. No respiratory distress.     Breath sounds: Normal breath sounds.  Abdominal:     General: There is no distension.     Palpations: Abdomen is soft.     Tenderness: There is no abdominal tenderness. There is no right CVA tenderness or left CVA tenderness.  Musculoskeletal:        General: No swelling or tenderness. Normal range of motion.     Cervical back: Neck supple.  Skin:    General: Skin is warm and dry.  Neurological:     General: No focal deficit present.     Mental Status: She is alert and oriented to person, place, and time. Mental status is at baseline.     Cranial Nerves: No cranial nerve deficit.      ED Course/ Medical Decision Making/ A&P    Procedures Procedures   Medications Ordered in ED Medications  lactated ringers bolus 1,000 mL (1,000 mLs Intravenous New Bag/Given 06/17/23 1911)  iohexol (OMNIPAQUE) 300 MG/ML  solution 100 mL (100 mLs Intravenous Contrast Given 06/17/23 1835)    Medical Decision Making:    [name] is a 77 y.o. female who presented to the ED today with diarrheal illness detailed above.     Additional history discussed with patient's family/caregivers.  Patient placed on continuous vitals and telemetry monitoring while in ED which was reviewed periodically.   Complete initial physical exam performed, notably the patient  was hemodynamically stable no acute distress.  No acute abdominal tenderness.      Reviewed and confirmed nursing documentation for past medical history, family history, social history.     Initial Assessment:   With the patient's presentation of diarrheal illness after antibiotics, most likely diagnosis is C. difficile versus pharmaceutical side effect. Other diagnoses were considered including (but not limited to) alternative gastrointestinal pathogen such as E. coli or viral etiology causing a gastroenteritis episode.  Additionally considered appendicitis, cholecystitis, small bowel obstruction though these are considered less likely due to history of present illness and physical exam findings.   This is most consistent with an acute life/limb threatening illness complicated by underlying chronic conditions.  Initial Plan:  Stool pathogen panel.  If unable to collect to evaluate for toxic megacolon will proceed to CT abdomen pelvis with contrast Screening labs including CBC and Metabolic panel to evaluate for infectious or metabolic etiology of disease.  Urinalysis with reflex culture ordered to evaluate for UTI or relevant urologic/nephrologic pathology.  Will send the sample for culture Patient appears clinically dehydrated will treat with IV fluids Objective evaluation as below reviewed with plan for close reassessment  Initial Study Results:   Laboratory  All laboratory results reviewed without evidence of clinically relevant pathology.   Exceptions include: Hypokalemia improving, recommend they continue the treatment in the outpatient setting.  Urinary tract infection does not seem to be improving, will send off for culture while continuing the Keflex dosing.  Is only been administered 3 times since patient's discharge on Saturday.  Radiology  All images reviewed independently. Agree with radiology report at this time.   CT ABDOMEN PELVIS W CONTRAST  Result Date: 06/17/2023 CLINICAL DATA:  Acute nonlocalized abdominal pain. Diarrhea for 3 days. Patient began antibiotics for urinary tract infection on Sunday. Lower abdominal pain. EXAM: CT ABDOMEN AND PELVIS WITH CONTRAST  TECHNIQUE: Multidetector CT imaging of the abdomen and pelvis was performed using the standard protocol following bolus administration of intravenous contrast. RADIATION DOSE REDUCTION: This exam was performed according to the departmental dose-optimization program which includes automated exposure control, adjustment of the mA and/or kV according to patient size and/or use of iterative reconstruction technique. CONTRAST:  OMNIPAQUE IOHEXOL 300 MG/ML  SOLN COMPARISON:  None Available. FINDINGS: Lower chest: Small left pleural effusion with infiltration or atelectasis in the left base. This may be compressive atelectasis or pneumonia. Small pericardial effusion. Moderate-sized esophageal hiatal hernia. Hepatobiliary: No focal liver abnormality is seen. No gallstones, gallbladder wall thickening, or biliary dilatation. Pancreas: Unremarkable. No pancreatic ductal dilatation or surrounding inflammatory changes. Spleen: Normal in size without focal abnormality. Adrenals/Urinary Tract: Adrenal glands are unremarkable. Kidneys are normal, without renal calculi, focal lesion, or hydronephrosis. Bladder is unremarkable. Stomach/Bowel: Stomach, small bowel, and colon are not abnormally distended. Scattered stool in the colon. Moderately prominent stool in the rectum with some rectal wall thickening possibly indicating stercoral colitis or proctitis. Appendix is normal. Vascular/Lymphatic: Aortic atherosclerosis. No enlarged abdominal or pelvic lymph nodes. Reproductive: Uterus and ovaries are not enlarged. Calcifications in the  uterus consistent with fibroids. Other: No free air or free fluid in the abdomen. Abdominal wall musculature appears intact. Musculoskeletal: Degenerative changes in the spine. Spondylolysis with mild spondylolisthesis at L5-S1. No acute bony abnormalities. IMPRESSION: 1. Left pleural effusion with basilar atelectasis or consolidation. This may indicate pneumonia. 2. Moderate-sized esophageal  hiatal hernia. 3. Prominent stool in the rectum with some rectal wall thickening suggesting possible stercoral colitis or proctitis. No bowel obstruction. 4. Calcified uterine fibroids. 5. Aortic atherosclerosis. Electronically Signed   By: Burman Nieves M.D.   On: 06/17/2023 19:25      Reassessment and Plan:   CT abdomen pelvis with contrast did not show toxic megacolon but rather showed prominent rectal stool. Discussed physical versus chemical disimpaction with family they would rather proceed with MiraLAX trial with plan for 3-6 doses tomorrow and doubling every day until patient has substantial bowel movement and improvement. Etiology of her developing stool impaction is likely recent use of loperamide. Labs without acute evidence of infection.  Unlikely to be C. difficile for GI path at this time.  Will treat empirically and recommend close follow-up with PCP within 72 hours. Disposition:  I have considered need for hospitalization, however, considering all of the above, I believe this patient is stable for discharge at this time.  Patient/family educated about specific return precautions for given chief complaint and symptoms.  Patient/family educated about follow-up with PCP.     Patient/family expressed understanding of return precautions and need for follow-up. Patient spoken to regarding all imaging and laboratory results and appropriate follow up for these results. All education provided in verbal form with additional information in written form. Time was allowed for answering of patient questions. Patient discharged.    Emergency Department Medication Summary:   Medications  lactated ringers bolus 1,000 mL (1,000 mLs Intravenous New Bag/Given 06/17/23 1911)  iohexol (OMNIPAQUE) 300 MG/ML solution 100 mL (100 mLs Intravenous Contrast Given 06/17/23 1835)      Clinical Impression:  1. Fecal impaction in rectum [facility])      Discharge   Final Clinical Impression(s) / ED  Diagnoses Final diagnoses:  Fecal impaction in rectum [facility])    Rx / DC Orders ED Discharge Orders     None         Glyn Ade, MD 06/17/23 1958

## 2023-06-17 NOTE — ED Notes (Signed)
Patient thought she had a bowel movement but didn't    she had just a small smear on her pull up

## 2023-06-18 LAB — URINE CULTURE: Culture: <10000 — AB

## 2023-06-19 ENCOUNTER — Telehealth: Payer: Self-pay

## 2023-06-19 ENCOUNTER — Other Ambulatory Visit: Payer: Self-pay | Admitting: Internal Medicine

## 2023-06-19 ENCOUNTER — Ambulatory Visit: Payer: PPO | Admitting: Physician Assistant

## 2023-06-19 NOTE — Telephone Encounter (Signed)
Transition Care Management Unsuccessful Follow-up Telephone Call  Date of discharge and from where:  [facility]  Attempts:  1st Attempt  Reason for unsuccessful TCM follow-up call:  No answer/busy   Lenard Forth Corozal  [facility], [facility] Guide, Phone: [phone] Website: [email or web address]

## 2023-06-19 NOTE — Telephone Encounter (Signed)
Transition Care Management Follow-up Telephone Call Date of discharge and from where: [facility] How have you been since you were released from the hospital? Doing Better and following up with Providers Any questions or concerns? No  Items Reviewed: Did the pt receive and understand the discharge instructions provided? Yes  Medications obtained and verified? Yes  Other? No  Any new allergies since your discharge? No  Dietary orders reviewed? No Do you have support at home? Yes     Follow up appointments reviewed:  PCP Hospital f/u appt confirmed? Yes  Scheduled to see  on  @ . Specialist Hospital f/u appt confirmed? Yes  Scheduled to see  on  @ . Are transportation arrangements needed? No  If their condition worsens, is the pt aware to call PCP or go to the Emergency Dept.? Yes Was the patient provided with contact information for the PCP's office or ED? Yes Was to pt encouraged to call back with questions or concerns? Yes

## 2023-06-20 ENCOUNTER — Other Ambulatory Visit: Payer: Self-pay | Admitting: Medical

## 2023-07-01 ENCOUNTER — Ambulatory Visit: Payer: PPO | Admitting: Urology

## 2023-07-01 ENCOUNTER — Encounter: Payer: Self-pay | Admitting: Urology

## 2023-07-01 VITALS — BP 156/89 | HR 77

## 2023-07-01 DIAGNOSIS — R399 Unspecified symptoms and signs involving the genitourinary system: Secondary | ICD-10-CM

## 2023-07-01 LAB — URINALYSIS, ROUTINE W REFLEX MICROSCOPIC
Bilirubin, UA: NEGATIVE
Glucose, UA: NEGATIVE
Ketones, UA: NEGATIVE
Nitrite, UA: NEGATIVE
Protein,UA: NEGATIVE
RBC, UA: NEGATIVE
Specific Gravity, UA: 1.02 (ref 1.005–1.030)
Urobilinogen, Ur: 1 mg/dL (ref 0.2–1.0)
pH, UA: 6 (ref 5.0–7.5)

## 2023-07-01 LAB — MICROSCOPIC EXAMINATION

## 2023-07-01 NOTE — Progress Notes (Addendum)
   Assessment: 1. Lower urinary tract symptoms (LUTS)     Plan: Today I discussed with the patient's caregiver that I do not think we can attribute her recent mental status changes to an obvious UTI.  Will send urine for culture today.  Patient will follow-up as needed.   Addendum: Drop off urine 07/23/2023 grew pansensitive E. coli Prescription sent in for Keflex 500 3 times daily for 5 days  Chief Complaint: Lower urinary tract symptoms  HPI: Darlene Ballard is a 77 y.o. female who presents for continued evaluation of LUTS. Please see my note 05/05/2023 at the time of initial visit for detailed history. Patient has severe and worsening dementia. She recently had a quite significant worsening mental status and combativeness and was taken to the emergency room on 06/13/2023.  They were concerned that she might of had a UTI making situation worse.  Her urine at that time however looked quite good with only a trace of leuks and rare bacteria.  She was nonetheless started on Keflex.  She represented to the emergency department on 06/17/2023.  Culture at that time was negative.  Since that time she has done better from a mental status standpoint and presents here today for follow-up.  Portions of the above documentation were copied from a prior visit for review purposes only.  Allergies: Allergies  Allergen Reactions   Yellow Jacket Venom Anaphylaxis and Swelling   Paxil [Paroxetine] Other (See Comments)    "Anger issues"   Zoloft [Sertraline] Other (See Comments)    "Anger issues"    PMH: Past Medical History:  Diagnosis Date   Memory loss     PSH: Past Surgical History:  Procedure Laterality Date   TONSILLECTOMY AND ADENOIDECTOMY      SH: Social History   Tobacco Use   Smoking status: Never   Smokeless tobacco: Never  Vaping Use   Vaping status: Never Used  Substance Use Topics   Alcohol use: Never   Drug use: Never    ROS: Constitutional:  Negative for fever,  chills, weight loss CV: Negative for chest pain, previous MI, hypertension Respiratory:  Negative for shortness of breath, wheezing, sleep apnea, frequent cough GI:  Negative for nausea, vomiting, bloody stool, GERD  PE: BP (!) 156/89   Pulse 77  GENERAL APPEARANCE:  Well appearing, well developed, well nourished, NAD HEENT:  Atraumatic, normocephalic, oropharynx clear NECK:  Supple without lymphadenopathy or thyromegaly ABDOMEN:  Soft, non-tender, no masses EXTREMITIES:  Moves all extremities well, without clubbing, cyanosis, or edema NEUROLOGIC:  Alert and oriented x 3, normal gait, CN II-XII grossly intact MENTAL STATUS:  appropriate BACK:  Non-tender to palpation, No CVAT SKIN:  Warm, dry, and intact

## 2023-07-02 ENCOUNTER — Ambulatory Visit (INDEPENDENT_AMBULATORY_CARE_PROVIDER_SITE_OTHER): Payer: PPO | Admitting: Medical

## 2023-07-02 ENCOUNTER — Encounter: Payer: Self-pay | Admitting: Medical

## 2023-07-02 DIAGNOSIS — Z741 Need for assistance with personal care: Secondary | ICD-10-CM | POA: Diagnosis not present

## 2023-07-02 DIAGNOSIS — L603 Nail dystrophy: Secondary | ICD-10-CM | POA: Diagnosis not present

## 2023-07-02 DIAGNOSIS — F919 Conduct disorder, unspecified: Secondary | ICD-10-CM

## 2023-07-02 DIAGNOSIS — F0390 Unspecified dementia without behavioral disturbance: Secondary | ICD-10-CM

## 2023-07-02 DIAGNOSIS — G8929 Other chronic pain: Secondary | ICD-10-CM | POA: Diagnosis not present

## 2023-07-02 DIAGNOSIS — M25561 Pain in right knee: Secondary | ICD-10-CM

## 2023-07-02 DIAGNOSIS — F419 Anxiety disorder, unspecified: Secondary | ICD-10-CM | POA: Diagnosis not present

## 2023-07-02 DIAGNOSIS — E039 Hypothyroidism, unspecified: Secondary | ICD-10-CM

## 2023-07-02 DIAGNOSIS — G479 Sleep disorder, unspecified: Secondary | ICD-10-CM

## 2023-07-02 DIAGNOSIS — M25562 Pain in left knee: Secondary | ICD-10-CM | POA: Diagnosis not present

## 2023-07-02 LAB — URINE CULTURE

## 2023-07-02 MED ORDER — MELATONIN 5 MG PO TABS: 5.0000 mg | ORAL_TABLET | Freq: Every day | ORAL | Status: DC

## 2023-07-02 NOTE — Progress Notes (Signed)
Subjective:  Darlene Ballard is a 77 y.o. female who presents for Chief Complaint  Patient presents with   Consult    Daughter Darlene Ballard, is wanting to place patient somewhere with Skilled nursing memory care due to her memory.      Daughter Darlene Ballard here today to discuss her mother, Darlene Ballard's care.  Darlene Ballard has been talking with elder law to get some guidance.  Currently Darlene Ballard is still living with Darlene Ballard and they are taking her to a day program every day which is working out okay, but she realizes she needs more care and more round-the-clock supervision  After speaking with the attorney, she is looking to place her mother in a skilled nursing facility memory care unit.  She needs an FL 2 completed while she looks around and compares different facilities in the area  She is compliant with her thyroid medicine daily.  She is compliant with fluoxetine Prozac.   Darlene Ballard discontinued olanzapine as she would be up all night if she took it at night or if she took it during the day she would be irritable.  States she felt like it was doing worse than when not on the medication.  Lately they have been using melatonin at night 5 mg which seems to be helping her sleep through the night.  She recently saw podiatry and had some nail care.  At times Dejanique can be verbally abusive to people including family and people at the day program  Darlene Ballard is worried as the seasons change closer to went to her that she may start to have sundowning.  No other new issues.  No other aggravating or relieving factors.    No other c/o.  Past Medical History:  Diagnosis Date   Anxiety    Chronic knee pain    Dementia (HCC)    Hypothyroidism    Onychodystrophy    Sleep disturbance    Current Outpatient Medications on File Prior to Visit  Medication Sig Dispense Refill   FLUoxetine (PROZAC) 10 MG capsule TAKE 1 CAPSULE (10 MG TOTAL) BY MOUTH DAILY. 30 capsule 1   levothyroxine (SYNTHROID) 88 MCG tablet Take 1 tablet  (88 mcg total) by mouth daily. (Patient taking differently: Take 88 mcg by mouth daily before breakfast.) 30 tablet 1   No current facility-administered medications on file prior to visit.     The following portions of the patient's history were reviewed and updated as appropriate: allergies, current medications, past family history, past medical history, past social history, past surgical history and problem list.  ROS Otherwise as in subjective above    Objective: Patient not examined    Assessment: Encounter Diagnoses  Name Primary?   Anxiety Yes   Dementia without behavioral disturbance (HCC)    Chronic pain of both knees    Hypothyroidism, unspecified type    Self-care deficit for bathing and hygiene    Onychodystrophy    Sleep disturbance    Behavior disturbance      Plan: We discussed concerns, discussed the process to trying to get her mother into skilled nursing facility for memory unit.  FL 2 completed today.  Advise she have mother see eye doctor soon to get updated prescription lenses as she may need that.  For now continue melatonin.  She may have not been tolerating olanzapine all that well.  Continue fluoxetine Prozac.  Hypothyroidism-continue levothyroxine as usual  Since she had a recent urinary tract issue, advise maybe repeating the urine in another couple weeks  to make sure no signs of infection  Oleta was seen today for consult.  Diagnoses and all orders for this visit:  Anxiety  Dementia without behavioral disturbance (HCC)  Chronic pain of both knees  Hypothyroidism, unspecified type  Self-care deficit for bathing and hygiene  Onychodystrophy  Sleep disturbance  Behavior disturbance  Other orders -     melatonin 5 MG TABS; Take 1 tablet (5 mg total) by mouth at bedtime.  Spent > 45 minutes face to face with patient in discussion of symptoms, evaluation, plan and recommendations.    Follow up: 72mo

## 2023-07-07 ENCOUNTER — Other Ambulatory Visit: Payer: Self-pay | Admitting: Medical

## 2023-07-07 ENCOUNTER — Telehealth: Payer: Self-pay

## 2023-07-07 NOTE — Telephone Encounter (Signed)
Transition Care Management Follow-up Telephone Call Date of discharge and from where: [facility] How have you been since you were released from the hospital? Doing good and following up with urology Any questions or concerns? No  Items Reviewed: Did the pt receive and understand the discharge instructions provided? Yes  Medications obtained and verified? No  Other? No  Any new allergies since your discharge? No  Dietary orders reviewed? No Do you have support at home? Yes    Follow up appointments reviewed:  PCP Hospital f/u appt confirmed? No  Scheduled to see  on  @ . Specialist Hospital f/u appt confirmed? Yes  Scheduled to see urology on 9/18 @ . Are transportation arrangements needed? No  If their condition worsens, is the pt aware to call PCP or go to the Emergency Dept.? Yes Was the patient provided with contact information for the PCP's office or ED? Yes Was to pt encouraged to call back with questions or concerns? Yes

## 2023-07-09 ENCOUNTER — Telehealth: Payer: Self-pay

## 2023-07-09 NOTE — Telephone Encounter (Signed)
Transition Care Management Unsuccessful Follow-up Telephone Call  Date of discharge and from where:  [facility]  Attempts:  2nd Attempt  Reason for unsuccessful TCM follow-up call:  No answer/busy   Darlene Ballard  [facility], [facility] Guide, Phone: [phone] Website: [email or web address]

## 2023-07-10 ENCOUNTER — Encounter: Payer: Self-pay | Admitting: Physician Assistant

## 2023-07-10 ENCOUNTER — Ambulatory Visit: Payer: PPO | Admitting: Physician Assistant

## 2023-07-10 VITALS — BP 121/75 | HR 78 | Ht 65.0 in | Wt 123.6 lb

## 2023-07-10 DIAGNOSIS — G301 Alzheimer's disease with late onset: Secondary | ICD-10-CM | POA: Diagnosis not present

## 2023-07-10 DIAGNOSIS — F02818 Dementia in other diseases classified elsewhere, unspecified severity, with other behavioral disturbance: Secondary | ICD-10-CM | POA: Diagnosis not present
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
## 2023-07-10 NOTE — Patient Instructions (Addendum)
It was a pleasure to see you today at our office.   Recommendations:  Follow up in 6 months Agree with  24/7 care  Continue Adult Day Program  Continue Prozac  as directed   Whom to call:  Memory  decline, memory medications: Call our office [phone]   For psychiatric meds, mood meds: Please have your primary care physician manage these medications.      For assessment of decision of mental capacity and competency:  Call Dr. Erick Blinks, geriatric psychiatrist at [phone]  For guidance in geriatric dementia issues please call [facility] [phone]  Call [facility] [email or web address]    If you have any severe symptoms of a stroke, or other severe issues such as confusion,severe chills or fever, etc call 911 or go to the ER as you may need to be evaluated further     RECOMMENDATIONS FOR ALL PATIENTS WITH MEMORY PROBLEMS: 1. Continue to exercise (Recommend 30 minutes of walking everyday, or 3 hours every week) 2. Increase social interactions - continue going to [place] and enjoy social gatherings with friends and family 3. Eat healthy, avoid fried foods and eat more fruits and vegetables 4. Maintain adequate blood pressure, blood sugar, and blood cholesterol level. Reducing the risk of stroke and cardiovascular disease also helps promoting better memory. 5. Avoid stressful situations. Live a simple life and avoid aggravations. Organize your time and prepare for the next day in anticipation. 6. Sleep well, avoid any interruptions of sleep and avoid any distractions in the bedroom that may interfere with adequate sleep quality 7. Avoid sugar, avoid sweets as there is a strong link between excessive sugar intake, diabetes, and cognitive impairment We discussed the Mediterranean diet, which has been shown to help patients reduce the risk of progressive memory disorders and reduces cardiovascular risk. This includes eating fish,  eat fruits and green leafy vegetables, nuts like almonds and hazelnuts, walnuts, and also use olive oil. Avoid fast foods and fried foods as much as possible. Avoid sweets and sugar as sugar use has been linked to worsening of memory function.  There is always a concern of gradual progression of memory problems. If this is the case, then we may need to adjust level of care according to patient needs. Support, both to the patient and caregiver, should then be put into place.    The Alzheimer's Association is here all day, every day for people facing Alzheimer's disease through our free 24/7 Helpline: [phone]. The Helpline provides reliable information and support to all those who need assistance, such as individuals living with memory loss, Alzheimer's or other dementia, caregivers, health care professionals and the public.  Our highly trained and knowledgeable staff can help you with: Understanding memory loss, dementia and Alzheimer's  Medications and other treatment options  General information about aging and brain health  Skills to provide quality care and to find the best care from professionals  Legal, financial and living-arrangement decisions Our Helpline also features: Confidential care consultation provided by master's level clinicians who can help with decision-making support, crisis assistance and education on issues families face every day  Help in a caller's preferred language using our translation service that features more than 200 languages and dialects  Referrals to local community programs, services and ongoing support     FALL PRECAUTIONS: Be cautious when walking. Scan the area for obstacles that may increase the risk of trips and falls. When getting up in the mornings, sit up at the edge of  the bed for a few minutes before getting out of bed. Consider elevating the bed at the head end to avoid drop of blood pressure when getting up. Walk always in a well-lit room (use  night lights in the walls). Avoid area rugs or power cords from appliances in the middle of the walkways. Use a walker or a cane if necessary and consider physical therapy for balance exercise. Get your eyesight checked regularly.  FINANCIAL OVERSIGHT: Supervision, especially oversight when making financial decisions or transactions is also recommended.  HOME SAFETY: Consider the safety of the kitchen when operating appliances like stoves, microwave oven, and blender. Consider having supervision and share cooking responsibilities until no longer able to participate in those. Accidents with firearms and other hazards in the house should be identified and addressed as well.   ABILITY TO BE LEFT ALONE: If patient is unable to contact 911 operator, consider using LifeLine, or when the need is there, arrange for someone to stay with patients. Smoking is a fire hazard, consider supervision or cessation. Risk of wandering should be assessed by caregiver and if detected at any point, supervision and safe proof recommendations should be instituted.  MEDICATION SUPERVISION: Inability to self-administer medication needs to be constantly addressed. Implement a mechanism to ensure safe administration of the medications.          Mediterranean Diet A Mediterranean diet refers to food and lifestyle choices that are based on the traditions of countries located on the [employer]. This way of eating has been shown to help prevent certain conditions and improve outcomes for people who have chronic diseases, like kidney disease and heart disease. What are tips for following this plan? Lifestyle  Cook and eat meals together with your family, when possible. Drink enough fluid to keep your urine clear or pale yellow. Be physically active every day. This includes: Aerobic exercise like running or swimming. Leisure activities like gardening, walking, or housework. Get 7-8 hours of sleep each night. If  recommended by your health care provider, drink red wine in moderation. This means 1 glass a day for nonpregnant women and 2 glasses a day for men. A glass of wine equals 5 oz (150 mL). Reading food labels  Check the serving size of packaged foods. For foods such as rice and pasta, the serving size refers to the amount of cooked product, not dry. Check the total fat in packaged foods. Avoid foods that have saturated fat or trans fats. Check the ingredients list for added sugars, such as corn syrup. Shopping  At the grocery store, buy most of your food from the areas near the walls of the store. This includes: Fresh fruits and vegetables (produce). Grains, beans, nuts, and seeds. Some of these may be available in unpackaged forms or large amounts (in bulk). Fresh seafood. Poultry and eggs. Low-fat dairy products. Buy whole ingredients instead of prepackaged foods. Buy fresh fruits and vegetables in-season from local farmers markets. Buy frozen fruits and vegetables in resealable bags. If you do not have access to quality fresh seafood, buy precooked frozen shrimp or canned fish, such as tuna, salmon, or sardines. Buy small amounts of raw or cooked vegetables, salads, or olives from the deli or salad bar at your store. Stock your pantry so you always have certain foods on hand, such as olive oil, canned tuna, canned tomatoes, rice, pasta, and beans. Cooking  Cook foods with extra-virgin olive oil instead of using butter or other vegetable oils. Have meat as a  side dish, and have vegetables or grains as your main dish. This means having meat in small portions or adding small amounts of meat to foods like pasta or stew. Use beans or vegetables instead of meat in common dishes like chili or lasagna. Experiment with different cooking methods. Try roasting or broiling vegetables instead of steaming or sauteing them. Add frozen vegetables to soups, stews, pasta, or rice. Add nuts or seeds for added  healthy fat at each meal. You can add these to yogurt, salads, or vegetable dishes. Marinate fish or vegetables using olive oil, lemon juice, garlic, and fresh herbs. Meal planning  Plan to eat 1 vegetarian meal one day each week. Try to work up to 2 vegetarian meals, if possible. Eat seafood 2 or more times a week. Have healthy snacks readily available, such as: Vegetable sticks with hummus. Greek yogurt. Fruit and nut trail mix. Eat balanced meals throughout the week. This includes: Fruit: 2-3 servings a day Vegetables: 4-5 servings a day Low-fat dairy: 2 servings a day Fish, poultry, or lean meat: 1 serving a day Beans and legumes: 2 or more servings a week Nuts and seeds: 1-2 servings a day Whole grains: 6-8 servings a day Extra-virgin olive oil: 3-4 servings a day Limit red meat and sweets to only a few servings a month What are my food choices? Mediterranean diet Recommended Grains: Whole-grain pasta. Brown rice. Bulgar wheat. Polenta. Couscous. Whole-wheat bread. Orpah Cobb. Vegetables: Artichokes. Beets. Broccoli. Cabbage. Carrots. Eggplant. Green beans. Chard. Kale. Spinach. Onions. Leeks. Peas. Squash. Tomatoes. Peppers. Radishes. Fruits: Apples. Apricots. Avocado. Berries. Bananas. Cherries. Dates. Figs. Grapes. Lemons. Melon. Oranges. Peaches. Plums. Pomegranate. Meats and other protein foods: Beans. Almonds. Sunflower seeds. Pine nuts. Peanuts. Cod. Salmon. Scallops. Shrimp. Tuna. Tilapia. Clams. Oysters. Eggs. Dairy: Low-fat milk. Cheese. Greek yogurt. Beverages: Water. Red wine. Herbal tea. Fats and oils: Extra virgin olive oil. Avocado oil. Grape seed oil. Sweets and desserts: [place] yogurt with honey. Baked apples. Poached pears. Trail mix. Seasoning and other foods: Basil. Cilantro. Coriander. Cumin. Mint. Parsley. Sage. Rosemary. Tarragon. Garlic. Oregano. Thyme. Pepper. Balsalmic vinegar. Tahini. Hummus. Tomato sauce. Olives. Mushrooms. Limit these Grains:  Prepackaged pasta or rice dishes. Prepackaged cereal with added sugar. Vegetables: Deep fried potatoes (french fries). Fruits: Fruit canned in syrup. Meats and other protein foods: Beef. Pork. Lamb. Poultry with skin. Hot dogs. Tomasa Blase. Dairy: Ice cream. Sour cream. Whole milk. Beverages: Juice. Sugar-sweetened soft drinks. Beer. Liquor and spirits. Fats and oils: Butter. Canola oil. Vegetable oil. Beef fat (tallow). Lard. Sweets and desserts: Cookies. Cakes. Pies. Candy. Seasoning and other foods: Mayonnaise. Premade sauces and marinades. The items listed may not be a complete list. Talk with your dietitian about what dietary choices are right for you. Summary The Mediterranean diet includes both food and lifestyle choices. Eat a variety of fresh fruits and vegetables, beans, nuts, seeds, and whole grains. Limit the amount of red meat and sweets that you eat. Talk with your health care provider about whether it is safe for you to drink red wine in moderation. This means 1 glass a day for nonpregnant women and 2 glasses a day for men. A glass of wine equals 5 oz (150 mL). This information is not intended to replace advice given to you by your health care provider. Make sure you discuss any questions you have with your health care provider. Document Released: 05/22/2016 Document Revised: 06/24/2016 Document Reviewed: 05/22/2016 Elsevier Interactive Patient Education  2017 [employer].

## 2023-07-17 ENCOUNTER — Encounter: Payer: Self-pay | Admitting: Physician Assistant

## 2023-07-23 ENCOUNTER — Other Ambulatory Visit: Payer: PPO

## 2023-07-23 ENCOUNTER — Other Ambulatory Visit: Payer: Self-pay

## 2023-07-23 DIAGNOSIS — R399 Unspecified symptoms and signs involving the genitourinary system: Secondary | ICD-10-CM | POA: Diagnosis not present

## 2023-07-24 LAB — URINALYSIS, ROUTINE W REFLEX MICROSCOPIC
Bilirubin, UA: NEGATIVE
Glucose, UA: NEGATIVE
Ketones, UA: NEGATIVE
Nitrite, UA: NEGATIVE
Protein,UA: NEGATIVE
Specific Gravity, UA: 1.02 (ref 1.005–1.030)
Urobilinogen, Ur: 0.2 mg/dL (ref 0.2–1.0)
pH, UA: 7.5 (ref 5.0–7.5)

## 2023-07-24 LAB — MICROSCOPIC EXAMINATION
Renal Epithel, UA: >10 /[HPF] — AB
WBC, UA: >30 /[HPF] — AB (ref 0–5)

## 2023-07-27 ENCOUNTER — Other Ambulatory Visit: Payer: Self-pay | Admitting: Urology

## 2023-07-27 DIAGNOSIS — M1711 Unilateral primary osteoarthritis, right knee: Secondary | ICD-10-CM | POA: Diagnosis not present

## 2023-07-27 LAB — URINE CULTURE

## 2023-07-27 MED ORDER — CEPHALEXIN 500 MG PO CAPS: 500.0000 mg | ORAL_CAPSULE | Freq: Three times a day (TID) | ORAL | 0 refills | Status: DC

## 2023-07-28 ENCOUNTER — Encounter (HOSPITAL_COMMUNITY): Payer: Self-pay

## 2023-07-28 ENCOUNTER — Other Ambulatory Visit: Payer: Self-pay

## 2023-07-28 ENCOUNTER — Emergency Department (HOSPITAL_COMMUNITY): Payer: PPO

## 2023-07-28 ENCOUNTER — Emergency Department (HOSPITAL_COMMUNITY)
Admission: EM | Admit: 2023-07-28 | Discharge: 2023-07-28 | Disposition: A | Discharge status: Home / Self Care | Payer: PPO | Attending: Emergency Medicine | Admitting: Emergency Medicine

## 2023-07-28 DIAGNOSIS — R4182 Altered mental status, unspecified: Secondary | ICD-10-CM | POA: Diagnosis not present

## 2023-07-28 DIAGNOSIS — R456 Violent behavior: Secondary | ICD-10-CM | POA: Diagnosis not present

## 2023-07-28 DIAGNOSIS — N390 Urinary tract infection, site not specified: Secondary | ICD-10-CM | POA: Insufficient documentation

## 2023-07-28 DIAGNOSIS — R451 Restlessness and agitation: Secondary | ICD-10-CM | POA: Diagnosis not present

## 2023-07-28 DIAGNOSIS — R410 Disorientation, unspecified: Secondary | ICD-10-CM | POA: Insufficient documentation

## 2023-07-28 DIAGNOSIS — F0390 Unspecified dementia without behavioral disturbance: Secondary | ICD-10-CM | POA: Diagnosis not present

## 2023-07-28 DIAGNOSIS — F05 Delirium due to known physiological condition: Secondary | ICD-10-CM | POA: Insufficient documentation

## 2023-07-28 DIAGNOSIS — N3000 Acute cystitis without hematuria: Secondary | ICD-10-CM | POA: Insufficient documentation

## 2023-07-28 DIAGNOSIS — R1030 Lower abdominal pain, unspecified: Secondary | ICD-10-CM | POA: Diagnosis not present

## 2023-07-28 DIAGNOSIS — R1084 Generalized abdominal pain: Secondary | ICD-10-CM | POA: Diagnosis not present

## 2023-07-28 LAB — CBC WITH DIFFERENTIAL/PLATELET
Abs Immature Granulocytes: 0.01 10*3/uL (ref 0.00–0.07)
Basophils Absolute: 0 10*3/uL (ref 0.0–0.1)
Basophils Relative: 1 %
Eosinophils Absolute: 0.1 10*3/uL (ref 0.0–0.5)
Eosinophils Relative: 2 %
HCT: 41 % (ref 36.0–46.0)
Hemoglobin: 13.6 g/dL (ref 12.0–15.0)
Immature Granulocytes: 0 %
Lymphocytes Relative: 23 %
Lymphs Abs: 1.5 10*3/uL (ref 0.7–4.0)
MCH: 31.4 pg (ref 26.0–34.0)
MCHC: 33.2 g/dL (ref 30.0–36.0)
MCV: 94.7 fL (ref 80.0–100.0)
Monocytes Absolute: 0.4 10*3/uL (ref 0.1–1.0)
Monocytes Relative: 7 %
Neutro Abs: 4.2 10*3/uL (ref 1.7–7.7)
Neutrophils Relative %: 67 %
Platelets: 162 10*3/uL (ref 150–400)
RBC: 4.33 MIL/uL (ref 3.87–5.11)
RDW: 13.1 % (ref 11.5–15.5)
WBC: 6.3 10*3/uL (ref 4.0–10.5)
nRBC: 0 % (ref 0.0–0.2)

## 2023-07-28 LAB — URINALYSIS, W/ REFLEX TO CULTURE (INFECTION SUSPECTED)
Bacteria, UA: NONE SEEN
Bilirubin Urine: NEGATIVE
Glucose, UA: NEGATIVE mg/dL
Hgb urine dipstick: NEGATIVE
Ketones, ur: NEGATIVE mg/dL
Leukocytes,Ua: NEGATIVE
Nitrite: NEGATIVE
Protein, ur: NEGATIVE mg/dL
Specific Gravity, Urine: 1.016 (ref 1.005–1.030)
pH: 6 (ref 5.0–8.0)

## 2023-07-28 LAB — COMPREHENSIVE METABOLIC PANEL
ALT: 11 U/L (ref 0–44)
AST: 16 U/L (ref 15–41)
Albumin: 4 g/dL (ref 3.5–5.0)
Alkaline Phosphatase: 66 U/L (ref 38–126)
Anion gap: 6 (ref 5–15)
BUN: 17 mg/dL (ref 8–23)
CO2: 26 mmol/L (ref 22–32)
Calcium: 8.7 mg/dL — ABNORMAL LOW (ref 8.9–10.3)
Chloride: 104 mmol/L (ref 98–111)
Creatinine, Ser: 0.66 mg/dL (ref 0.44–1.00)
GFR, Estimated: >60 mL/min (ref >60)
Glucose, Bld: 101 mg/dL — ABNORMAL HIGH (ref 70–99)
Potassium: 3.2 mmol/L — ABNORMAL LOW (ref 3.5–5.1)
Sodium: 136 mmol/L (ref 135–145)
Total Bilirubin: 1 mg/dL (ref 0.3–1.2)
Total Protein: 6.6 g/dL (ref 6.5–8.1)

## 2023-07-28 MED ORDER — SODIUM CHLORIDE 0.9 % IV SOLN
1.0000 g | Freq: Once | INTRAVENOUS | Status: AC
  Administered 2023-07-28: 1 g via INTRAVENOUS
  Filled 2023-07-28: qty 10
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
## 2023-07-28 NOTE — Discharge Instructions (Signed)
Ms Darlene Ballard, Darlene Ballard were evaluated for altered mental status in the setting of a urinary tract infection. Fortunately, your evaluation did not show signs of an spreading or resistant infection. Most likely, your confusion was due to your UTI and will pass fully with time. You were given an extra dose of antibiotic here and you will need to continue your full course of keflex once you return home. Please return for return of sever confusion or agitation.

## 2023-07-28 NOTE — ED Provider Notes (Signed)
[place] EMERGENCY DEPARTMENT AT [facility] Provider Note   CSN: [id number] Arrival date & time: 07/28/23  1636     History  Chief Complaint  Patient presents with   Altered Mental Status   Urinary Tract Infection    Darlene Ballard is a 77 y.o. female.  Darlene Ballard is a 77 yo F with PMH advanced dementia and currently being treated for a UTI who was brought to [facility] ED by EMS due to altered mental status and aggression. The patient visited urgent care on the 10th and diagnosed with a UTI and culture results showed pan-sensitive E coli. Keflex TID treatment was started yesterday evening and continued today for three doses to this point. Last night, the patient was restless and wandered around the house she shares with her daughter. Today she went to adult daycare as usual and reported getting into fights with other people there (but this did not happen per pts daughter). Once the patient arrived home this afternoon, she became agitated by a dog gate which caused her to throw household objects, curse her daughter, and attempt to walk away from the home. At that point the patient's daughter called EMS. The patient is unable to provide history due to her acute state as well as underlying dementia. History provided by patient's daughter.   Altered Mental Status Urinary Tract Infection      Home Medications Prior to Admission medications   Medication Sig Start Date End Date Taking? Authorizing Provider  cephALEXin (KEFLEX) 500 MG capsule Take 1 capsule (500 mg total) by mouth 3 (three) times daily. 07/27/23   Joline Maxcy, MD  FLUoxetine (PROZAC) 10 MG capsule TAKE 1 CAPSULE (10 MG TOTAL) BY MOUTH DAILY. 06/22/23 06/21/24  Tysinger, Kermit Balo, PA-C  levothyroxine (SYNTHROID) 88 MCG tablet TAKE 1 TABLET (88 MCG TOTAL) BY MOUTH DAILY. 07/07/23 07/06/24  Tysinger, Kermit Balo, PA-C  melatonin 5 MG TABS Take 1 tablet (5 mg total) by mouth at bedtime. 07/02/23   Tysinger, Kermit Balo, PA-C       Allergies    Yellow jacket venom, Paxil [paroxetine], and Zoloft [sertraline]    Review of Systems   Review of Systems  Unable to perform ROS: Dementia    Physical Exam Updated Vital Signs BP 125/79   Pulse (!) 56   Temp 97.6 F (36.4 C) (Oral)   Resp 16   SpO2 95%  Physical Exam Constitutional:      General: She is not in acute distress.    Appearance: Normal appearance. She is not ill-appearing.  HENT:     Head: Normocephalic and atraumatic.  Cardiovascular:     Rate and Rhythm: Normal rate and regular rhythm.     Pulses: Normal pulses.  Pulmonary:     Effort: Pulmonary effort is normal.     Breath sounds: Normal breath sounds.  Abdominal:     General: Abdomen is flat.     Palpations: Abdomen is soft.     Tenderness: There is no abdominal tenderness. There is no right CVA tenderness, left CVA tenderness, guarding or rebound.  Musculoskeletal:     Right lower leg: No edema.     Left lower leg: No edema.  Neurological:     General: No focal deficit present.     Mental Status: She is alert. She is disoriented.     Cranial Nerves: No cranial nerve deficit.     Motor: No weakness.     Coordination: Coordination is intact.  Comments: Oriented to person only  Psychiatric:        Mood and Affect: Mood normal.        Speech: Speech is tangential.        Behavior: Behavior normal. Behavior is not agitated or aggressive. Behavior is cooperative.     Comments: Pt fluent but often nonsensical speech     ED Results / Procedures / Treatments   Labs (all labs ordered are listed, but only abnormal results are displayed) Labs Reviewed  COMPREHENSIVE METABOLIC PANEL - Abnormal; Notable for the following components:      Result Value   Potassium 3.2 (*)    Glucose, Bld 101 (*)    Calcium 8.7 (*)    All other components within normal limits  CBC WITH DIFFERENTIAL/PLATELET  URINALYSIS, W/ REFLEX TO CULTURE (INFECTION SUSPECTED)    EKG None  Radiology DG Chest  Port 1 View  Result Date: 07/28/2023 CLINICAL DATA:  Altered mental status EXAM: PORTABLE CHEST 1 VIEW COMPARISON:  Chest x-ray 01/15/2020 FINDINGS: The heart size and mediastinal contours are within normal limits. Both lungs are clear. The visualized skeletal structures are unremarkable. IMPRESSION: No active disease. Electronically Signed   By: Darliss Cheney M.D.   On: 07/28/2023 19:49    Procedures Procedures    Medications Ordered in ED Medications  cefTRIAXone (ROCEPHIN) 1 g in sodium chloride 0.9 % 100 mL IVPB (0 g Intravenous Stopped 07/28/23 1907)    ED Course/ Medical Decision Making/ A&P                                 Medical Decision Making [name] is a 77 yo F with PMH advanced dementia and currently being treated for a UTI who presented with altered mental status, primarily agitation. She started treatment for pan-sensitive E coli UTI yesterday with Keflex. At time of evaluation, the patient's agitation had resolved. The patient was responsive to commands with benign exam, not eliciting concern for acute neurological or worsening infectious process. Upon discussion with pt's daughter, she exhibited baseline status and awareness congruent with her baseline dementia, alleviating concerns for continued altered mental status. Laboratory workup did not show signs of a resistant infection and UA was clear. CXR was clear as well. Pt's UTI is responding to treatment with Keflex and her mental status has returned to baseline. In addition, she was provided with a dose of rocephin for additional UTI coverage. Pt is safe for discharge home and she and her daughter are agreeable. Pt will complete her Keflex at home.  Amount and/or Complexity of Data Reviewed Labs: ordered. Radiology: ordered.          Final Clinical Impression(s) / ED Diagnoses Final diagnoses:  Delirium  Acute cystitis without hematuria  Agitation    Rx / DC Orders ED Discharge Orders     None          Katheran James, DO 07/28/23 2038    Charlynne Pander, MD 07/28/23 [phone]

## 2023-08-19 DIAGNOSIS — M1711 Unilateral primary osteoarthritis, right knee: Secondary | ICD-10-CM | POA: Diagnosis not present

## 2023-08-19 DIAGNOSIS — M25561 Pain in right knee: Secondary | ICD-10-CM | POA: Diagnosis not present

## 2023-08-25 ENCOUNTER — Other Ambulatory Visit: Payer: Self-pay | Admitting: Medical

## 2023-08-25 NOTE — Telephone Encounter (Signed)
Is this okay to refill? 

## 2023-08-28 DIAGNOSIS — M1711 Unilateral primary osteoarthritis, right knee: Secondary | ICD-10-CM | POA: Diagnosis not present

## 2023-08-28 DIAGNOSIS — M25561 Pain in right knee: Secondary | ICD-10-CM | POA: Diagnosis not present

## 2023-08-29 ENCOUNTER — Emergency Department (HOSPITAL_COMMUNITY)
Admission: EM | Admit: 2023-08-29 | Discharge: 2023-08-29 | Disposition: A | Discharge status: Home / Self Care | Payer: PPO | Attending: Emergency Medicine | Admitting: Emergency Medicine

## 2023-08-29 DIAGNOSIS — F0390 Unspecified dementia without behavioral disturbance: Secondary | ICD-10-CM | POA: Insufficient documentation

## 2023-08-29 DIAGNOSIS — I10 Essential (primary) hypertension: Secondary | ICD-10-CM | POA: Diagnosis not present

## 2023-08-29 DIAGNOSIS — R410 Disorientation, unspecified: Secondary | ICD-10-CM | POA: Diagnosis not present

## 2023-08-29 DIAGNOSIS — N390 Urinary tract infection, site not specified: Secondary | ICD-10-CM | POA: Insufficient documentation

## 2023-08-29 LAB — URINALYSIS, W/ REFLEX TO CULTURE (INFECTION SUSPECTED)
Bilirubin Urine: NEGATIVE
Glucose, UA: NEGATIVE mg/dL
Ketones, ur: NEGATIVE mg/dL
Nitrite: NEGATIVE
Protein, ur: NEGATIVE mg/dL
Specific Gravity, Urine: 1.012 (ref 1.005–1.030)
pH: 8 (ref 5.0–8.0)

## 2023-08-29 LAB — CBC WITH DIFFERENTIAL/PLATELET
Abs Immature Granulocytes: 0.03 10*3/uL (ref 0.00–0.07)
Basophils Absolute: 0 10*3/uL (ref 0.0–0.1)
Basophils Relative: 1 %
Eosinophils Absolute: 0.1 10*3/uL (ref 0.0–0.5)
Eosinophils Relative: 2 %
HCT: 42.7 % (ref 36.0–46.0)
Hemoglobin: 14.2 g/dL (ref 12.0–15.0)
Immature Granulocytes: 0 %
Lymphocytes Relative: 15 %
Lymphs Abs: 1.1 10*3/uL (ref 0.7–4.0)
MCH: 32.1 pg (ref 26.0–34.0)
MCHC: 33.3 g/dL (ref 30.0–36.0)
MCV: 96.6 fL (ref 80.0–100.0)
Monocytes Absolute: 0.4 10*3/uL (ref 0.1–1.0)
Monocytes Relative: 6 %
Neutro Abs: 5.7 10*3/uL (ref 1.7–7.7)
Neutrophils Relative %: 76 %
Platelets: 158 10*3/uL (ref 150–400)
RBC: 4.42 MIL/uL (ref 3.87–5.11)
RDW: 13.3 % (ref 11.5–15.5)
WBC: 7.5 10*3/uL (ref 4.0–10.5)
nRBC: 0 % (ref 0.0–0.2)

## 2023-08-29 LAB — COMPREHENSIVE METABOLIC PANEL
ALT: 11 U/L (ref 0–44)
AST: 17 U/L (ref 15–41)
Albumin: 4 g/dL (ref 3.5–5.0)
Alkaline Phosphatase: 73 U/L (ref 38–126)
Anion gap: 8 (ref 5–15)
BUN: 15 mg/dL (ref 8–23)
CO2: 24 mmol/L (ref 22–32)
Calcium: 8.6 mg/dL — ABNORMAL LOW (ref 8.9–10.3)
Chloride: 101 mmol/L (ref 98–111)
Creatinine, Ser: 0.49 mg/dL (ref 0.44–1.00)
GFR, Estimated: >60 mL/min (ref >60)
Glucose, Bld: 89 mg/dL (ref 70–99)
Potassium: 3.3 mmol/L — ABNORMAL LOW (ref 3.5–5.1)
Sodium: 133 mmol/L — ABNORMAL LOW (ref 135–145)
Total Bilirubin: 0.8 mg/dL (ref <1.2)
Total Protein: 6.7 g/dL (ref 6.5–8.1)

## 2023-08-29 LAB — TSH: TSH: 0.188 u[IU]/mL — ABNORMAL LOW (ref 0.350–4.500)

## 2023-08-29 MED ORDER — CEPHALEXIN 500 MG PO CAPS: 500.0000 mg | ORAL_CAPSULE | Freq: Four times a day (QID) | ORAL | 0 refills | Status: DC

## 2023-08-29 MED ORDER — STERILE WATER FOR INJECTION IJ SOLN
INTRAMUSCULAR | Status: AC
  Administered 2023-08-29: 2.1 mL
  Filled 2023-08-29: qty 10

## 2023-08-29 MED ORDER — CEFTRIAXONE SODIUM 1 G IJ SOLR
1.0000 g | Freq: Once | INTRAMUSCULAR | Status: AC
  Administered 2023-08-29: 1 g via INTRAMUSCULAR
  Filled 2023-08-29: qty 10

## 2023-08-29 NOTE — ED Triage Notes (Signed)
Pt BIBA from home for increased agitation and confusion x3 days, usually indicates UTI. Hx dementia. 22ga LH   140/97 HR 60 100% RA CBG 105

## 2023-08-29 NOTE — ED Provider Notes (Signed)
[place] EMERGENCY DEPARTMENT AT [facility] Provider Note   CSN: [id number] Arrival date & time: 08/29/23  0831     History  Chief Complaint  Patient presents with   Altered Mental Status    Darlene Ballard is a 77 y.o. female.  77 year old female with history of dementia presents due to increased agitation.  Patient has had trouble urinating recently.  Daughter who lives with patient is at bedside.  She denies any history of trauma.  No fever or chills.  No vomiting.  Patient has self states that she feels fine.       Home Medications Prior to Admission medications   Medication Sig Start Date End Date Taking? Authorizing Provider  cephALEXin (KEFLEX) 500 MG capsule Take 1 capsule (500 mg total) by mouth 3 (three) times daily. 07/27/23   Joline Maxcy, MD  FLUoxetine (PROZAC) 10 MG capsule TAKE 1 CAPSULE (10 MG TOTAL) BY MOUTH DAILY. 08/25/23 08/24/24  Tysinger, Kermit Balo, PA-C  levothyroxine (SYNTHROID) 88 MCG tablet TAKE 1 TABLET (88 MCG TOTAL) BY MOUTH DAILY. 07/07/23 07/06/24  Tysinger, Kermit Balo, PA-C  melatonin 5 MG TABS Take 1 tablet (5 mg total) by mouth at bedtime. 07/02/23   Tysinger, Kermit Balo, PA-C      Allergies    Yellow jacket venom, Paxil [paroxetine], and Zoloft [sertraline]    Review of Systems   Review of Systems  All other systems reviewed and are negative.   Physical Exam Updated Vital Signs BP (!) 171/94 (BP Location: Right Arm)   Pulse 68   Temp 97.8 F (36.6 C) (Oral)   Resp 20   SpO2 100%  Physical Exam Vitals and nursing note reviewed.  Constitutional:      General: She is not in acute distress.    Appearance: Normal appearance. She is well-developed. She is not toxic-appearing.  HENT:     Head: Normocephalic and atraumatic.  Eyes:     General: Lids are normal.     Conjunctiva/sclera: Conjunctivae normal.     Pupils: Pupils are equal, round, and reactive to light.  Neck:     Thyroid: No thyroid mass.     Trachea: No  tracheal deviation.  Cardiovascular:     Rate and Rhythm: Normal rate and regular rhythm.     Heart sounds: Normal heart sounds. No murmur heard.    No gallop.  Pulmonary:     Effort: Pulmonary effort is normal. No respiratory distress.     Breath sounds: Normal breath sounds. No stridor. No decreased breath sounds, wheezing, rhonchi or rales.  Abdominal:     General: There is no distension.     Palpations: Abdomen is soft.     Tenderness: There is no abdominal tenderness. There is no rebound.  Musculoskeletal:        General: No tenderness. Normal range of motion.     Cervical back: Normal range of motion and neck supple.  Skin:    General: Skin is warm and dry.     Findings: No abrasion or rash.  Neurological:     General: No focal deficit present.     Mental Status: She is alert and oriented to person, place, and time. Mental status is at baseline.     GCS: GCS eye subscore is 4. GCS verbal subscore is 5. GCS motor subscore is 6.     Cranial Nerves: Cranial nerves are intact. No cranial nerve deficit.     Sensory: No sensory deficit.  Motor: Motor function is intact.  Psychiatric:        Attention and Perception: Attention normal.        Speech: Speech normal.        Behavior: Behavior normal.     ED Results / Procedures / Treatments   Labs (all labs ordered are listed, but only abnormal results are displayed) Labs Reviewed  CBC WITH DIFFERENTIAL/PLATELET  COMPREHENSIVE METABOLIC PANEL  URINALYSIS, W/ REFLEX TO CULTURE (INFECTION SUSPECTED)  TSH    EKG None  Radiology No results found.  Procedures Procedures    Medications Ordered in ED Medications - No data to display  ED Course/ Medical Decision Making/ A&P                                 Medical Decision Making Amount and/or Complexity of Data Reviewed Labs: ordered.  Risk Prescription drug management.   Urinalysis consistent with infection.  White count normal.  Low suspicion for sepsis.   On discussion with daughter who is at bedside the patient has had worsening dementia.  At times has behavioral outburst.  Patient has been calm and cooperative here.  Will give IM dose of Rocephin and discharge on Keflex        Final Clinical Impression(s) / ED Diagnoses Final diagnoses:  None    Rx / DC Orders ED Discharge Orders     None         Lorre Nick, MD 08/29/23 1059

## 2023-09-06 ENCOUNTER — Encounter: Payer: Self-pay | Admitting: Physician Assistant

## 2023-09-08 ENCOUNTER — Other Ambulatory Visit: Payer: Self-pay | Admitting: Medical

## 2023-09-13 ENCOUNTER — Inpatient Hospital Stay (HOSPITAL_COMMUNITY)
Admission: EM | Admit: 2023-09-13 | Discharge: 2023-09-25 | DRG: 394 | Disposition: A | Discharge status: Home / Self Care | Payer: PPO | Attending: Internal Medicine | Admitting: Internal Medicine

## 2023-09-13 ENCOUNTER — Encounter (HOSPITAL_COMMUNITY): Payer: Self-pay

## 2023-09-13 ENCOUNTER — Emergency Department (HOSPITAL_COMMUNITY): Payer: PPO

## 2023-09-13 ENCOUNTER — Other Ambulatory Visit: Payer: Self-pay

## 2023-09-13 DIAGNOSIS — R627 Adult failure to thrive: Secondary | ICD-10-CM | POA: Diagnosis not present

## 2023-09-13 DIAGNOSIS — G9349 Other encephalopathy: Secondary | ICD-10-CM

## 2023-09-13 DIAGNOSIS — F32A Depression, unspecified: Secondary | ICD-10-CM | POA: Diagnosis present

## 2023-09-13 DIAGNOSIS — Z8249 Family history of ischemic heart disease and other diseases of the circulatory system: Secondary | ICD-10-CM

## 2023-09-13 DIAGNOSIS — F05 Delirium due to known physiological condition: Secondary | ICD-10-CM | POA: Diagnosis not present

## 2023-09-13 DIAGNOSIS — G309 Alzheimer's disease, unspecified: Secondary | ICD-10-CM | POA: Diagnosis present

## 2023-09-13 DIAGNOSIS — F919 Conduct disorder, unspecified: Secondary | ICD-10-CM | POA: Diagnosis not present

## 2023-09-13 DIAGNOSIS — E039 Hypothyroidism, unspecified: Secondary | ICD-10-CM | POA: Diagnosis present

## 2023-09-13 DIAGNOSIS — T3695XA Adverse effect of unspecified systemic antibiotic, initial encounter: Secondary | ICD-10-CM | POA: Diagnosis present

## 2023-09-13 DIAGNOSIS — F419 Anxiety disorder, unspecified: Secondary | ICD-10-CM | POA: Diagnosis not present

## 2023-09-13 DIAGNOSIS — R197 Diarrhea, unspecified: Secondary | ICD-10-CM | POA: Diagnosis not present

## 2023-09-13 DIAGNOSIS — K521 Toxic gastroenteritis and colitis: Secondary | ICD-10-CM | POA: Diagnosis not present

## 2023-09-13 DIAGNOSIS — R300 Dysuria: Secondary | ICD-10-CM | POA: Diagnosis not present

## 2023-09-13 DIAGNOSIS — E785 Hyperlipidemia, unspecified: Secondary | ICD-10-CM | POA: Diagnosis present

## 2023-09-13 DIAGNOSIS — Z6820 Body mass index (BMI) 20.0-20.9, adult: Secondary | ICD-10-CM

## 2023-09-13 DIAGNOSIS — F02C18 Dementia in other diseases classified elsewhere, severe, with other behavioral disturbance: Secondary | ICD-10-CM | POA: Diagnosis not present

## 2023-09-13 DIAGNOSIS — R159 Full incontinence of feces: Secondary | ICD-10-CM | POA: Diagnosis not present

## 2023-09-13 DIAGNOSIS — I10 Essential (primary) hypertension: Secondary | ICD-10-CM | POA: Diagnosis not present

## 2023-09-13 DIAGNOSIS — E876 Hypokalemia: Secondary | ICD-10-CM | POA: Diagnosis not present

## 2023-09-13 DIAGNOSIS — R634 Abnormal weight loss: Secondary | ICD-10-CM | POA: Diagnosis present

## 2023-09-13 DIAGNOSIS — B999 Unspecified infectious disease: Secondary | ICD-10-CM

## 2023-09-13 DIAGNOSIS — L600 Ingrowing nail: Secondary | ICD-10-CM | POA: Diagnosis present

## 2023-09-13 DIAGNOSIS — Z888 Allergy status to other drugs, medicaments and biological substances status: Secondary | ICD-10-CM | POA: Diagnosis not present

## 2023-09-13 DIAGNOSIS — Z79890 Hormone replacement therapy (postmenopausal): Secondary | ICD-10-CM

## 2023-09-13 DIAGNOSIS — Z79899 Other long term (current) drug therapy: Secondary | ICD-10-CM

## 2023-09-13 DIAGNOSIS — F03918 Unspecified dementia, unspecified severity, with other behavioral disturbance: Secondary | ICD-10-CM | POA: Diagnosis present

## 2023-09-13 DIAGNOSIS — F0390 Unspecified dementia without behavioral disturbance: Secondary | ICD-10-CM | POA: Diagnosis not present

## 2023-09-13 DIAGNOSIS — Z87440 Personal history of urinary (tract) infections: Secondary | ICD-10-CM | POA: Diagnosis not present

## 2023-09-13 DIAGNOSIS — N3001 Acute cystitis with hematuria: Secondary | ICD-10-CM

## 2023-09-13 DIAGNOSIS — F03911 Unspecified dementia, unspecified severity, with agitation: Secondary | ICD-10-CM | POA: Diagnosis not present

## 2023-09-13 DIAGNOSIS — K449 Diaphragmatic hernia without obstruction or gangrene: Secondary | ICD-10-CM | POA: Diagnosis not present

## 2023-09-13 DIAGNOSIS — Z91038 Other insect allergy status: Secondary | ICD-10-CM

## 2023-09-13 DIAGNOSIS — R109 Unspecified abdominal pain: Secondary | ICD-10-CM | POA: Diagnosis not present

## 2023-09-13 DIAGNOSIS — F03C18 Unspecified dementia, severe, with other behavioral disturbance: Secondary | ICD-10-CM | POA: Diagnosis not present

## 2023-09-13 DIAGNOSIS — D259 Leiomyoma of uterus, unspecified: Secondary | ICD-10-CM | POA: Diagnosis present

## 2023-09-13 DIAGNOSIS — R195 Other fecal abnormalities: Secondary | ICD-10-CM | POA: Diagnosis not present

## 2023-09-13 DIAGNOSIS — Z515 Encounter for palliative care: Secondary | ICD-10-CM | POA: Diagnosis not present

## 2023-09-13 DIAGNOSIS — R8290 Unspecified abnormal findings in urine: Secondary | ICD-10-CM | POA: Diagnosis not present

## 2023-09-13 DIAGNOSIS — Z741 Need for assistance with personal care: Secondary | ICD-10-CM | POA: Diagnosis present

## 2023-09-13 DIAGNOSIS — R262 Difficulty in walking, not elsewhere classified: Secondary | ICD-10-CM | POA: Diagnosis not present

## 2023-09-13 LAB — GASTROINTESTINAL PANEL BY PCR, STOOL (REPLACES STOOL CULTURE)

## 2023-09-13 LAB — CBC WITH DIFFERENTIAL/PLATELET
Abs Immature Granulocytes: 0.01 10*3/uL (ref 0.00–0.07)
Basophils Absolute: 0 10*3/uL (ref 0.0–0.1)
Basophils Relative: 0 %
Eosinophils Absolute: 0.1 10*3/uL (ref 0.0–0.5)
Eosinophils Relative: 1 %
HCT: 43.4 % (ref 36.0–46.0)
Hemoglobin: 14.8 g/dL (ref 12.0–15.0)
Immature Granulocytes: 0 %
Lymphocytes Relative: 14 %
Lymphs Abs: 0.9 10*3/uL (ref 0.7–4.0)
MCH: 32.2 pg (ref 26.0–34.0)
MCHC: 34.1 g/dL (ref 30.0–36.0)
MCV: 94.3 fL (ref 80.0–100.0)
Monocytes Absolute: 0.4 10*3/uL (ref 0.1–1.0)
Monocytes Relative: 6 %
Neutro Abs: 5.2 10*3/uL (ref 1.7–7.7)
Neutrophils Relative %: 79 %
Platelets: 155 10*3/uL (ref 150–400)
RBC: 4.6 MIL/uL (ref 3.87–5.11)
RDW: 13.3 % (ref 11.5–15.5)
WBC: 6.5 10*3/uL (ref 4.0–10.5)
nRBC: 0 % (ref 0.0–0.2)

## 2023-09-13 LAB — URINALYSIS, ROUTINE W REFLEX MICROSCOPIC
Bilirubin Urine: NEGATIVE
Glucose, UA: NEGATIVE mg/dL
Ketones, ur: NEGATIVE mg/dL
Nitrite: NEGATIVE
Protein, ur: NEGATIVE mg/dL
Specific Gravity, Urine: 1.035 — ABNORMAL HIGH (ref 1.005–1.030)
WBC, UA: >50 WBC/hpf (ref 0–5)
pH: 6 (ref 5.0–8.0)

## 2023-09-13 LAB — COMPREHENSIVE METABOLIC PANEL
ALT: 12 U/L (ref 0–44)
AST: 22 U/L (ref 15–41)
Albumin: 4.3 g/dL (ref 3.5–5.0)
Alkaline Phosphatase: 78 U/L (ref 38–126)
Anion gap: 7 (ref 5–15)
BUN: 14 mg/dL (ref 8–23)
CO2: 27 mmol/L (ref 22–32)
Calcium: 8.6 mg/dL — ABNORMAL LOW (ref 8.9–10.3)
Chloride: 103 mmol/L (ref 98–111)
Creatinine, Ser: 0.53 mg/dL (ref 0.44–1.00)
GFR, Estimated: >60 mL/min (ref >60)
Glucose, Bld: 82 mg/dL (ref 70–99)
Potassium: 3.3 mmol/L — ABNORMAL LOW (ref 3.5–5.1)
Sodium: 137 mmol/L (ref 135–145)
Total Bilirubin: 0.8 mg/dL (ref <1.2)
Total Protein: 7.4 g/dL (ref 6.5–8.1)

## 2023-09-13 LAB — C DIFFICILE QUICK SCREEN W PCR REFLEX
C Diff antigen: NEGATIVE
C Diff interpretation: NOT DETECTED
C Diff toxin: NEGATIVE

## 2023-09-13 LAB — LIPASE, BLOOD: Lipase: 36 U/L (ref 11–51)

## 2023-09-13 LAB — TSH: TSH: 0.251 u[IU]/mL — ABNORMAL LOW (ref 0.350–4.500)

## 2023-09-13 LAB — T4, FREE: Free T4: 1.07 ng/dL (ref 0.61–1.12)

## 2023-09-13 MED ORDER — SODIUM CHLORIDE 0.9 % IV SOLN
1.0000 g | Freq: Once | INTRAVENOUS | Status: AC
  Administered 2023-09-13: 1 g via INTRAVENOUS
  Filled 2023-09-13: qty 10

## 2023-09-13 MED ORDER — OXYCODONE HCL 5 MG PO TABS: 5.0000 mg | ORAL_TABLET | ORAL | Status: DC | PRN

## 2023-09-13 MED ORDER — POTASSIUM CHLORIDE 20 MEQ PO PACK
40.0000 meq | PACK | Freq: Once | ORAL | Status: AC
  Administered 2023-09-13: 40 meq via ORAL
  Filled 2023-09-13: qty 2

## 2023-09-13 MED ORDER — ONDANSETRON HCL 4 MG/2ML IJ SOLN: 4.0000 mg | Freq: Four times a day (QID) | INTRAMUSCULAR | Status: DC | PRN

## 2023-09-13 MED ORDER — SODIUM CHLORIDE 0.9 % IV SOLN: 2.0000 g | INTRAVENOUS | Status: DC

## 2023-09-13 MED ORDER — LOPERAMIDE HCL 2 MG PO CAPS: 4.0000 mg | ORAL_CAPSULE | ORAL | Status: DC | PRN

## 2023-09-13 MED ORDER — FAMOTIDINE IN NACL 20-0.9 MG/50ML-% IV SOLN
20.0000 mg | Freq: Once | INTRAVENOUS | Status: AC
  Administered 2023-09-13: 20 mg via INTRAVENOUS
  Filled 2023-09-13: qty 50

## 2023-09-13 MED ORDER — IOHEXOL 300 MG/ML  SOLN
100.0000 mL | Freq: Once | INTRAMUSCULAR | Status: AC | PRN
  Administered 2023-09-13: 100 mL via INTRAVENOUS

## 2023-09-13 MED ORDER — FLUOXETINE HCL 10 MG PO CAPS
10.0000 mg | ORAL_CAPSULE | Freq: Every day | ORAL | Status: DC
  Administered 2023-09-14 – 2023-09-25 (×12): 10 mg via ORAL
  Filled 2023-09-13 (×13): qty 1

## 2023-09-13 MED ORDER — ACETAMINOPHEN 325 MG PO TABS: 650.0000 mg | ORAL_TABLET | Freq: Four times a day (QID) | ORAL | Status: DC | PRN

## 2023-09-13 MED ORDER — SODIUM CHLORIDE 0.9 % IV BOLUS
1000.0000 mL | Freq: Once | INTRAVENOUS | Status: AC
Start: 2023-09-13 — End: 2023-09-13
  Administered 2023-09-13: 1000 mL via INTRAVENOUS

## 2023-09-13 MED ORDER — ONDANSETRON HCL 4 MG/2ML IJ SOLN
4.0000 mg | Freq: Once | INTRAMUSCULAR | Status: AC
  Administered 2023-09-13: 4 mg via INTRAVENOUS
  Filled 2023-09-13: qty 2

## 2023-09-13 MED ORDER — MORPHINE SULFATE (PF) 4 MG/ML IV SOLN
4.0000 mg | Freq: Once | INTRAVENOUS | Status: AC
  Administered 2023-09-13: 4 mg via INTRAVENOUS
  Filled 2023-09-13: qty 1

## 2023-09-13 MED ORDER — HALOPERIDOL LACTATE 5 MG/ML IJ SOLN
5.0000 mg | Freq: Once | INTRAMUSCULAR | Status: AC
  Administered 2023-09-13: 5 mg via INTRAVENOUS

## 2023-09-13 MED ORDER — DICYCLOMINE HCL 10 MG PO CAPS
10.0000 mg | ORAL_CAPSULE | Freq: Once | ORAL | Status: AC
Start: 2023-09-13 — End: 2023-09-13
  Administered 2023-09-13: 10 mg via ORAL
  Filled 2023-09-13: qty 1

## 2023-09-13 MED ORDER — HALOPERIDOL LACTATE 5 MG/ML IJ SOLN
INTRAMUSCULAR | Status: AC
  Filled 2023-09-13: qty 1

## 2023-09-13 MED ORDER — SODIUM CHLORIDE 0.9 % IV BOLUS
1000.0000 mL | Freq: Once | INTRAVENOUS | Status: AC
  Administered 2023-09-13: 1000 mL via INTRAVENOUS

## 2023-09-13 MED ORDER — LEVOTHYROXINE SODIUM 88 MCG PO TABS
88.0000 ug | ORAL_TABLET | Freq: Every day | ORAL | Status: DC
  Administered 2023-09-14 – 2023-09-24 (×11): 88 ug via ORAL
  Filled 2023-09-13 (×11): qty 1

## 2023-09-13 MED ORDER — ENOXAPARIN SODIUM 40 MG/0.4ML IJ SOSY
40.0000 mg | PREFILLED_SYRINGE | INTRAMUSCULAR | Status: DC
  Administered 2023-09-13 – 2023-09-24 (×12): 40 mg via SUBCUTANEOUS
  Filled 2023-09-13 (×12): qty 0.4

## 2023-09-13 MED ORDER — MELATONIN 5 MG PO TABS
5.0000 mg | ORAL_TABLET | Freq: Once | ORAL | Status: AC
  Administered 2023-09-13: 5 mg via ORAL
  Filled 2023-09-13: qty 1

## 2023-09-13 MED ORDER — ACETAMINOPHEN 650 MG RE SUPP: 650.0000 mg | Freq: Four times a day (QID) | RECTAL | Status: DC | PRN

## 2023-09-13 MED ORDER — ONDANSETRON HCL 4 MG PO TABS: 4.0000 mg | ORAL_TABLET | Freq: Four times a day (QID) | ORAL | Status: DC | PRN

## 2023-09-13 NOTE — ED Triage Notes (Signed)
Patient's caregiver reports patient has had diarrhea since 11/18. Patient has been eating well and denies nausea and vomiting. Patient intermittently c/o abdominal pain. Caregiver states she has been attempting to exclude different food from her diet without relief.

## 2023-09-13 NOTE — ED Notes (Signed)
Called floor to have nurse information added. States will add when able, charge RN unavailable

## 2023-09-13 NOTE — H&P (Signed)
History and Physical    DALORES SHYTLE MVH:[id number] DOB: 1946-03-26 DOA: 09/13/2023  PCP: Jac Canavan, PA-C   Chief Complaint:  diarrhea, ams  HPI: Darlene Ballard is a 77 y.o. female with medical history significant of anxiety, dementia, hypothyroidism who presents emergency department due to diarrhea.  Over the last 2 weeks patient's had numerous bowel movements over the last 2 days diarrhea is progressed to hourly.  She is also had poor p.o. intake and changes in mentation with increasing agitation.  Her daughter is her primary caretaker and was concerned about her worsening clinical status so brought her to the ER for further assessment.  On arrival to the emergency department she was afebrile hemodynamically stable.  Labs were obtained on presentation which revealed WBC 6.5, hemoglobin 14.8, potassium 3.3, creatinine 0.53, TSH 0.25, C. difficile negative, GI pathogen panel is currently pending.  Urinalysis concerning for infection.  Patient had a CT abdomen pelvis which demonstrated stable adnexal cysts as well as hiatal hernia and fibroids.  Patient was started on ceftriaxone, given IV fluids and admitted for further workup. Patient was unable to provide any meaningful history and was only oriented to self.    Review of Systems: Review of Systems  Constitutional:  Negative for chills and fever.  HENT: Negative.    Eyes: Negative.   Respiratory: Negative.    Cardiovascular: Negative.   Gastrointestinal:  Positive for diarrhea.  Genitourinary: Negative.   Musculoskeletal: Negative.   Skin: Negative.   Neurological: Negative.   Endo/Heme/Allergies: Negative.   Psychiatric/Behavioral: Negative.       As per HPI otherwise 10 point review of systems negative.   Allergies  Allergen Reactions   Yellow Jacket Venom Anaphylaxis and Swelling   Paxil [Paroxetine] Other (See Comments)    "Anger issues"   Zoloft [Sertraline] Other (See Comments)    "Anger issues"    Past Medical  History:  Diagnosis Date   Anxiety    Chronic knee pain    Dementia (HCC)    Hypothyroidism    Onychodystrophy    Sleep disturbance     Past Surgical History:  Procedure Laterality Date   TONSILLECTOMY AND ADENOIDECTOMY       reports that she has never smoked. She has never used smokeless tobacco. She reports that she does not drink alcohol and does not use drugs.  Family History  Problem Relation Age of Onset   Alzheimer's disease Mother    Heart disease Mother    Heart attack Mother    Other Father        blood disorder - unsure of condition    Prior to Admission medications   Medication Sig Start Date End Date Taking? Authorizing Provider  FLUoxetine (PROZAC) 10 MG capsule TAKE 1 CAPSULE (10 MG TOTAL) BY MOUTH DAILY. 08/25/23 08/24/24 Yes Tysinger, Kermit Balo, PA-C  levothyroxine (SYNTHROID) 88 MCG tablet TAKE 1 TABLET (88 MCG TOTAL) BY MOUTH DAILY. 09/08/23 09/07/24 Yes Tysinger, Kermit Balo, PA-C  melatonin 5 MG TABS Take 1 tablet (5 mg total) by mouth at bedtime. 07/02/23  Yes Tysinger, Kermit Balo, PA-C  cephALEXin (KEFLEX) 500 MG capsule Take 1 capsule (500 mg total) by mouth 4 (four) times daily. Patient not taking: Reported on 09/13/2023 08/29/23   Lorre Nick, MD    Physical Exam: Vitals:   09/13/23 0746 09/13/23 1058 09/13/23 1145 09/13/23 1401  BP: (!) 150/79 (!) 168/80 (!) 168/101 102/87  Pulse: (!) 55 (!) 55 (!) 54 60  Resp: 18  12  13  Temp: (!) 97.5 F (36.4 C) (!) 97.5 F (36.4 C)  97.9 F (36.6 C)  TempSrc: Oral     SpO2: 100% 100% 100% 90%  Weight: 51.3 kg     Height: 5\' 2"  (1.575 m)      Physical Exam Constitutional:      Appearance: She is normal weight.  HENT:     Head: Normocephalic.     Nose: Nose normal.     Mouth/Throat:     Mouth: Mucous membranes are moist.     Pharynx: Oropharynx is clear.  Eyes:     Conjunctiva/sclera: Conjunctivae normal.     Pupils: Pupils are equal, round, and reactive to light.  Cardiovascular:     Rate and  Rhythm: Normal rate and regular rhythm.     Pulses: Normal pulses.     Heart sounds: Normal heart sounds.  Pulmonary:     Effort: Pulmonary effort is normal.     Breath sounds: Normal breath sounds.  Abdominal:     General: Abdomen is flat. Bowel sounds are normal.  Musculoskeletal:        General: Normal range of motion.     Cervical back: Normal range of motion.  Skin:    General: Skin is warm.     Capillary Refill: Capillary refill takes less than 2 seconds.  Neurological:     General: No focal deficit present.     Mental Status: She is alert. Mental status is at baseline.  Psychiatric:        Mood and Affect: Mood normal.        Labs on Admission: I have personally reviewed the patients's labs and imaging studies.  Assessment/Plan  # Acute infectious encephalopathy most likely secondary to urinary tract infection, POA, active # Dementia - Patient found to have UTI - Increased agitation from baseline -Patient was recently treated with Keflex in outpatient setting for UTI without improvement in symptoms  Plan: Start ceftriaxone as cultures are sensitive  # Watery diarrhea with resultant dehydration # Failure to thrive - Patient is been refusing p.o. in setting of 20+ bowel movements per day - C. difficile negative - GI pathogen panel pending  Plan: Follow-up GI pathogen panel Replete potassium Status post IV fluids Start Imodium as C. difficile is negative  # Hypothyroidism-continue levothyroxine  # Depression-continue Prozac   Admission status: Inpatient   Certification: The appropriate patient status for this patient is INPATIENT. Inpatient status is judged to be reasonable and necessary in order to provide the required intensity of service to ensure the patient's safety. The patient's presenting symptoms, physical exam findings, and initial radiographic and laboratory data in the context of their chronic comorbidities is felt to place them at high risk for  further clinical deterioration. Furthermore, it is not anticipated that the patient will be medically stable for discharge from the hospital within 2 midnights of admission.   * I certify that at the point of admission it is my clinical judgment that the patient will require inpatient hospital care spanning beyond 2 midnights from the point of admission due to high intensity of service, high risk for further deterioration and high frequency of surveillance required.Alan Mulder MD [facility] If 7PM-7AM, please contact night-coverage [email or web address]  09/13/2023, 3:17 PM

## 2023-09-13 NOTE — ED Provider Notes (Signed)
[facility] Provider Note  CSN: [id number] Arrival date & time: 09/13/23 1478  Chief Complaint(s) Diarrhea  HPI Darlene Ballard is a 77 y.o. female with past medical history as below, significant for chronic knee pain, dementia, HLD, HTN who presents to the ED with complaint of diarrhea.   She is here with daughter who is primary caretaker.  Patient with profuse diarrhea over the past 2 weeks, has worsened the past couple days.  Past 24 hours she has been having multiple bowel movements daily, every 30-45 minutes.  Profuse watery bowel movements.  No formed stool the past 24 hours.  No vomiting.  Intermittently refusing p.o. past 24 hours.  Abdominal discomfort intermittently associated with the bowel movements.  No BRBPR or melena reported.  No fevers or chills, no recent travel or sick contacts,suspicious p.o. intake.  She was on antibiotics recently for UTI, no history of C. Difficile  Patient has dementia, increased agitation from baseline   Past Medical History Past Medical History:  Diagnosis Date   Anxiety    Chronic knee pain    Dementia (HCC)    Hypothyroidism    Onychodystrophy    Sleep disturbance    Patient Active Problem List   Diagnosis Date Noted   Infectious encephalopathy 09/13/2023   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   Behavior disturbance 07/02/2023   Sleep disturbance 07/02/2023   Abnormal brain MRI 04/27/2023   Self-care deficit for bathing and hygiene 04/27/2023   Neglected elder 04/27/2023   Encounter for health maintenance examination in adult 06/10/2022   Advance directive discussed with patient 06/10/2022   Vaccine counseling 06/10/2022   Medicare annual wellness visit, subsequent 06/10/2022   Hypothyroidism 06/26/2021   Chronic pain of both knees 02/08/2020   Dementia without behavioral disturbance (HCC) 08/09/2019   Hyperlipidemia 02/01/2019   Anxiety 08/03/2018   Hypertension 08/03/2018   Home Medication(s) Prior to Admission  medications   Medication Sig Start Date End Date Taking? Authorizing Provider  FLUoxetine (PROZAC) 10 MG capsule TAKE 1 CAPSULE (10 MG TOTAL) BY MOUTH DAILY. 08/25/23 08/24/24 Yes Tysinger, Kermit Balo, PA-C  levothyroxine (SYNTHROID) 88 MCG tablet TAKE 1 TABLET (88 MCG TOTAL) BY MOUTH DAILY. 09/08/23 09/07/24 Yes Tysinger, Kermit Balo, PA-C  melatonin 5 MG TABS Take 1 tablet (5 mg total) by mouth at bedtime. 07/02/23  Yes Tysinger, Kermit Balo, PA-C  cephALEXin (KEFLEX) 500 MG capsule Take 1 capsule (500 mg total) by mouth 4 (four) times daily. Patient not taking: Reported on 09/13/2023 08/29/23   Lorre Nick, MD                                                                                                                                    Past Surgical History Past Surgical History:  Procedure Laterality Date   TONSILLECTOMY AND ADENOIDECTOMY     Family History Family History  Problem Relation Age of Onset   Alzheimer's disease Mother  Heart disease Mother    Heart attack Mother    Other Father        blood disorder - unsure of condition    Social History Social History   Tobacco Use   Smoking status: Never   Smokeless tobacco: Never  Vaping Use   Vaping status: Never Used  Substance Use Topics   Alcohol use: Never   Drug use: Never   Allergies Yellow jacket venom, Paxil [paroxetine], and Zoloft [sertraline]  Review of Systems Review of Systems  Unable to perform ROS: Dementia  Constitutional:  Negative for chills and fever.  Gastrointestinal:  Positive for abdominal pain and diarrhea. Negative for nausea and vomiting.  Genitourinary:  Negative for dysuria.    Physical Exam Vital Signs  I have reviewed the triage vital signs BP (!) 142/78 (BP Location: Right Arm)   Pulse (!) 56   Temp 98.1 F (36.7 C) (Oral)   Resp 18   Ht 5\' 2"  (1.575 m)   Wt 51.3 kg   SpO2 100%   BMI 20.67 kg/m  Physical Exam Vitals and nursing note reviewed.  Constitutional:      General:  She is not in acute distress.    Appearance: Normal appearance.  HENT:     Head: Normocephalic and atraumatic.     Right Ear: External ear normal.     Left Ear: External ear normal.     Nose: Nose normal.     Mouth/Throat:     Mouth: Mucous membranes are moist.  Eyes:     General: No scleral icterus.       Right eye: No discharge.        Left eye: No discharge.  Cardiovascular:     Rate and Rhythm: Normal rate and regular rhythm.     Pulses: Normal pulses.     Heart sounds: Normal heart sounds.  Pulmonary:     Effort: Pulmonary effort is normal. No respiratory distress.     Breath sounds: Normal breath sounds. No stridor.  Abdominal:     General: Abdomen is flat. There is no distension.     Palpations: Abdomen is soft.     Tenderness: There is abdominal tenderness in the epigastric area.  Musculoskeletal:     Cervical back: No rigidity.     Right lower leg: No edema.     Left lower leg: No edema.  Skin:    General: Skin is warm and dry.     Capillary Refill: Capillary refill takes less than 2 seconds.  Neurological:     Mental Status: She is alert.  Psychiatric:        Mood and Affect: Mood normal.        Behavior: Behavior normal. Behavior is cooperative.     ED Results and Treatments Labs (all labs ordered are listed, but only abnormal results are displayed) Labs Reviewed  COMPREHENSIVE METABOLIC PANEL - Abnormal; Notable for the following components:      Result Value   Potassium 3.3 (*)    Calcium 8.6 (*)    All other components within normal limits  URINALYSIS, ROUTINE W REFLEX MICROSCOPIC - Abnormal; Notable for the following components:   APPearance HAZY (*)    Specific Gravity, Urine 1.035 (*)    Hgb urine dipstick SMALL (*)    Leukocytes,Ua LARGE (*)    Bacteria, UA RARE (*)    All other components within normal limits  TSH - Abnormal; Notable for the following components:   TSH  0.251 (*)    All other components within normal limits  C DIFFICILE  QUICK SCREEN W PCR REFLEX    GASTROINTESTINAL PANEL BY PCR, STOOL (REPLACES STOOL CULTURE)  URINE CULTURE  CBC WITH DIFFERENTIAL/PLATELET  LIPASE, BLOOD  T4, FREE  CBC  BASIC METABOLIC PANEL                                                                                                                          Radiology CT ABDOMEN PELVIS W CONTRAST  Result Date: 09/13/2023 CLINICAL DATA:  Abdominal pain and diarrhea for approximately 2 weeks. EXAM: CT ABDOMEN AND PELVIS WITH CONTRAST TECHNIQUE: Multidetector CT imaging of the abdomen and pelvis was performed using the standard protocol following bolus administration of intravenous contrast. RADIATION DOSE REDUCTION: This exam was performed according to the departmental dose-optimization program which includes automated exposure control, adjustment of the mA and/or kV according to patient size and/or use of iterative reconstruction technique. CONTRAST:  OMNIPAQUE IOHEXOL 300 MG/ML  SOLN COMPARISON:  06/17/2023 FINDINGS: Lower Chest: No acute findings. Previously seen left pleural effusion has resolved. Hepatobiliary: No suspicious hepatic masses identified. Gallbladder is unremarkable. No evidence of biliary ductal dilatation. Pancreas:  No mass or inflammatory changes. Spleen: Within normal limits in size and appearance. Adrenals/Urinary Tract: No suspicious masses identified. No evidence of ureteral calculi or hydronephrosis. Stomach/Bowel: Small to moderate hiatal hernia is again seen. No evidence of obstruction, bowel wall thickening or other inflammatory process, or abnormal fluid collections. Vascular/Lymphatic: No pathologically enlarged lymph nodes. No acute vascular findings. Reproductive: Several small calcified uterine fibroids are again noted, largest measuring 1.6 cm. A 2.4 cm benign-appearing cyst is again seen in the right adnexa, which remains stable. Other:  None. Musculoskeletal: No suspicious bone lesions identified. Mild wedge  compression deformity of the T12 vertebral body is again noted. IMPRESSION: No acute findings. Small to moderate hiatal hernia. Small calcified uterine fibroids. Stable 2.4 cm benign-appearing right adnexal cyst. No follow-up imaging recommended. Reference: JACR 2020 Feb; 17(2):248-254 Electronically Signed   By: Danae Orleans M.D.   On: 09/13/2023 10:12    Pertinent labs & imaging results that were available during my care of the patient were reviewed by me and considered in my medical decision making (see MDM for details).  Medications Ordered in ED Medications  FLUoxetine (PROZAC) capsule 10 mg (has no administration in time range)  levothyroxine (SYNTHROID) tablet 88 mcg (has no administration in time range)  enoxaparin (LOVENOX) injection 40 mg (has no administration in time range)  acetaminophen (TYLENOL) tablet 650 mg (has no administration in time range)    Or  acetaminophen (TYLENOL) suppository 650 mg (has no administration in time range)  oxyCODONE (Oxy IR/ROXICODONE) immediate release tablet 5 mg (has no administration in time range)  ondansetron (ZOFRAN) tablet 4 mg (has no administration in time range)    Or  ondansetron (ZOFRAN) injection 4 mg (has no administration in time range)  loperamide (IMODIUM) capsule 4 mg (  has no administration in time range)  cefTRIAXone (ROCEPHIN) 2 g in sodium chloride 0.9 % 100 mL IVPB (has no administration in time range)  haloperidol lactate (HALDOL) 5 MG/ML injection (  Not Given 09/13/23 1717)  melatonin tablet 5 mg (has no administration in time range)  sodium chloride 0.9 % bolus 1,000 mL (has no administration in time range)  sodium chloride 0.9 % bolus 1,000 mL (0 mLs Intravenous Stopped 09/13/23 1000)  iohexol (OMNIPAQUE) 300 MG/ML solution 100 mL (100 mLs Intravenous Contrast Given 09/13/23 0932)  cefTRIAXone (ROCEPHIN) 1 g in sodium chloride 0.9 % 100 mL IVPB (0 g Intravenous Stopped 09/13/23 1300)  morphine (PF) 4 MG/ML injection 4 mg (4 mg  Intravenous Given 09/13/23 1248)  ondansetron (ZOFRAN) injection 4 mg (4 mg Intravenous Given 09/13/23 1248)  famotidine (PEPCID) IVPB 20 mg premix (20 mg Intravenous New Bag/Given 09/13/23 1249)  dicyclomine (BENTYL) capsule 10 mg (10 mg Oral Given 09/13/23 1251)  potassium chloride (KLOR-CON) packet 40 mEq (40 mEq Oral Given 09/13/23 1723)  haloperidol lactate (HALDOL) injection 5 mg (5 mg Intravenous Given 09/13/23 1600)                                                                                                                                     Procedures Procedures  (including critical care time)  Medical Decision Making / ED Course    Medical Decision Making:    MARSIA LEWERS is a 77 y.o. female with past medical history as below, significant for chronic knee pain, dementia, HLD, HTN who presents to the ED with complaint of diarrhea. . The complaint involves an extensive differential diagnosis and also carries with it a high risk of complications and morbidity.  Serious etiology was considered. Ddx includes but is not limited to: Differential diagnosis includes but is not exclusive to acute cholecystitis, intrathoracic causes for epigastric abdominal pain, gastritis, duodenitis, pancreatitis, small bowel or large bowel obstruction, abdominal aortic aneurysm, hernia, gastritis, c.diff, colitis etc.   Complete initial physical exam performed, notably the patient was in no acute distress, abdomen nonperitoneal, HDS.    Reviewed and confirmed nursing documentation for past medical history, family history, social history.  Vital signs reviewed.    Patient here with diarrhea for the past 2 weeks or so, no vomiting, generalized abdominal discomfort, epigastric discomfort.  Recent antibiotics.  Check screening labs, send C. difficile, give fluids, get imaging  Clinical Course as of 09/13/23 1957  Sun Sep 13, 2023  1116 C Diff interpretation: No C. difficile detected. [SG]  1116  Creatinine: 0.53 stable [SG]  1117 WBC: 6.5 stable [SG]  1120 Urine culture reviewed from 10/10 pansensitive E. Coli; she completed Keflex. Looks to still have UA concerning for infection today  [SG]  1328 CT imaging is stable. [SG]  1439 Pt with ongoing diarrhea, intermittently refusing PO. Interimttent hallucinations similar to prior. She has recurrent UTI, ongoing diarrhea.  Her labs are stable but given her age, underlying dementia and profuse diarrhea pt high risk for decompensation now that she is having intermittent po refusal. Recommend admit/obs for rehydration, UTI treatment  [SG]    Clinical Course User Index [SG] Sloan Leiter, DO    Brief summary: 77 yo female here with profuse diarrhea, worse last 24 hours but ongoing around 2 wks. No hx cdiff, +recent abx. Cdiff neg here, labs stable. UA does show some ongoing infection, send culture. Start rocephin. Progressive dementia, diarrhea, intermittent PO refusal, reasonable to bring overnight for serial observation and IVF rehydration. Admitted [facility]  Dr Avie Arenas                Additional history obtained: -Additional history obtained from family -External records from outside source obtained and reviewed including: Chart review including previous notes, labs, imaging, consultation notes including  Prior cultures, prior ed eval   Lab Tests: -I ordered, reviewed, and interpreted labs.   The pertinent results include:   Labs Reviewed  COMPREHENSIVE METABOLIC PANEL - Abnormal; Notable for the following components:      Result Value   Potassium 3.3 (*)    Calcium 8.6 (*)    All other components within normal limits  URINALYSIS, ROUTINE W REFLEX MICROSCOPIC - Abnormal; Notable for the following components:   APPearance HAZY (*)    Specific Gravity, Urine 1.035 (*)    Hgb urine dipstick SMALL (*)    Leukocytes,Ua LARGE (*)    Bacteria, UA RARE (*)    All other components within normal limits  TSH - Abnormal; Notable  for the following components:   TSH 0.251 (*)    All other components within normal limits  C DIFFICILE QUICK SCREEN W PCR REFLEX    GASTROINTESTINAL PANEL BY PCR, STOOL (REPLACES STOOL CULTURE)  URINE CULTURE  CBC WITH DIFFERENTIAL/PLATELET  LIPASE, BLOOD  T4, FREE  CBC  BASIC METABOLIC PANEL    Notable for UTI  EKG   EKG Interpretation Date/Time:    Ventricular Rate:    PR Interval:    QRS Duration:    QT Interval:    QTC Calculation:   R Axis:      Text Interpretation:           Imaging Studies ordered: I ordered imaging studies including CT abd  I independently visualized the following imaging with scope of interpretation limited to determining acute life threatening conditions related to emergency care; findings noted above I independently visualized and interpreted imaging. I agree with the radiologist interpretation   Medicines ordered and prescription drug management: Meds ordered this encounter  Medications   sodium chloride 0.9 % bolus 1,000 mL   iohexol (OMNIPAQUE) 300 MG/ML solution 100 mL   cefTRIAXone (ROCEPHIN) 1 g in sodium chloride 0.9 % 100 mL IVPB    Order Specific Question:   Antibiotic Indication:    Answer:   UTI   morphine (PF) 4 MG/ML injection 4 mg   ondansetron (ZOFRAN) injection 4 mg   famotidine (PEPCID) IVPB 20 mg premix   dicyclomine (BENTYL) capsule 10 mg   FLUoxetine (PROZAC) capsule 10 mg   levothyroxine (SYNTHROID) tablet 88 mcg   enoxaparin (LOVENOX) injection 40 mg   OR Linked Order Group    acetaminophen (TYLENOL) tablet 650 mg    acetaminophen (TYLENOL) suppository 650 mg   oxyCODONE (Oxy IR/ROXICODONE) immediate release tablet 5 mg   OR Linked Order Group    ondansetron (ZOFRAN) tablet 4 mg  ondansetron (ZOFRAN) injection 4 mg   potassium chloride (KLOR-CON) packet 40 mEq   loperamide (IMODIUM) capsule 4 mg   haloperidol lactate (HALDOL) injection 5 mg   cefTRIAXone (ROCEPHIN) 2 g in sodium chloride 0.9 % 100 mL  IVPB    Order Specific Question:   Antibiotic Indication:    Answer:   CAP   haloperidol lactate (HALDOL) 5 MG/ML injection    Dalia Heading S: cabinet override   melatonin tablet 5 mg   sodium chloride 0.9 % bolus 1,000 mL    -I have reviewed the patients home medicines and have made adjustments as needed   Consultations Obtained: na   Cardiac Monitoring:  Continuous pulse oximetry interpreted by myself, 99% on RA.    Social Determinants of Health:  Diagnosis or treatment significantly limited by social determinants of health: na   Reevaluation: After the interventions noted above, I reevaluated the patient and found that they have improved  Co morbidities that complicate the patient evaluation  Past Medical History:  Diagnosis Date   Anxiety    Chronic knee pain    Dementia (HCC)    Hypothyroidism    Onychodystrophy    Sleep disturbance       Dispostion: Disposition decision including need for hospitalization was considered, and patient admitted to the hospital.    Final Clinical Impression(s) / ED Diagnoses Final diagnoses:  Diarrhea of presumed infectious origin  Acute cystitis with hematuria  Dementia, unspecified dementia severity, unspecified dementia type, unspecified whether behavioral, psychotic, or mood disturbance or anxiety (HCC)        Sloan Leiter, DO 09/13/23 1958

## 2023-09-14 ENCOUNTER — Ambulatory Visit: Payer: PPO | Admitting: Physician Assistant

## 2023-09-14 DIAGNOSIS — G9349 Other encephalopathy: Secondary | ICD-10-CM | POA: Diagnosis not present

## 2023-09-14 DIAGNOSIS — B999 Unspecified infectious disease: Secondary | ICD-10-CM | POA: Diagnosis not present

## 2023-09-14 LAB — BASIC METABOLIC PANEL
Anion gap: 9 (ref 5–15)
BUN: 9 mg/dL (ref 8–23)
CO2: 25 mmol/L (ref 22–32)
Calcium: 8.3 mg/dL — ABNORMAL LOW (ref 8.9–10.3)
Chloride: 103 mmol/L (ref 98–111)
Creatinine, Ser: 0.56 mg/dL (ref 0.44–1.00)
GFR, Estimated: >60 mL/min (ref >60)
Glucose, Bld: 116 mg/dL — ABNORMAL HIGH (ref 70–99)
Potassium: 3.1 mmol/L — ABNORMAL LOW (ref 3.5–5.1)
Sodium: 137 mmol/L (ref 135–145)

## 2023-09-14 LAB — CBC
HCT: 41.8 % (ref 36.0–46.0)
Hemoglobin: 13.7 g/dL (ref 12.0–15.0)
MCH: 31.6 pg (ref 26.0–34.0)
MCHC: 32.8 g/dL (ref 30.0–36.0)
MCV: 96.5 fL (ref 80.0–100.0)
Platelets: 160 10*3/uL (ref 150–400)
RBC: 4.33 MIL/uL (ref 3.87–5.11)
RDW: 13.3 % (ref 11.5–15.5)
WBC: 7.2 10*3/uL (ref 4.0–10.5)
nRBC: 0 % (ref 0.0–0.2)

## 2023-09-14 MED ORDER — BOOST / RESOURCE BREEZE PO LIQD CUSTOM
1.0000 | Freq: Three times a day (TID) | ORAL | Status: DC
  Administered 2023-09-15 – 2023-09-25 (×25): 1 via ORAL

## 2023-09-14 MED ORDER — HALOPERIDOL LACTATE 5 MG/ML IJ SOLN
2.0000 mg | Freq: Once | INTRAMUSCULAR | Status: AC
  Administered 2023-09-15: 2 mg via INTRAVENOUS
  Filled 2023-09-14: qty 1

## 2023-09-14 MED ORDER — HALOPERIDOL LACTATE 5 MG/ML IJ SOLN
2.0000 mg | Freq: Once | INTRAMUSCULAR | Status: DC
  Filled 2023-09-14: qty 1

## 2023-09-14 MED ORDER — POTASSIUM CHLORIDE CRYS ER 20 MEQ PO TBCR
40.0000 meq | EXTENDED_RELEASE_TABLET | ORAL | Status: AC
  Administered 2023-09-14 (×2): 40 meq via ORAL
  Filled 2023-09-14 (×2): qty 2

## 2023-09-14 MED ORDER — HALOPERIDOL LACTATE 5 MG/ML IJ SOLN
2.0000 mg | Freq: Once | INTRAMUSCULAR | Status: AC
  Administered 2023-09-14: 2 mg via INTRAMUSCULAR

## 2023-09-14 NOTE — Evaluation (Signed)
OT Cancellation Note  Patient Details Name: Darlene Ballard MRN: [id number] DOB: 1946/05/22   Cancelled Treatment:    Reason Eval/Treat Not Completed: Other (comment). Orders received, chart reviewed. Chart indicating pt agitated and only oriented to self. Discussed with RN - pt not following commands and remains confused. OT will hold at this time and re-attempt when pt able to participate in therapy.  Esias Mory L. Amnah Breuer, OTR/L  09/14/23, 9:40 AM

## 2023-09-14 NOTE — Evaluation (Signed)
Physical Therapy Evaluation Patient Details Name: Darlene Ballard MRN: [id number] DOB: 1946-06-04 Today's Date: 09/14/2023  History of Present Illness  77 y.o. female who presents to [facility] due to diarrhea.  C difficile negative.  medical history significant of anxiety, dementia, hypothyroidism  Clinical Impression  Pt admitted with above diagnosis.  Pt agreeable to mobility with multi-modal cues for participation. Amb short distance with HHA and returned to chair. Mildly restless, given towels to fold and her stuffed animal, pt calm on PT departure.  Patient will benefit from continued inpatient follow up therapy, <3 hours/day   Pt currently with functional limitations due to the deficits listed below (see PT Problem List). Pt will benefit from acute skilled PT to increase their independence and safety with mobility to allow discharge.           If plan is discharge home, recommend the following: Help with stairs or ramp for entrance;Assist for transportation;Assistance with cooking/housework;A little help with bathing/dressing/bathroom;A little help with walking and/or transfers;Direct supervision/assist for financial management;Supervision due to cognitive status;Direct supervision/assist for medications management   Can travel by private vehicle   No    Equipment Recommendations None recommended by PT  Recommendations for Other Services       Functional Status Assessment Patient has had a recent decline in their functional status and demonstrates the ability to make significant improvements in function in a reasonable and predictable amount of time.     Precautions / Restrictions Precautions Precautions: Fall Restrictions Weight Bearing Restrictions: No      Mobility  Bed Mobility Overal bed mobility: Needs Assistance Bed Mobility: Supine to Sit     Supine to sit: Mod assist     General bed mobility comments: assist to pivot hips and guide LEs off bed     Transfers Overall transfer level: Needs assistance Equipment used: 2 person hand held assist Transfers: Sit to/from Stand Sit to Stand: Min assist, +2 safety/equipment           General transfer comment: assist to wt shift to standing, multi-modal cues for participation    Ambulation/Gait Ambulation/Gait assistance: Min assist Gait Distance (Feet): 20 Feet Assistive device: 2 person hand held assist Gait Pattern/deviations: Step-through pattern, Decreased stride length       General Gait Details: HHA to steady, multi-modal cues for direction and progression of activity  Stairs            Wheelchair Mobility     Tilt Bed    Modified Rankin (Stroke Patients Only)       Balance Overall balance assessment: Needs assistance Sitting-balance support: No upper extremity supported, Feet supported Sitting balance-Leahy Scale: Fair Sitting balance - Comments: at least fair   Standing balance support: During functional activity, Single extremity supported, Bilateral upper extremity supported Standing balance-Leahy Scale: Poor Standing balance comment: light HHA/unilateral UE assist for balance                             Pertinent Vitals/Pain Pain Assessment Pain Assessment: Faces Faces Pain Scale: No hurt    Home Living Family/patient expects to be discharged to:: Private residence Living Arrangements: Children Available Help at Discharge: Family             Home Equipment: None      Prior Function Prior Level of Function : Needs assist  Cognitive Assist : ADLs (cognitive);Mobility (cognitive) Mobility (Cognitive): Set up cues ADLs (Cognitive):  (level of cues  needed varies per dtr)               Extremity/Trunk Assessment   Upper Extremity Assessment Upper Extremity Assessment: Generalized weakness    Lower Extremity Assessment Lower Extremity Assessment: Generalized weakness       Communication    Communication Communication: Difficulty following commands/understanding Following commands: Follows one step commands inconsistently Cueing Techniques: Verbal cues;Tactile cues;Visual cues  Cognition Arousal: Alert Behavior During Therapy: Restless Overall Cognitive Status: History of cognitive impairments - at baseline                                 General Comments: with multi-modal cues pt cooperates with mobility        General Comments      Exercises     Assessment/Plan    PT Assessment Patient needs continued PT services  PT Problem List Decreased strength;Decreased activity tolerance;Decreased balance;Decreased mobility;Decreased knowledge of precautions;Decreased cognition       PT Treatment Interventions Gait training;Functional mobility training;Therapeutic activities;Therapeutic exercise;Patient/family education    PT Goals (Current goals can be found in the Care Plan section)  Acute Rehab PT Goals Patient Stated Goal: rehab with eventual memory care PT Goal Formulation: With patient/family Time For Goal Achievement: 09/28/23 Potential to Achieve Goals: Fair    Frequency Min 1X/week     Co-evaluation               AM-PAC PT "6 Clicks" Mobility  Outcome Measure Help needed turning from your back to your side while in a flat bed without using bedrails?: A Little Help needed moving from lying on your back to sitting on the side of a flat bed without using bedrails?: A Little Help needed moving to and from a bed to a chair (including a wheelchair)?: A Little Help needed standing up from a chair using your arms (e.g., wheelchair or bedside chair)?: A Little Help needed to walk in hospital room?: A Little Help needed climbing 3-5 steps with a railing? : A Lot 6 Click Score: 17    End of Session Equipment Utilized During Treatment: Gait belt Activity Tolerance: Patient tolerated treatment well Patient left: with call bell/phone within  reach;with chair alarm set;in chair;with family/visitor present Nurse Communication: Mobility status PT Visit Diagnosis: Other abnormalities of gait and mobility (R26.89);Difficulty in walking, not elsewhere classified (R26.2)    Time: 4259-5638 PT Time Calculation (min) (ACUTE ONLY): 13 min   Charges:   PT Evaluation $PT Eval Low Complexity: 1 Low   PT General Charges $$ ACUTE PT VISIT: 1 Visit         Deadrian Toya, PT  Acute Rehab Dept [facility]) [phone]  09/14/2023   [facility] 09/14/2023, 2:55 PM

## 2023-09-14 NOTE — Plan of Care (Signed)
  Problem: Activity: Goal: Risk for activity intolerance will decrease Outcome: Progressing   Problem: Nutrition: Goal: Adequate nutrition will be maintained Outcome: Progressing   Problem: Safety: Goal: Ability to remain free from injury will improve Outcome: Progressing   Problem: Pain Management: Goal: General experience of comfort will improve Outcome: Progressing

## 2023-09-14 NOTE — Progress Notes (Signed)
[facility] Progress Note Patient: Darlene Ballard GNF:[id number] DOB: [birth year] DOA: 09/13/2023  DOS: the patient was seen and examined on 09/14/2023  Brief hospital course: PMH of dementia, hypothyroidism, anxiety, unintentional weight loss likely failure to thrive presented to the hospital with complaints of diarrhea. 07/23/2023 seen by urology.  Urine cultures were sent. Urine culture grew 10,000 E. coli.  Not significant amount. 11/16 seen in the ED for agitation and confusion.  Was given oral Keflex for possible UTI. 11/18 starts having diarrhea 12/1 brought to the ED for diarrhea.  Assessment and Plan: Diarrhea. So far no episode of diarrhea here in the hospital. CT abdomen negative for any acute abnormality. Given the duration of the diarrhea her serum creatinine has been remarkably normal as well as BUN is normal. Patient does not appear clinically dehydrated. C. difficile negative.  GI pathogen panel negative. For now we will use Imodium for diarrhea control.  Consider dehydration. Ruled out. Patient is adequately hydrated after overnight fluids. Will monitor without IV hydration.  Hypokalemia. Treated with oral potassium supplements. Likely from poor p.o. intake rather than GI losses. Monitor blood  Hypothyroidism. Continue Synthroid. TSH is suppressed.  Free T4 is normal. If diarrhea continues or agitation continues would recommend to lower the Synthroid from 88 mcg to 75 mcg but for now we will continue 88.  Dementia with behavioral issues. Appears to be close to baseline for her memory issues. Has seen neurology in September. Currently on Prozac 10 mg. Unable to tolerate olanzapine. Rexulti was recommended but not prescribed. For now no uncontrolled behaviors seen in the hospital.  Concern for UTI. Patient was started on IV antibiotic thinking that she actually has some UTI. At the time of my evaluation the patient does not appear to have any evidence of  infection. No leukocytosis, no fever, no tachycardia, no hypotension. No evidence of dehydration. Urinalysis unremarkable. Urine culture currently pending. Would not continue antibiotic as most likely the antibiotic or cause of her diarrhea.  Subjective: No nausea no vomiting.  Confused.  Would not allow examination.  Oral intake is adequate.  Taking medications.  No diarrhea.  Physical Exam: Alert and awake.  No distress.  Appears cachectic. Calm.   Data Reviewed: I have Reviewed nursing notes, Vitals, and Lab results. Since last encounter, pertinent lab results CBC and BMP   . I have ordered test including BMP  .   Disposition: Status is: Inpatient Remains inpatient appropriate because: Monitor for diarrhea overnight.  enoxaparin (LOVENOX) injection 40 mg Start: 09/13/23 2200 Place TED hose Start: 09/13/23 1522   Family Communication: Discussed with daughter at bedside Level of care: Med-Surg   Vitals:   09/13/23 1401 09/13/23 1639 09/13/23 2025 09/14/23 0522  BP: 102/87 (!) 142/78 138/78 (!) 149/83  Pulse: 60 (!) 56 63 73  Resp: 13 18 16 18   Temp: 97.9 F (36.6 C) 98.1 F (36.7 C) (!) 97.5 F (36.4 C) 97.8 F (36.6 C)  TempSrc:  Oral Oral Oral  SpO2: 90% 100% 100% 100%  Weight:      Height:         Author: Lynden Oxford, MD 09/14/2023 5:37 PM  Please look on [email or web address] to find out who is on call.

## 2023-09-15 DIAGNOSIS — G9349 Other encephalopathy: Secondary | ICD-10-CM | POA: Diagnosis not present

## 2023-09-15 DIAGNOSIS — B999 Unspecified infectious disease: Secondary | ICD-10-CM | POA: Diagnosis not present

## 2023-09-15 LAB — BASIC METABOLIC PANEL
Anion gap: 7 (ref 5–15)
BUN: 6 mg/dL — ABNORMAL LOW (ref 8–23)
CO2: 28 mmol/L (ref 22–32)
Calcium: 8.8 mg/dL — ABNORMAL LOW (ref 8.9–10.3)
Chloride: 103 mmol/L (ref 98–111)
Creatinine, Ser: 0.48 mg/dL (ref 0.44–1.00)
GFR, Estimated: >60 mL/min (ref >60)
Glucose, Bld: 82 mg/dL (ref 70–99)
Potassium: 3.1 mmol/L — ABNORMAL LOW (ref 3.5–5.1)
Sodium: 138 mmol/L (ref 135–145)

## 2023-09-15 LAB — MAGNESIUM: Magnesium: 1.9 mg/dL (ref 1.7–2.4)

## 2023-09-15 MED ORDER — POTASSIUM CHLORIDE CRYS ER 20 MEQ PO TBCR
40.0000 meq | EXTENDED_RELEASE_TABLET | Freq: Once | ORAL | Status: AC
  Administered 2023-09-15: 40 meq via ORAL
  Filled 2023-09-15: qty 2

## 2023-09-15 MED ORDER — BREXPIPRAZOLE 0.25 MG PO TABS
0.2500 mg | ORAL_TABLET | Freq: Every day | ORAL | Status: DC
  Administered 2023-09-16 – 2023-09-18 (×3): 0.25 mg via ORAL
  Filled 2023-09-15 (×5): qty 1

## 2023-09-15 MED ORDER — BREXPIPRAZOLE 0.25 MG PO TABS
0.2500 mg | ORAL_TABLET | Freq: Every day | ORAL | Status: DC
  Filled 2023-09-15: qty 1

## 2023-09-15 NOTE — TOC Initial Note (Signed)
Transition of Care [facility]) - Initial/Assessment Note    Patient Details  Name: Darlene Ballard MRN: [id number] Date of Birth: 1946/06/11  Transition [facility]) CM/SW Contact:    Amada Jupiter, LCSW Phone Number: 09/15/2023, 3:05 PM  Clinical Narrative:                  Have spoken with pt's daughter, [name], today to review dc planning needs.  Pt with dementia and only oriented x 1.  Daughter reports that, PTA, pt was living with her and attending [facility] Mon- Fri.  Daughter does work and has been trying to keep this plan in place until pt became sick a few days ago and requiring much more care and unable to attend the Day program.  Therapy has evaluated pt and are currently recommending SNF for rehab to hopefully get pt back to baseline function and be able to resume day program. We have discussed need for insurance to approve SNF coverage and understands this is not guaranteed so back up plans need to be thought through.  Have started insurance auth review today with [employer] and await decision.    Expected Discharge Plan: Home w Home Health Services (vs. SNF) Barriers to Discharge: Continued Medical Work up, English as a second language teacher, SNF Pending bed offer   Patient Goals and CMS Choice Patient states their goals for this hospitalization and ongoing recovery are:: Go home          Expected Discharge Plan and Services In-house Referral: Clinical Social Work     Living arrangements for the past 2 months: Single Family Home                                      Prior Living Arrangements/Services Living arrangements for the past 2 months: Single Family Home Lives with:: Adult Children Patient language and need for interpreter reviewed:: Yes Do you feel safe going back to the place where you live?: Yes      Need for Family Participation in Patient Care: Yes (Comment) Care giver support system in place?: Yes (comment)   Criminal  Activity/Legal Involvement Pertinent to Current Situation/Hospitalization: No - Comment as needed  Activities of Daily Living   ADL Screening (condition at time of admission) Independently performs ADLs?: No Does the patient have a NEW difficulty with bathing/dressing/toileting/self-feeding that is expected to last >3 days?: Yes (Initiates electronic notice to provider for possible OT consult) Does the patient have a NEW difficulty with getting in/out of bed, walking, or climbing stairs that is expected to last >3 days?: No Does the patient have a NEW difficulty with communication that is expected to last >3 days?: Yes (Initiates electronic notice to provider for possible SLP consult) Is the patient deaf or have difficulty hearing?: No Does the patient have difficulty seeing, even when wearing glasses/contacts?: No Does the patient have difficulty concentrating, remembering, or making decisions?: Yes  Permission Sought/Granted Permission sought to share information with : Family Supports Permission granted to share information with : Yes, Verbal Permission Granted  Share Information with NAME: daughter, Collie Siad @ [phone]           Emotional Assessment Appearance:: Appears stated age Attitude/Demeanor/Rapport: Engaged Affect (typically observed): Quiet Orientation: : Oriented to Self Alcohol / Substance Use: Not Applicable Psych Involvement: No (comment)  Admission diagnosis:  Diarrhea of presumed infectious origin [R19.7] Acute cystitis with  hematuria [N30.01] Infectious encephalopathy [G93.49, B99.9] Dementia, unspecified dementia severity, unspecified dementia type, unspecified whether behavioral, psychotic, or mood disturbance or anxiety (HCC) [F03.90] Patient Active Problem List   Diagnosis Date Noted   Infectious encephalopathy 09/13/2023   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   Behavior disturbance 07/02/2023   Sleep disturbance 07/02/2023   Abnormal brain MRI  04/27/2023   Self-care deficit for bathing and hygiene 04/27/2023   Neglected elder 04/27/2023   Encounter for health maintenance examination in adult 06/10/2022   Advance directive discussed with patient 06/10/2022   Vaccine counseling 06/10/2022   Medicare annual wellness visit, subsequent 06/10/2022   Hypothyroidism 06/26/2021   Chronic pain of both knees 02/08/2020   Dementia without behavioral disturbance (HCC) 08/09/2019   Hyperlipidemia 02/01/2019   Anxiety 08/03/2018   Hypertension 08/03/2018   PCP:  Genia Del Pharmacy:   [facility] - [place], [place] - [address] [place] [address] Phone: [phone] Fax: [phone]     Social Determinants of Health (SDOH) Social History: SDOH Screenings   Food Insecurity: No Food Insecurity (09/13/2023)  Housing: Low Risk  (09/13/2023)  Transportation Needs: No Transportation Needs (09/13/2023)  Utilities: Not At Risk (09/13/2023)  Alcohol Screen: Low Risk  (05/19/2023)  Depression (PHQ2-9): Low Risk  (05/19/2023)  Financial Resource Strain: Low Risk  (05/19/2023)  Physical Activity: Sufficiently Active (05/19/2023)  Social Connections: Moderately Isolated (05/19/2023)  Stress: No Stress Concern Present (05/19/2023)  Tobacco Use: Low Risk  (09/13/2023)  Health Literacy: Adequate Health Literacy (05/19/2023)   SDOH Interventions:     Readmission Risk Interventions    09/15/2023    3:03 PM  Readmission Risk Prevention Plan  Transportation Screening Complete  PCP or Specialist Appt within 5-7 Days Complete  Home Care Screening Complete  Medication Review (RN CM) Complete

## 2023-09-15 NOTE — Plan of Care (Signed)
  Problem: Education: Goal: Knowledge of General Education information will improve Description: Including pain rating scale, medication(s)/side effects and non-pharmacologic comfort measures Outcome: Progressing   Problem: Health Behavior/Discharge Planning: Goal: Ability to manage health-related needs will improve Outcome: Progressing   Problem: Clinical Measurements: Goal: Ability to maintain clinical measurements within normal limits will improve Outcome: Progressing   Problem: Activity: Goal: Risk for activity intolerance will decrease Outcome: Progressing   Problem: Nutrition: Goal: Adequate nutrition will be maintained Outcome: Progressing   Problem: Coping: Goal: Level of anxiety will decrease Outcome: Progressing   Problem: Elimination: Goal: Will not experience complications related to bowel motility Outcome: Progressing   Problem: Pain Management: Goal: General experience of comfort will improve Outcome: Progressing   Problem: Safety: Goal: Ability to remain free from injury will improve Outcome: Progressing   Problem: Skin Integrity: Goal: Risk for impaired skin integrity will decrease Outcome: Progressing

## 2023-09-15 NOTE — Progress Notes (Signed)
[facility] Progress Note Patient: Darlene Ballard [id number]:[id number] DOB: 03/11/1946 DOA: 09/13/2023  DOS: the patient was seen and examined on 09/15/2023  Brief hospital course: PMH of dementia, hypothyroidism, anxiety, unintentional weight loss likely failure to thrive presented to the hospital with complaints of diarrhea. 07/23/2023 seen by urology.  Urine cultures were sent. Urine culture grew 10,000 E. coli.  Not significant amount. 11/16 seen in the ED for agitation and confusion.  Was given oral Keflex for possible UTI. 11/18 starts having diarrhea 12/1 brought to the ED for diarrhea. 12/2 so far no diarrhea in the hospital.  Somewhat agitated but close to baseline.  Medically stable. 12/3 so far no diarrhea.  Medically stable.  Awaiting placement.  Assessment and Plan: Diarrhea. So far no episode of diarrhea here in the hospital. CT abdomen negative for any acute abnormality. Given the duration of the diarrhea her serum creatinine has been remarkably normal as well as BUN is normal. Patient does not appear clinically dehydrated. C. difficile negative.  GI pathogen panel negative. For now we will use Imodium for diarrhea control.  Concern for dehydration. Ruled out. Patient is adequately hydrated after overnight fluids. Will monitor without IV hydration.  Hypokalemia. Treated with oral potassium supplements. Likely from poor p.o. intake rather than GI losses.  Hypothyroidism. Continue Synthroid. TSH is suppressed.  Free T4 is normal. If diarrhea continues or agitation continues would recommend to lower the Synthroid from 88 mcg to 75 mcg but for now we will continue 88.  Dementia with behavioral issues. Appears to be close to baseline for her memory issues. Has seen neurology in September. Currently on Prozac 10 mg. Unable to tolerate olanzapine. Rexulti was recommended but not prescribed.  I will prescribe that medication. For now no uncontrolled behaviors seen in the  hospital.  Concern for UTI. Patient was started on IV antibiotic thinking that she actually has some UTI. At the time of my evaluation the patient does not appear to have any evidence of infection. No leukocytosis, no fever, no tachycardia, no hypotension. No evidence of dehydration. Urinalysis unremarkable. Urine culture currently pending. Would not continue antibiotic as most likely the antibiotic or cause of her diarrhea.  Subjective: Patient is pleasant.  No nausea no vomiting.  Had some of the agitation issues overnight although unsure of the etiology.  With possible sundowning.  Physical Exam: Alert and awake.  Oriented to self. Alert examination.  Bowel sound present.  S1-S2 present.  Clear to auscultation.  Nontender abdomen examination.  Trace lower extremity edema.  No focal deficit.  Data Reviewed: I have Reviewed nursing notes, Vitals, and Lab results. Reviewed CBC and CMP.  Disposition: Status is: Inpatient Remains inpatient appropriate because: Medically stable.  Awaiting transfer.  enoxaparin (LOVENOX) injection 40 mg Start: 09/13/23 2200 Place TED hose Start: 09/13/23 1522   Family Communication: Discussed with daughter at bedside on 12/2.  No one at bedside. Level of care: Med-Surg   Vitals:   09/15/23 0530 09/15/23 0916 09/15/23 0925 09/15/23 1349  BP: (!) 152/86 (!) 155/89 (!) 142/82 (!) 149/83  Pulse: 73 71 72 73  Resp: 16 19 18 19   Temp: 98.5 F (36.9 C) 98.3 F (36.8 C) 97.8 F (36.6 C) 98.1 F (36.7 C)  TempSrc: Oral  Oral Oral  SpO2: 100% 99% 100% 98%  Weight:      Height:         Author: Lynden Oxford, MD 09/15/2023 7:42 PM  Please look on [email or web address] to find out who  is on call.

## 2023-09-15 NOTE — Progress Notes (Signed)
Physical Therapy Treatment Patient Details Name: Darlene Ballard MRN: [id number] DOB: 1945-11-04 Today's Date: 09/15/2023   History of Present Illness 76 y.o. female who presents to [facility] due to diarrhea.  C difficile negative.  medical history significant of anxiety, dementia, hypothyroidism    PT Comments  Pt calm, agreeable to PT at this time, cooperative throughout session. Pt requires multi-modal cues for sequencing, progression of task and overall safety awareness during activity. D/c plan remains appropriate at this time. Will continue to follow in acute setting.    If plan is discharge home, recommend the following: Help with stairs or ramp for entrance;Assist for transportation;Assistance with cooking/housework;A little help with bathing/dressing/bathroom;A little help with walking and/or transfers;Direct supervision/assist for financial management;Supervision due to cognitive status;Direct supervision/assist for medications management   Can travel by private vehicle     Yes  Equipment Recommendations  None recommended by PT    Recommendations for Other Services       Precautions / Restrictions Precautions Precautions: Fall Restrictions Weight Bearing Restrictions: No     Mobility  Bed Mobility Overal bed mobility: Needs Assistance Bed Mobility: Supine to Sit, Sit to Supine     Supine to sit: Contact guard, Min assist Sit to supine: Supervision   General bed mobility comments: HHA to guide trunk to upright/EOB. pt is able to self assist with multi-modal cues    Transfers Overall transfer level: Needs assistance Equipment used: 1 person hand held assist Transfers: Sit to/from Stand Sit to Stand: Min assist           General transfer comment: assist to wt shift to standing, multi-modal cues for participation; pt with wide BOS, initial posterior lean, able to correct with multi-modal cues    Ambulation/Gait Ambulation/Gait assistance: Min assist, Contact guard  assist Gait Distance (Feet):  (hallway ambulation) Assistive device: None, 1 person hand held assist Gait Pattern/deviations: Step-through pattern, Decreased stride length       General Gait Details: HHA to steady initially, multi-modal cues for direction and progression of activity   Stairs             Wheelchair Mobility     Tilt Bed    Modified Rankin (Stroke Patients Only)       Balance   Sitting-balance support: No upper extremity supported, Feet supported Sitting balance-Leahy Scale: Fair Sitting balance - Comments: at least fair   Standing balance support: No upper extremity supported, During functional activity, Single extremity supported   Standing balance comment: initial assist needed for static standing; with incr time pt progressed to fair balance                            Cognition Arousal: Alert Behavior During Therapy: WFL for tasks assessed/performed Overall Cognitive Status: History of cognitive impairments - at baseline                                 General Comments: with multi-modal cues pt cooperates with mobility; mood pleasant and calm today        Exercises      General Comments        Pertinent Vitals/Pain Pain Assessment Pain Assessment: No/denies pain Faces Pain Scale: No hurt    Home Living  Prior Function            PT Goals (current goals can now be found in the care plan section) Acute Rehab PT Goals Patient Stated Goal: rehab with eventual memory care PT Goal Formulation: With patient/family Time For Goal Achievement: 09/28/23 Potential to Achieve Goals: Good Progress towards PT goals: Progressing toward goals    Frequency    Min 1X/week      PT Plan      Co-evaluation              AM-PAC PT "6 Clicks" Mobility   Outcome Measure  Help needed turning from your back to your side while in a flat bed without using bedrails?: A  Little Help needed moving from lying on your back to sitting on the side of a flat bed without using bedrails?: A Little Help needed moving to and from a bed to a chair (including a wheelchair)?: A Little Help needed standing up from a chair using your arms (e.g., wheelchair or bedside chair)?: A Little Help needed to walk in hospital room?: A Little Help needed climbing 3-5 steps with a railing? : A Little 6 Click Score: 18    End of Session Equipment Utilized During Treatment: Gait belt Activity Tolerance: Patient tolerated treatment well Patient left: with call bell/phone within reach;in bed;with bed alarm set   PT Visit Diagnosis: Other abnormalities of gait and mobility (R26.89);Difficulty in walking, not elsewhere classified (R26.2)     Time: 4742-5956 PT Time Calculation (min) (ACUTE ONLY): 18 min  Charges:    $Gait Training: 8-22 mins PT General Charges $$ ACUTE PT VISIT: 1 Visit                     Lequita Meadowcroft, PT  Acute Rehab Dept [facility]) [phone]  09/15/2023    [facility] 09/15/2023, 12:41 PM

## 2023-09-15 NOTE — Progress Notes (Signed)
Occupational Therapy Evaluation Patient Details Name: Darlene Ballard MRN: [id number] DOB: 1946-02-27 Today's Date: 09/15/2023   History of Present Illness 77 y.o. female who presents to [facility] due to diarrhea.  C difficile negative.  medical history significant of anxiety, dementia, hypothyroidism   Clinical Impression   Pt poor historian, unable to provide PLOF or home setup. Info from chart. Pt benefits from multimodal cuing, gentle redirection to task, often petting/kissing her stuffed dog, telling OT that her dog was "scared" of new people. Pleasant, and cooperative.  Pt performs bed mobility and transfers with hand held assist, completes bout of mobility in room CGA, washes face and dons/doffs socks with min multimodal cuing for sequencing.  Pt would benefit from skilled OT services to address noted impairments and functional limitations (see below for any additional details) in order to maximize safety and independence while minimizing falls risk and caregiver burden. Given pt's cognitive deficits, she will require 24/7 supervision at hospital discharge. Patient will benefit from continued inpatient follow up therapy, <3 hours/day       If plan is discharge home, recommend the following: A little help with walking and/or transfers;A little help with bathing/dressing/bathroom;Assistance with cooking/housework;Direct supervision/assist for medications management;Direct supervision/assist for financial management;Assist for transportation;Help with stairs or ramp for entrance;Supervision due to cognitive status    Functional Status Assessment  Patient has had a recent decline in their functional status and demonstrates the ability to make significant improvements in function in a reasonable and predictable amount of time.  Equipment Recommendations  None recommended by OT       Precautions / Restrictions Precautions Precautions: Fall Restrictions Weight Bearing Restrictions: No       Mobility Bed Mobility Overal bed mobility: Needs Assistance Bed Mobility: Supine to Sit, Sit to Supine     Supine to sit: Contact guard Sit to supine: Contact guard assist   General bed mobility comments: HHA to assist transition to EOB    Transfers Overall transfer level: Needs assistance Equipment used: 1 person hand held assist Transfers: Sit to/from Stand, Bed to chair/wheelchair/BSC Sit to Stand: Contact guard assist     Step pivot transfers: Contact guard assist            Balance Overall balance assessment: Needs assistance Sitting-balance support: No upper extremity supported, Feet supported Sitting balance-Leahy Scale: Fair     Standing balance support: No upper extremity supported, During functional activity, Single extremity supported Standing balance-Leahy Scale: Fair Standing balance comment: no physical assist needed to stand or maintain static standing balance                           ADL either performed or assessed with clinical judgement   ADL Overall ADL's : Needs assistance/impaired Eating/Feeding: Set up;Sitting   Grooming: Sitting;Cueing for sequencing;Set up;Wash/dry face Grooming Details (indicate cue type and reason): sitting EOB to wash face             Lower Body Dressing: Cueing for sequencing Lower Body Dressing Details (indicate cue type and reason): dons/doffs socks sitting EOB; 1 vc for sequencing, no LOB Toilet Transfer: Contact guard assist;Ambulation;Regular Toilet;Grab bars Toilet Transfer Details (indicate cue type and reason): HHA with CGA Toileting- Clothing Manipulation and Hygiene: Contact guard assist;Cueing for sequencing;Sit to/from stand;Sitting/lateral lean       Functional mobility during ADLs: Contact guard assist (hand held assist) General ADL Comments: Pt likely close to baseline for ADL performance, needs cues for sequencing  and hand held assist for mobility. CGA level for tasks assessed.       Pertinent Vitals/Pain Pain Assessment Pain Assessment: No/denies pain Faces Pain Scale: No hurt     Extremity/Trunk Assessment Upper Extremity Assessment Upper Extremity Assessment: Generalized weakness   Lower Extremity Assessment Lower Extremity Assessment: Defer to PT evaluation       Communication Communication Communication: Difficulty following commands/understanding Following commands: Follows one step commands consistently;Follows multi-step commands with increased time Cueing Techniques: Verbal cues;Tactile cues;Visual cues   Cognition Arousal: Alert Behavior During Therapy: WFL for tasks assessed/performed Overall Cognitive Status: History of cognitive impairments - at baseline                                 General Comments: with multi-modal cues pt cooperates with mobility; mood pleasant and calm today                Home Living Family/patient expects to be discharged to:: Private residence Living Arrangements: Children Available Help at Discharge: Family                         Home Equipment: None   Additional Comments: pt poor historian; unable to state home setup      Prior Functioning/Environment Prior Level of Function : Needs assist  Cognitive Assist : ADLs (cognitive);Mobility (cognitive) Mobility (Cognitive): Set up cues ADLs (Cognitive): Set up cues         ADLs Comments: pt unable to state PLOF, info from chart review        OT Problem List: Decreased strength;Decreased activity tolerance;Impaired balance (sitting and/or standing);Decreased cognition;Decreased safety awareness;Decreased knowledge of use of DME or AE      OT Treatment/Interventions: Self-care/ADL training;Therapeutic exercise;DME and/or AE instruction;Therapeutic activities;Cognitive remediation/compensation;Patient/family education;Balance training    OT Goals(Current goals can be found in the care plan section) Acute Rehab OT  Goals Patient Stated Goal: unable to state OT Goal Formulation: Patient unable to participate in goal setting Potential to Achieve Goals: Fair  OT Frequency: Min 1X/week       AM-PAC OT "6 Clicks" Daily Activity     Outcome Measure Help from another person eating meals?: A Little Help from another person taking care of personal grooming?: A Little Help from another person toileting, which includes using toliet, bedpan, or urinal?: A Little Help from another person bathing (including washing, rinsing, drying)?: A Little Help from another person to put on and taking off regular upper body clothing?: A Little Help from another person to put on and taking off regular lower body clothing?: A Little 6 Click Score: 18   End of Session Nurse Communication: Mobility status  Activity Tolerance: Patient tolerated treatment well Patient left: in bed;with call bell/phone within reach;with bed alarm set  OT Visit Diagnosis: Adult, failure to thrive (R62.7);Muscle weakness (generalized) (M62.81);Unsteadiness on feet (R26.81);Other symptoms and signs involving cognitive function                Time: 7829-5621 OT Time Calculation (min): 12 min Charges:  OT General Charges $OT Visit: 1 Visit OT Evaluation $OT Eval Low Complexity: 1 Low  Maddy Graham L. Anasha Perfecto, OTR/L  09/15/23, 1:11 PM

## 2023-09-16 DIAGNOSIS — R197 Diarrhea, unspecified: Secondary | ICD-10-CM | POA: Diagnosis not present

## 2023-09-16 LAB — URINE CULTURE: Culture: 60000 — AB

## 2023-09-16 MED ORDER — LOPERAMIDE HCL 2 MG PO CAPS: 4.0000 mg | ORAL_CAPSULE | ORAL | Status: DC | PRN

## 2023-09-16 MED ORDER — POTASSIUM CHLORIDE CRYS ER 20 MEQ PO TBCR
40.0000 meq | EXTENDED_RELEASE_TABLET | Freq: Once | ORAL | Status: AC
  Administered 2023-09-16: 40 meq via ORAL
  Filled 2023-09-16: qty 2

## 2023-09-16 MED ORDER — ACETAMINOPHEN 325 MG PO TABS: 650.0000 mg | ORAL_TABLET | Freq: Four times a day (QID) | ORAL | Status: DC | PRN

## 2023-09-16 MED ORDER — LOPERAMIDE HCL 2 MG PO CAPS: 4.0000 mg | ORAL_CAPSULE | Freq: Three times a day (TID) | ORAL | Status: DC | PRN

## 2023-09-16 MED ORDER — SACCHAROMYCES BOULARDII 250 MG PO CAPS
250.0000 mg | ORAL_CAPSULE | Freq: Two times a day (BID) | ORAL | Status: DC
  Administered 2023-09-16 – 2023-09-24 (×17): 250 mg via ORAL
  Filled 2023-09-16 (×17): qty 1

## 2023-09-16 MED ORDER — BREXPIPRAZOLE 0.25 MG PO TABS: 0.2500 mg | ORAL_TABLET | Freq: Every day | ORAL | Status: DC

## 2023-09-16 NOTE — Discharge Summary (Signed)
[facility]  Physician Discharge Summary   Patient ID: Darlene Ballard MRN: [id number] DOB/AGE: 21-Mar-1946 77 y.o.  Admit date: 09/13/2023 Discharge date:   09/16/2023   PCP: Jac Canavan, PA-C  DISCHARGE DIAGNOSES:  Acute diarrhea, resolved Dementia with behavioral issues Hypokalemia Hypothyroidism   RECOMMENDATIONS FOR OUTPATIENT FOLLOW UP: Check CBC and basic metabolic panel and magnesium in 3 to 4 days    Home Health: SNF Equipment/Devices: None  CODE STATUS: Full code  DISCHARGE CONDITION: fair  Diet recommendation: Regular as tolerated  INITIAL HISTORY: 77 year old female with past medical history of dementia, hypothyroidism, anxiety presented with diarrhea.  Concern for UTI was also present on admission.  She was given IV fluids.  Given antimotility agents after infection was ruled out.     HOSPITAL COURSE:   Diarrhea. C. difficile testing was negative.  GI pathogen panel was negative.  CT scan of the abdomen did not show any acute findings.  She was given Imodium with improvement in diarrhea.  Thought to be secondary to antibiotics.     Hypokalemia. Likely from GI loss as well as poor oral intake.  Continue to supplement.  Recheck labs tomorrow if still here in the hospital.  If not can be rechecked in 3 to 4 days at the nursing facility.     Hypothyroidism. Continue Synthroid. TSH is suppressed.  Free T4 is normal.   Dementia with behavioral issues. Appears to be close to baseline for her memory issues. Has seen neurology in September. Currently on Prozac 10 mg. Unable to tolerate olanzapine. Rexulti was recommended but not prescribed.  This has been initiated.   Behavior has been well-controlled here in the hospital.   Concern for UTI. Patient was started on IV antibiotic thinking that she actually has some UTI.  Subsequent rounding physicians did not think that patient had active UTI. Antibiotics were thought to be responsible for her  diarrhea so they were discontinued.  Urine culture is growing 60,000 colonies of Pseudomonas.  WBC is normal.  She is afebrile.  No clear indication to continue with antibiotics at this time.  Patient is stable.  Okay for discharge to SNF when bed is available.  PERTINENT LABS:  The results of significant diagnostics from this hospitalization (including imaging, microbiology, ancillary and laboratory) are listed below for reference.    Microbiology: Recent Results (from the past 240 hour(s))  C Difficile Quick Screen w PCR reflex     Status: None   Collection Time: 09/13/23  9:39 AM   Specimen: STOOL  Result Value Ref Range Status   C Diff antigen NEGATIVE NEGATIVE Final   C Diff toxin NEGATIVE NEGATIVE Final   C Diff interpretation No C. difficile detected.  Final    Comment: Performed at [facility], [address], [place], [place] [address]  Gastrointestinal Panel by PCR , Stool     Status: None   Collection Time: 09/13/23  9:39 AM   Specimen: Stool  Result Value Ref Range Status   Campylobacter species NOT DETECTED NOT DETECTED Final   Plesimonas shigelloides NOT DETECTED NOT DETECTED Final   Salmonella species NOT DETECTED NOT DETECTED Final   Yersinia enterocolitica NOT DETECTED NOT DETECTED Final   Vibrio species NOT DETECTED NOT DETECTED Final   Vibrio cholerae NOT DETECTED NOT DETECTED Final   Enteroaggregative E coli (EAEC) NOT DETECTED NOT DETECTED Final   Enteropathogenic E coli (EPEC) NOT DETECTED NOT DETECTED Final   Enterotoxigenic E coli (ETEC) NOT DETECTED  NOT DETECTED Final   Shiga like toxin producing E coli (STEC) NOT DETECTED NOT DETECTED Final   Shigella/Enteroinvasive E coli (EIEC) NOT DETECTED NOT DETECTED Final   Cryptosporidium NOT DETECTED NOT DETECTED Final   Cyclospora cayetanensis NOT DETECTED NOT DETECTED Final   Entamoeba histolytica NOT DETECTED NOT DETECTED Final   Giardia lamblia NOT DETECTED NOT DETECTED Final   Adenovirus  F40/41 NOT DETECTED NOT DETECTED Final   Astrovirus NOT DETECTED NOT DETECTED Final   Norovirus GI/GII NOT DETECTED NOT DETECTED Final   Rotavirus A NOT DETECTED NOT DETECTED Final   Sapovirus (I, II, IV, and V) NOT DETECTED NOT DETECTED Final    Comment: Performed at [facility], [address] [place], [place] [address]  Urine Culture     Status: Abnormal (Preliminary result)   Collection Time: 09/13/23  8:08 PM   Specimen: Urine, Clean Catch  Result Value Ref Range Status   Specimen Description   Final    URINE, CLEAN CATCH Performed at [facility], [address], [place], [place] [address]    Special Requests   Final    NONE Performed at [facility], [address] [place], [place] [address]    Culture (A)  Final    60,000 COLONIES/mL PSEUDOMONAS AERUGINOSA SUSCEPTIBILITIES TO FOLLOW Performed at [facility], [address], [place], [place] [address]    Report Status PENDING  Incomplete     Labs:   Basic Metabolic Panel: Recent Labs  Lab 09/13/23 0849 09/14/23 0317 09/15/23 0935  NA 137 137 138  K 3.3* 3.1* 3.1*  CL 103 103 103  CO2 27 25 28   GLUCOSE 82 116* 82  BUN 14 9 6*  CREATININE 0.53 0.56 0.48  CALCIUM 8.6* 8.3* 8.8*  MG  --   --  1.9   Liver Function Tests: Recent Labs  Lab 09/13/23 0849  AST 22  ALT 12  ALKPHOS 78  BILITOT 0.8  PROT 7.4  ALBUMIN 4.3   Recent Labs  Lab 09/13/23 0849  LIPASE 36    CBC: Recent Labs  Lab 09/13/23 0849 09/14/23 0317  WBC 6.5 7.2  NEUTROABS 5.2  --   HGB 14.8 13.7  HCT 43.4 41.8  MCV 94.3 96.5  PLT 155 160     IMAGING STUDIES CT ABDOMEN PELVIS W CONTRAST  Result Date: 09/13/2023 CLINICAL DATA:  Abdominal pain and diarrhea for approximately 2 weeks. EXAM: CT ABDOMEN AND PELVIS WITH CONTRAST TECHNIQUE: Multidetector CT imaging of the abdomen and pelvis was performed using the standard protocol following bolus administration of intravenous  contrast. RADIATION DOSE REDUCTION: This exam was performed according to the departmental dose-optimization program which includes automated exposure control, adjustment of the mA and/or kV according to patient size and/or use of iterative reconstruction technique. CONTRAST:  OMNIPAQUE IOHEXOL 300 MG/ML  SOLN COMPARISON:  06/17/2023 FINDINGS: Lower Chest: No acute findings. Previously seen left pleural effusion has resolved. Hepatobiliary: No suspicious hepatic masses identified. Gallbladder is unremarkable. No evidence of biliary ductal dilatation. Pancreas:  No mass or inflammatory changes. Spleen: Within normal limits in size and appearance. Adrenals/Urinary Tract: No suspicious masses identified. No evidence of ureteral calculi or hydronephrosis. Stomach/Bowel: Small to moderate hiatal hernia is again seen. No evidence of obstruction, bowel wall thickening or other inflammatory process, or abnormal fluid collections. Vascular/Lymphatic: No pathologically enlarged lymph nodes. No acute vascular findings. Reproductive: Several small calcified uterine fibroids are again noted, largest measuring 1.6 cm. A  2.4 cm benign-appearing cyst is again seen in the right adnexa, which remains stable. Other:  None. Musculoskeletal: No suspicious bone lesions identified. Mild wedge compression deformity of the T12 vertebral body is again noted. IMPRESSION: No acute findings. Small to moderate hiatal hernia. Small calcified uterine fibroids. Stable 2.4 cm benign-appearing right adnexal cyst. No follow-up imaging recommended. Reference: JACR 2020 Feb; 17(2):248-254 Electronically Signed   By: Danae Orleans M.D.   On: 09/13/2023 10:12    DISCHARGE EXAMINATION: Vitals:   09/15/23 0925 09/15/23 1349 09/15/23 2230 09/16/23 0556  BP: (!) 142/82 (!) 149/83 138/88 (!) 139/91  Pulse: 72 73 73 67  Resp: 18 19 18 18   Temp: 97.8 F (36.6 C) 98.1 F (36.7 C) 98.2 F (36.8 C) 98.7 F (37.1 C)  TempSrc: Oral Oral  Oral  SpO2:  100% 98% 98% 97%  Weight:      Height:       General appearance: Awake alert.  In no distress.  Pleasantly confused Resp: Clear to auscultation bilaterally.  Normal effort Cardio: S1-S2 is normal regular.  No S3-S4.  No rubs murmurs or bruit GI: Abdomen is soft.  Nontender nondistended.  Bowel sounds are present normal.  No masses organomegaly   DISPOSITION: SNF  Discharge Instructions     Call MD for:  difficulty breathing, headache or visual disturbances   Complete by: As directed    Call MD for:  extreme fatigue   Complete by: As directed    Call MD for:  persistant dizziness or light-headedness   Complete by: As directed    Call MD for:  persistant nausea and vomiting   Complete by: As directed    Call MD for:  severe uncontrolled pain   Complete by: As directed    Call MD for:  temperature >100.4   Complete by: As directed    Diet general   Complete by: As directed    Discharge instructions   Complete by: As directed    Please be sure to follow-up with your primary care provider.  Take your medications as prescribed.  You were cared for by a hospitalist during your hospital stay. If you have any questions about your discharge medications or the care you received while you were in the hospital after you are discharged, you can call the unit and asked to speak with the hospitalist on call if the hospitalist that took care of you is not available. Once you are discharged, your primary care physician will handle any further medical issues. Please note that NO REFILLS for any discharge medications will be authorized once you are discharged, as it is imperative that you return to your primary care physician (or establish a relationship with a primary care physician if you do not have one) for your aftercare needs so that they can reassess your need for medications and monitor your lab values. If you do not have a primary care physician, you can call [phone] for a physician referral.    Increase activity slowly   Complete by: As directed          Allergies as of 09/16/2023       Reactions   Yellow Jacket Venom Anaphylaxis, Swelling   Paxil [paroxetine] Other (See Comments)   "Anger issues"   Zoloft [sertraline] Other (See Comments)   "Anger issues"        Medication List     STOP taking these medications    cephALEXin 500 MG capsule Commonly known as: KEFLEX  TAKE these medications    acetaminophen 325 MG tablet Commonly known as: TYLENOL Take 2 tablets (650 mg total) by mouth every 6 (six) hours as needed for mild pain (pain score 1-3) (or Fever >/= 101).   brexpiprazole 0.25 MG Tabs tablet Commonly known as: REXULTI Take 1 tablet (0.25 mg total) by mouth daily.   FLUoxetine 10 MG capsule Commonly known as: PROZAC TAKE 1 CAPSULE (10 MG TOTAL) BY MOUTH DAILY.   levothyroxine 88 MCG tablet Commonly known as: SYNTHROID TAKE 1 TABLET (88 MCG TOTAL) BY MOUTH DAILY.   loperamide 2 MG capsule Commonly known as: IMODIUM Take 2 capsules (4 mg total) by mouth as needed for diarrhea or loose stools.   melatonin 5 MG Tabs Take 1 tablet (5 mg total) by mouth at bedtime.          Follow-up Information     Tysinger, Kermit Balo, PA-C. Schedule an appointment as soon as possible for a visit in 1 week(s).   Specialty: Family Medicine Why: post hospitalization follow up Contact information: [address] [place] [address] [phone]                 TOTAL DISCHARGE TIME: 35 minutes  Imari Reen Rito Ehrlich  [facility] Pager on [email or web address]  09/16/2023, 11:34 AM

## 2023-09-17 ENCOUNTER — Telehealth: Payer: Self-pay | Admitting: Medical

## 2023-09-17 DIAGNOSIS — R197 Diarrhea, unspecified: Secondary | ICD-10-CM | POA: Diagnosis not present

## 2023-09-17 LAB — BASIC METABOLIC PANEL
Anion gap: 8 (ref 5–15)
BUN: 18 mg/dL (ref 8–23)
CO2: 27 mmol/L (ref 22–32)
Calcium: 8.9 mg/dL (ref 8.9–10.3)
Chloride: 105 mmol/L (ref 98–111)
Creatinine, Ser: 0.57 mg/dL (ref 0.44–1.00)
GFR, Estimated: >60 mL/min (ref >60)
Glucose, Bld: 89 mg/dL (ref 70–99)
Potassium: 3.5 mmol/L (ref 3.5–5.1)
Sodium: 140 mmol/L (ref 135–145)

## 2023-09-17 LAB — MAGNESIUM: Magnesium: 2 mg/dL (ref 1.7–2.4)

## 2023-09-17 MED ORDER — LOPERAMIDE HCL 2 MG PO CAPS: 4.0000 mg | ORAL_CAPSULE | Freq: Three times a day (TID) | ORAL | Status: DC | PRN

## 2023-09-17 MED ORDER — SACCHAROMYCES BOULARDII 250 MG PO CAPS: 250.0000 mg | ORAL_CAPSULE | Freq: Two times a day (BID) | ORAL | Status: DC

## 2023-09-17 MED ORDER — POTASSIUM CHLORIDE CRYS ER 20 MEQ PO TBCR
60.0000 meq | EXTENDED_RELEASE_TABLET | Freq: Once | ORAL | Status: AC
  Administered 2023-09-17: 60 meq via ORAL
  Filled 2023-09-17: qty 3

## 2023-09-17 NOTE — Telephone Encounter (Signed)
Darlene Ballard, daughter called  She is in hospital and hospital wants to send her home, they will not send her to Rehab only possibly to Memory Care which is private pay  She said since being in hospital, her mom can barely walk and will have to use walker when she does and she was walking fine before she went in   She is trying to get PT involved, she states she can't walk very well  She wants some assistance trying to get help for her mother

## 2023-09-17 NOTE — Plan of Care (Signed)
°  Problem: Nutrition: °Goal: Adequate nutrition will be maintained °Outcome: Progressing °  °Problem: Coping: °Goal: Level of anxiety will decrease °Outcome: Progressing °  °Problem: Safety: °Goal: Ability to remain free from injury will improve °Outcome: Progressing °  °

## 2023-09-17 NOTE — Discharge Summary (Signed)
[facility]  Physician Discharge Summary   Patient ID: Darlene Ballard MRN: [id number] DOB/AGE: April 13, 1946 77 y.o.  Admit date: 09/13/2023 Discharge date:   09/17/2023   PCP: Jac Canavan, PA-C  DISCHARGE DIAGNOSES:  Acute diarrhea, resolved Dementia with behavioral issues Hypokalemia Hypothyroidism   RECOMMENDATIONS FOR OUTPATIENT FOLLOW UP: Check CBC and basic metabolic panel and magnesium in 3 to 4 days    Home Health: SNF Equipment/Devices: None  CODE STATUS: Full code  DISCHARGE CONDITION: fair  Diet recommendation: Regular as tolerated  INITIAL HISTORY: 77 year old female with past medical history of dementia, hypothyroidism, anxiety presented with diarrhea.  Concern for UTI was also present on admission.  She was given IV fluids.  Given antimotility agents after infection was ruled out.     HOSPITAL COURSE:   Diarrhea. C. difficile testing was negative.  GI pathogen panel was negative.  CT scan of the abdomen did not show any acute findings.  She was given Imodium with improvement in diarrhea.  Thought to be secondary to antibiotics.  Started on probiotic as well.   Hypokalemia. Likely from GI loss as well as poor oral intake.  Supplemented.  Will be given additional dose today.  Magnesium 2.0.   Hypothyroidism. Continue Synthroid. TSH is suppressed.  Free T4 is normal.   Dementia with behavioral issues. Appears to be close to baseline for her memory issues. Has seen neurology in September. Currently on Prozac 10 mg. Unable to tolerate olanzapine. Rexulti was recommended but not prescribed.  This has been initiated.   Behavior has been well-controlled here in the hospital.   Concern for UTI. Patient was started on IV antibiotic thinking that she actually has some UTI.  Subsequent rounding physicians did not think that patient had active UTI. Antibiotics were thought to be responsible for her diarrhea so they were discontinued.  Urine culture  is growing 60,000 colonies of Pseudomonas.  WBC is normal.  She is afebrile.  No clear indication to continue with antibiotics at this time.  Patient is stable.  Okay for discharge to SNF when bed is available.  PERTINENT LABS:  The results of significant diagnostics from this hospitalization (including imaging, microbiology, ancillary and laboratory) are listed below for reference.    Microbiology: Recent Results (from the past 240 hour(s))  C Difficile Quick Screen w PCR reflex     Status: None   Collection Time: 09/13/23  9:39 AM   Specimen: STOOL  Result Value Ref Range Status   C Diff antigen NEGATIVE NEGATIVE Final   C Diff toxin NEGATIVE NEGATIVE Final   C Diff interpretation No C. difficile detected.  Final    Comment: Performed at [facility], [address] [place], [place] [address]  Gastrointestinal Panel by PCR , Stool     Status: None   Collection Time: 09/13/23  9:39 AM   Specimen: Stool  Result Value Ref Range Status   Campylobacter species NOT DETECTED NOT DETECTED Final   Plesimonas shigelloides NOT DETECTED NOT DETECTED Final   Salmonella species NOT DETECTED NOT DETECTED Final   Yersinia enterocolitica NOT DETECTED NOT DETECTED Final   Vibrio species NOT DETECTED NOT DETECTED Final   Vibrio cholerae NOT DETECTED NOT DETECTED Final   Enteroaggregative E coli (EAEC) NOT DETECTED NOT DETECTED Final   Enteropathogenic E coli (EPEC) NOT DETECTED NOT DETECTED Final   Enterotoxigenic E coli (ETEC) NOT DETECTED NOT DETECTED Final   Shiga like toxin producing E coli (STEC) NOT DETECTED NOT  DETECTED Final   Shigella/Enteroinvasive E coli (EIEC) NOT DETECTED NOT DETECTED Final   Cryptosporidium NOT DETECTED NOT DETECTED Final   Cyclospora cayetanensis NOT DETECTED NOT DETECTED Final   Entamoeba histolytica NOT DETECTED NOT DETECTED Final   Giardia lamblia NOT DETECTED NOT DETECTED Final   Adenovirus F40/41 NOT DETECTED NOT DETECTED Final    Astrovirus NOT DETECTED NOT DETECTED Final   Norovirus GI/GII NOT DETECTED NOT DETECTED Final   Rotavirus A NOT DETECTED NOT DETECTED Final   Sapovirus (I, II, IV, and V) NOT DETECTED NOT DETECTED Final    Comment: Performed at [facility], [address] [place], [place] [address]  Urine Culture     Status: Abnormal   Collection Time: 09/13/23  8:08 PM   Specimen: Urine, Clean Catch  Result Value Ref Range Status   Specimen Description   Final    URINE, CLEAN CATCH Performed at [facility], [address] [place], [place] [address]    Special Requests   Final    NONE Performed at [facility], [address] [place], [place] [address]    Culture 60,000 COLONIES/mL PSEUDOMONAS AERUGINOSA (A)  Final   Report Status 09/16/2023 FINAL  Final   Organism ID, Bacteria PSEUDOMONAS AERUGINOSA (A)  Final      Susceptibility   Pseudomonas aeruginosa - MIC*    CEFTAZIDIME 2 SENSITIVE Sensitive     CIPROFLOXACIN <=0.25 SENSITIVE Sensitive     GENTAMICIN <=1 SENSITIVE Sensitive     IMIPENEM 2 SENSITIVE Sensitive     PIP/TAZO <=4 SENSITIVE Sensitive ug/mL    CEFEPIME 2 SENSITIVE Sensitive     * 60,000 COLONIES/mL PSEUDOMONAS AERUGINOSA     Labs:   Basic Metabolic Panel: Recent Labs  Lab 09/13/23 0849 09/14/23 0317 09/15/23 0935 09/17/23 0355  NA 137 137 138 140  K 3.3* 3.1* 3.1* 3.5  CL 103 103 103 105  CO2 27 25 28 27   GLUCOSE 82 116* 82 89  BUN 14 9 6* 18  CREATININE 0.53 0.56 0.48 0.57  CALCIUM 8.6* 8.3* 8.8* 8.9  MG  --   --  1.9 2.0   Liver Function Tests: Recent Labs  Lab 09/13/23 0849  AST 22  ALT 12  ALKPHOS 78  BILITOT 0.8  PROT 7.4  ALBUMIN 4.3   Recent Labs  Lab 09/13/23 0849  LIPASE 36    CBC: Recent Labs  Lab 09/13/23 0849 09/14/23 0317  WBC 6.5 7.2  NEUTROABS 5.2  --   HGB 14.8 13.7  HCT 43.4 41.8  MCV 94.3 96.5  PLT 155 160     IMAGING STUDIES CT ABDOMEN PELVIS W CONTRAST  Result  Date: 09/13/2023 CLINICAL DATA:  Abdominal pain and diarrhea for approximately 2 weeks. EXAM: CT ABDOMEN AND PELVIS WITH CONTRAST TECHNIQUE: Multidetector CT imaging of the abdomen and pelvis was performed using the standard protocol following bolus administration of intravenous contrast. RADIATION DOSE REDUCTION: This exam was performed according to the departmental dose-optimization program which includes automated exposure control, adjustment of the mA and/or kV according to patient size and/or use of iterative reconstruction technique. CONTRAST:  OMNIPAQUE IOHEXOL 300 MG/ML  SOLN COMPARISON:  06/17/2023 FINDINGS: Lower Chest: No acute findings. Previously seen left pleural effusion has resolved. Hepatobiliary: No suspicious hepatic masses identified. Gallbladder is unremarkable. No evidence of biliary ductal dilatation. Pancreas:  No mass or inflammatory changes. Spleen: Within normal limits in size and appearance. Adrenals/Urinary Tract: No suspicious masses identified. No evidence  of ureteral calculi or hydronephrosis. Stomach/Bowel: Small to moderate hiatal hernia is again seen. No evidence of obstruction, bowel wall thickening or other inflammatory process, or abnormal fluid collections. Vascular/Lymphatic: No pathologically enlarged lymph nodes. No acute vascular findings. Reproductive: Several small calcified uterine fibroids are again noted, largest measuring 1.6 cm. A 2.4 cm benign-appearing cyst is again seen in the right adnexa, which remains stable. Other:  None. Musculoskeletal: No suspicious bone lesions identified. Mild wedge compression deformity of the T12 vertebral body is again noted. IMPRESSION: No acute findings. Small to moderate hiatal hernia. Small calcified uterine fibroids. Stable 2.4 cm benign-appearing right adnexal cyst. No follow-up imaging recommended. Reference: JACR 2020 Feb; 17(2):248-254 Electronically Signed   By: Danae Orleans M.D.   On: 09/13/2023 10:12    DISCHARGE  EXAMINATION: Vitals:   09/16/23 0556 09/16/23 1447 09/16/23 2246 09/17/23 0534  BP: (!) 139/91 (!) 146/87 (!) 142/82 139/84  Pulse: 67 78 62 69  Resp: 18 18 18 18   Temp: 98.7 F (37.1 C) (!) 97.5 F (36.4 C) 97.8 F (36.6 C) 97.9 F (36.6 C)  TempSrc: Oral  Oral   SpO2: 97% 100% 97% 100%  Weight:      Height:       General appearance: Awake alert.  In no distress.  Pleasantly confused Resp: Clear to auscultation bilaterally.  Normal effort Cardio: S1-S2 is normal regular.  No S3-S4.  No rubs murmurs or bruit GI: Abdomen is soft.  Nontender nondistended.  Bowel sounds are present normal.  No masses organomegaly    DISPOSITION: SNF  Discharge Instructions     Call MD for:  difficulty breathing, headache or visual disturbances   Complete by: As directed    Call MD for:  extreme fatigue   Complete by: As directed    Call MD for:  persistant dizziness or light-headedness   Complete by: As directed    Call MD for:  persistant nausea and vomiting   Complete by: As directed    Call MD for:  severe uncontrolled pain   Complete by: As directed    Call MD for:  temperature >100.4   Complete by: As directed    Diet general   Complete by: As directed    Discharge instructions   Complete by: As directed    Please be sure to follow-up with your primary care provider.  Take your medications as prescribed.  You were cared for by a hospitalist during your hospital stay. If you have any questions about your discharge medications or the care you received while you were in the hospital after you are discharged, you can call the unit and asked to speak with the hospitalist on call if the hospitalist that took care of you is not available. Once you are discharged, your primary care physician will handle any further medical issues. Please note that NO REFILLS for any discharge medications will be authorized once you are discharged, as it is imperative that you return to your primary care  physician (or establish a relationship with a primary care physician if you do not have one) for your aftercare needs so that they can reassess your need for medications and monitor your lab values. If you do not have a primary care physician, you can call [phone] for a physician referral.   Increase activity slowly   Complete by: As directed          Allergies as of 09/17/2023       Reactions  Yellow Jacket Venom Anaphylaxis, Swelling   Paxil [paroxetine] Other (See Comments)   "Anger issues"   Zoloft [sertraline] Other (See Comments)   "Anger issues"        Medication List     STOP taking these medications    cephALEXin 500 MG capsule Commonly known as: KEFLEX       TAKE these medications    acetaminophen 325 MG tablet Commonly known as: TYLENOL Take 2 tablets (650 mg total) by mouth every 6 (six) hours as needed for mild pain (pain score 1-3) (or Fever >/= 101).   brexpiprazole 0.25 MG Tabs tablet Commonly known as: REXULTI Take 1 tablet (0.25 mg total) by mouth daily.   FLUoxetine 10 MG capsule Commonly known as: PROZAC TAKE 1 CAPSULE (10 MG TOTAL) BY MOUTH DAILY.   levothyroxine 88 MCG tablet Commonly known as: SYNTHROID TAKE 1 TABLET (88 MCG TOTAL) BY MOUTH DAILY.   loperamide 2 MG capsule Commonly known as: IMODIUM Take 2 capsules (4 mg total) by mouth 3 (three) times daily as needed for diarrhea or loose stools.   melatonin 5 MG Tabs Take 1 tablet (5 mg total) by mouth at bedtime.   saccharomyces boulardii 250 MG capsule Commonly known as: FLORASTOR Take 1 capsule (250 mg total) by mouth 2 (two) times daily.           Follow-up Information     Tysinger, Kermit Balo, PA-C. Schedule an appointment as soon as possible for a visit in 1 week(s).   Specialty: Family Medicine Why: post hospitalization follow up Contact information: [address] [place] [address] [phone]                 TOTAL DISCHARGE TIME: 35  minutes  Tymere Depuy Rito Ehrlich  [facility] Pager on [email or web address]  09/17/2023, 10:30 AM

## 2023-09-17 NOTE — TOC Progression Note (Signed)
Transition of Care [facility]) - Progression Note    Patient Details  Name: Darlene Ballard MRN: [id number] Date of Birth: 1946/09/13  [facility] CM/SW Contact  Amada Jupiter, LCSW Phone Number: 09/17/2023, 4:28 PM  Clinical Narrative:     Have informed pt's daughter that insurance has denied SNF authorization.  She plans to appeal the denial.  Have updated MD and, as soon as paperwork received from [employer], will provide daughter with denial/ appeal instructions.     Expected Discharge Plan: Home w Home Health Services (vs. SNF) Barriers to Discharge: Continued Medical Work up, English as a second language teacher, SNF Pending bed offer  Expected Discharge Plan and Services In-house Referral: Clinical Social Work     Living arrangements for the past 2 months: Single Family Home                                       Social Determinants of Health (SDOH) Interventions SDOH Screenings   Food Insecurity: No Food Insecurity (09/13/2023)  Housing: Low Risk  (09/13/2023)  Transportation Needs: No Transportation Needs (09/13/2023)  Utilities: Not At Risk (09/13/2023)  Alcohol Screen: Low Risk  (05/19/2023)  Depression (PHQ2-9): Low Risk  (05/19/2023)  Financial Resource Strain: Low Risk  (05/19/2023)  Physical Activity: Sufficiently Active (05/19/2023)  Social Connections: Moderately Isolated (05/19/2023)  Stress: No Stress Concern Present (05/19/2023)  Tobacco Use: Low Risk  (09/13/2023)  Health Literacy: Adequate Health Literacy (05/19/2023)    Readmission Risk Interventions    09/15/2023    3:03 PM  Readmission Risk Prevention Plan  Transportation Screening Complete  PCP or Specialist Appt within 5-7 Days Complete  Home Care Screening Complete  Medication Review (RN CM) Complete

## 2023-09-17 NOTE — Progress Notes (Signed)
Physical Therapy Treatment Patient Details Name: Darlene Ballard MRN: [id number] DOB: [birth year] Today's Date: 09/17/2023   History of Present Illness 77 y.o. female who [facility] to [facility] due to diarrhea.  C difficile negative.  medical history significant of anxiety, dementia, hypothyroidism    PT Comments  Pt was seen in bed at beginning of treatment. Pt is a pleasantly confused lady, who has a stuffed dog that she likes to carry around. Pt performed supine to sit with supervision. Pt then performed sit to stand with CGA and HHA. Pt ambulated 60 ft throughout hallway. Pt was very unsteady, had a narrow BOS, and  was impulsive. Pt was repositioned in bed at end of session with supervision. Pt is a high fall risk due to impulsivity, unsteadiness, and weakness. Pt would benefit from continued PT to work on these deficits. LPT recommends SNF.    If plan is discharge home, recommend the following: Help with stairs or ramp for entrance;Assist for transportation;Assistance with cooking/housework;A little help with bathing/dressing/bathroom;A little help with walking and/or transfers;Direct supervision/assist for financial management;Supervision due to cognitive status;Direct supervision/assist for medications management   Can travel by private vehicle     Yes  Equipment Recommendations  None recommended by PT    Recommendations for Other Services       Precautions / Restrictions Precautions Precautions: Fall Restrictions Weight Bearing Restrictions: No     Mobility  Bed Mobility Overal bed mobility: Needs Assistance Bed Mobility: Supine to Sit, Sit to Supine     Supine to sit: Supervision Sit to supine: Supervision   General bed mobility comments: Pt did not require assist from therapist to perform bed mobility, pt performed with increased time and cues    Transfers Overall transfer level: Needs assistance Equipment used: 1 person hand held assist Transfers: Sit to/from Stand, Bed  to chair/wheelchair/BSC Sit to Stand: Contact guard assist           General transfer comment: Bed slightly elevated, cues to stand up tall, narrow BOS, given stuffed dog to prevent pt reaching with R UE.    Ambulation/Gait Ambulation/Gait assistance: Min assist, Contact guard assist Gait Distance (Feet): 60 Feet Assistive device: 1 person hand held assist Gait Pattern/deviations: Step-through pattern, Decreased stride length, Trunk flexed, Narrow base of support, Decreased step length - right, Decreased step length - left Gait velocity: decreased     General Gait Details: Lateral sway bilaterally, stuffed animal in R UE to prevent reaching when ambulating, narrow BOS, cues to stand tall, tolereated distance well.   Stairs             Wheelchair Mobility     Tilt Bed    Modified Rankin (Stroke Patients Only)       Balance                                            Cognition Arousal: Alert Behavior During Therapy: WFL for tasks assessed/performed Overall Cognitive Status: History of cognitive impairments - at baseline                                 General Comments: Pleasant lady, impulsive, willing to work with PT.        Exercises      General Comments        Pertinent  Vitals/Pain Pain Assessment Pain Assessment: No/denies pain    Home Living                          Prior Function            PT Goals (current goals can now be found in the care plan section) Acute Rehab PT Goals Patient Stated Goal: rehab with eventual memory care PT Goal Formulation: With patient/family Time For Goal Achievement: 09/28/23 Progress towards PT goals: Progressing toward goals    Frequency    Min 1X/week      PT Plan      Co-evaluation              AM-PAC PT "6 Clicks" Mobility   Outcome Measure  Help needed turning from your back to your side while in a flat bed without using bedrails?: A  Little Help needed moving from lying on your back to sitting on the side of a flat bed without using bedrails?: A Little Help needed moving to and from a bed to a chair (including a wheelchair)?: A Little Help needed standing up from a chair using your arms (e.g., wheelchair or bedside chair)?: A Little Help needed to walk in hospital room?: A Little Help needed climbing 3-5 steps with a railing? : A Lot 6 Click Score: 17    End of Session Equipment Utilized During Treatment: Gait belt Activity Tolerance: Patient tolerated treatment well Patient left: with call bell/phone within reach;in bed;with bed alarm set Nurse Communication: Mobility status PT Visit Diagnosis: Other abnormalities of gait and mobility (R26.89);Difficulty in walking, not elsewhere classified (R26.2)     Time: 1525-1540 PT Time Calculation (min) (ACUTE ONLY): 15 min  Charges:    $Gait Training: 8-22 mins PT General Charges $$ ACUTE PT VISIT: 1 Visit                    Lazaro Arms, SPTA 09/17/2023, 3:52 PM

## 2023-09-17 NOTE — Plan of Care (Signed)

## 2023-09-18 ENCOUNTER — Ambulatory Visit (INDEPENDENT_AMBULATORY_CARE_PROVIDER_SITE_OTHER): Payer: PPO | Admitting: Medical

## 2023-09-18 ENCOUNTER — Encounter: Payer: Self-pay | Admitting: Medical

## 2023-09-18 DIAGNOSIS — R159 Full incontinence of feces: Secondary | ICD-10-CM | POA: Diagnosis not present

## 2023-09-18 DIAGNOSIS — R197 Diarrhea, unspecified: Secondary | ICD-10-CM

## 2023-09-18 DIAGNOSIS — F919 Conduct disorder, unspecified: Secondary | ICD-10-CM | POA: Diagnosis not present

## 2023-09-18 DIAGNOSIS — F03C18 Unspecified dementia, severe, with other behavioral disturbance: Secondary | ICD-10-CM | POA: Diagnosis not present

## 2023-09-18 DIAGNOSIS — E039 Hypothyroidism, unspecified: Secondary | ICD-10-CM | POA: Diagnosis not present

## 2023-09-18 DIAGNOSIS — Z741 Need for assistance with personal care: Secondary | ICD-10-CM

## 2023-09-18 DIAGNOSIS — R8290 Unspecified abnormal findings in urine: Secondary | ICD-10-CM | POA: Diagnosis not present

## 2023-09-18 MED ORDER — LOPERAMIDE HCL 2 MG PO CAPS
4.0000 mg | ORAL_CAPSULE | Freq: Three times a day (TID) | ORAL | Status: AC
  Administered 2023-09-18 (×3): 4 mg via ORAL
  Filled 2023-09-18 (×3): qty 2

## 2023-09-18 NOTE — Progress Notes (Signed)
Occupational Therapy Treatment Patient Details Name: Darlene Ballard MRN: [id number] DOB: June 16, 1946 Today's Date: 09/18/2023   History of present illness 77 yr old female who presents to [facility] ED due to diarrhea.  C difficile negative.  medical history significant of anxiety, dementia, hypothyroidism   OT comments  Pt was seen for ADL instruction and training, as pertains to lower body dressing and performing a toilet transfer at bathroom level. Pt required mod assist overall for lower body dressing and CGA to min assist for toilet transfer. She was largely limited by impaired cognition, specifically disorientation with confusion, decreased attention, and impaired problem solving; she required increased cues in this regard, as well as cues for short term recall and sequencing. She will continue to benefit from OT services to maximize her ADL performance. Patient will benefit from continued inpatient follow up therapy, <3 hours/day. If unable to go to SNF at discharge, she will needed max supervision due to her cognitive deficits.        If plan is discharge home, recommend the following:  A little help with walking and/or transfers;A little help with bathing/dressing/bathroom;Assistance with cooking/housework;Direct supervision/assist for medications management;Direct supervision/assist for financial management;Assist for transportation;Help with stairs or ramp for entrance;Supervision due to cognitive status   Equipment Recommendations  None recommended by OT    Recommendations for Other Services      Precautions / Restrictions Precautions Precautions: Fall Restrictions Weight Bearing Restrictions: No       Mobility                Transfers Overall transfer level: Needs assistance Equipment used: Rolling walker (2 wheels) Transfers: Sit to/from Stand Sit to Stand: Contact guard assist                     ADL either performed or assessed with clinical judgement   ADL    Eating/Feeding: Set up;Sitting     Grooming Details (indicate cue type and reason): Pt was found seated in bedside chair self-feeding herself a cookie             Lower Body Dressing: Cueing for sequencing;Sit to/from stand;Moderate assistance Lower Body Dressing Details (indicate cue type and reason): Pt was noted to be with a slightly soiled brief. As such, OT instructed her on doffing the brief then donning another clean one. She required mod assist overall, with assist needed to don clean brief over ankles and up to knees in sitting. Pt then stood and pulled brief up over her hips in standing, needing steadying assist. She required max cues for sustained attention to task, sequencing, short term recall, and problem solving. Toilet Transfer: Contact guard assist;Rolling walker (2 wheels);Ambulation Toilet Transfer Details (indicate cue type and reason): The pt ambulated to and from the bathroom in her room using a RW. She required steadying assist for transfer, as well as cues for walker placement, short term recall and attention to task.                           Cognition Arousal: Alert   Overall Cognitive Status: History of cognitive impairments - at baseline Area of Impairment: Memory, Attention, Problem solving        General Comments: disorientation, confusion, required redirection to tasks, required cues for memory, required intermittent repetition of prompts to follow simple commands, friendly  Pertinent Vitals/ Pain       Pain Assessment Pain Assessment: No/denies pain         Frequency  Min 1X/week        Progress Toward Goals  OT Goals(current goals can now be found in the care plan section)     Acute Rehab OT Goals Patient Stated Goal: unable to state OT Goal Formulation: Patient unable to participate in goal setting Potential to Achieve Goals: Fair  Plan         AM-PAC OT "6 Clicks" Daily Activity     Outcome  Measure   Help from another person eating meals?: A Little Help from another person taking care of personal grooming?: A Little Help from another person toileting, which includes using toliet, bedpan, or urinal?: A Little Help from another person bathing (including washing, rinsing, drying)?: A Little Help from another person to put on and taking off regular upper body clothing?: A Little Help from another person to put on and taking off regular lower body clothing?: A Lot 6 Click Score: 17    End of Session Equipment Utilized During Treatment: Rolling walker (2 wheels)  OT Visit Diagnosis: Muscle weakness (generalized) (M62.81);Unsteadiness on feet (R26.81);Other symptoms and signs involving cognitive function   Activity Tolerance Patient tolerated treatment well   Patient Left in chair;with call bell/phone within reach;with chair alarm set   Nurse Communication Mobility status        Time: 4403-4742 OT Time Calculation (min): 13 min  Charges: OT General Charges $OT Visit: 1 Visit OT Treatments $Self Care/Home Management : 8-22 mins     Reuben Likes, OTR/L 09/18/2023, 5:35 PM

## 2023-09-18 NOTE — Progress Notes (Signed)
Physical Therapy Treatment Patient Details Name: Darlene Ballard MRN: [id number] DOB: 17-Jun-1946 Today's Date: 09/18/2023   History of Present Illness 77 y.o. female who presents to [facility] due to diarrhea.  C difficile negative.  medical history significant of anxiety, dementia, hypothyroidism    PT Comments  Pt was seen in room with daughter present. Pt was agreeable to work with PT. Pt was a nice lady but was very confused. Pt performed supine to sit with elevated HOB and supervision. Pt was able to maintain balance while seated on EOB. Pt performed sit to stand from slightly elevated bed with L HHA and CGA. Pt did not use R UE to push from surface. Pt ambulated 700 ft in hallway with L HHA, CGA, and daughter following with recliner. At end of session, pt was positioned in recliner to eat lunch. Pt denied any pain. Pt will benefit from continued therapy to increase strength and endurance. LPT recommends SNF.   If plan is discharge home, recommend the following: Help with stairs or ramp for entrance;Assist for transportation;Assistance with cooking/housework;A little help with bathing/dressing/bathroom;A little help with walking and/or transfers;Direct supervision/assist for financial management;Supervision due to cognitive status;Direct supervision/assist for medications management   Can travel by private vehicle     Yes  Equipment Recommendations  None recommended by PT    Recommendations for Other Services       Precautions / Restrictions Precautions Precautions: Fall Restrictions Weight Bearing Restrictions: No     Mobility  Bed Mobility Overal bed mobility: Needs Assistance Bed Mobility: Supine to Sit     Supine to sit: Supervision     General bed mobility comments: Pt performed supine to sit with increased time and redirection    Transfers Overall transfer level: Needs assistance Equipment used: 1 person hand held assist   Sit to Stand: Contact guard assist, From  elevated surface           General transfer comment: Pt performed sit to stand from slightly elevated surface, narrow BOS, did not use UEs to assist.    Ambulation/Gait Ambulation/Gait assistance: Min assist, Contact guard assist Gait Distance (Feet): 700 Feet Assistive device: 1 person hand held assist Gait Pattern/deviations: Step-through pattern, Decreased stride length, Trunk flexed, Narrow base of support, Decreased step length - right, Decreased step length - left Gait velocity: decreased     General Gait Details: Lateral sway bilaterally, unsteady expecially when pt speeds up, cues to slow down at times. Pt gets distracted very easily, which causes even more unsteadiness.   Stairs             Wheelchair Mobility     Tilt Bed    Modified Rankin (Stroke Patients Only)       Balance                                            Cognition Arousal: Alert Behavior During Therapy: WFL for tasks assessed/performed, Impulsive Overall Cognitive Status: History of cognitive impairments - at baseline                                 General Comments: Confused, agreeable to work with PT, impulsive        Exercises      General Comments        Pertinent Vitals/Pain  Pain Assessment Pain Assessment: No/denies pain    Home Living                          Prior Function            PT Goals (current goals can now be found in the care plan section) Acute Rehab PT Goals Patient Stated Goal: rehab with eventual memory care PT Goal Formulation: With patient/family Time For Goal Achievement: 09/28/23 Progress towards PT goals: Progressing toward goals    Frequency    Min 1X/week      PT Plan      Co-evaluation              AM-PAC PT "6 Clicks" Mobility   Outcome Measure  Help needed turning from your back to your side while in a flat bed without using bedrails?: None Help needed moving from lying  on your back to sitting on the side of a flat bed without using bedrails?: None Help needed moving to and from a bed to a chair (including a wheelchair)?: A Little Help needed standing up from a chair using your arms (e.g., wheelchair or bedside chair)?: A Little Help needed to walk in hospital room?: A Little Help needed climbing 3-5 steps with a railing? : A Lot 6 Click Score: 19    End of Session Equipment Utilized During Treatment: Gait belt Activity Tolerance: Patient tolerated treatment well Patient left: with call bell/phone within reach;in bed;with bed alarm set Nurse Communication: Mobility status PT Visit Diagnosis: Other abnormalities of gait and mobility (R26.89);Difficulty in walking, not elsewhere classified (R26.2)     Time: 1914-7829 PT Time Calculation (min) (ACUTE ONLY): 16 min  Charges:    $Gait Training: 8-22 mins PT General Charges $$ ACUTE PT VISIT: 1 Visit                    Lazaro Arms, SPTA 09/18/2023, 12:27 PM

## 2023-09-18 NOTE — Plan of Care (Signed)
  Problem: Coping: Goal: Level of anxiety will decrease Outcome: Progressing   Problem: Pain Management: Goal: General experience of comfort will improve Outcome: Progressing   Problem: Safety: Goal: Ability to remain free from injury will improve Outcome: Progressing

## 2023-09-18 NOTE — Progress Notes (Addendum)
Subjective: Chief Complaint  Patient presents with   Consult    Discuss getting an appeal done to get her to SNF   Consult today with her daughter and caregiver Darlene Ballard.  Patient is in the hospital and not present during this consult.  History provided by daughter Darlene Ballard and hospitalization reports and notes.  Darlene Ballard is currently in the hospital as an inpatient at [facility].  Daughter is frustrated and needing advice on what to do.  The hospitalist is going to discharge her but she is not ready to come home or in physical shape to come home.  Daughter feels like she should be in skilled nursing facility/memory care unit but her insurance is denying the request saying that she is at her baseline when in fact her daughter says she is nowhere near her baseline  She started having diarrhea 08/31/2023.  It was so bad that she would be leaking diarrhea nonstop, she was messing up the bed multiple times a day.  She ended up taking her to the hospital 09/13/2023 for evaluation.  She was admitted to the hospital.  Prior to the hospital she was being cared for in the evening at home by [facility] and during the day she would go to a adult daycare facility and participate in activities.  During the immediate weeks prior to the hospitalization she could walk and ambulate just fine.  She can transfer in and out of bed just fine.  She certainly has memory issues and would say things out of character at times that she was still be more talkative and conversational and more independent with walking and toileting  Currently in the hospital Darlene Ballard notes that she requires assistance walking full-time right now including taking really short steps anytime she is out of bed.  She is requiring assistance to get to the bathroom.  The diarrhea continues despite initial stool test but does not show a specific pathogen.  Darlene Ballard notes that the diarrhea is so bad that it just seeps out nonstop.  For example the nurses tried  to walk to the bathroom today and she leaked out on the floor from the bed to the toilet.  She was having similar symptoms at home before the hospitalization leaking all over the place with stool.  She had some initial test in the hospital that did not show C. difficile or other pathogen but she still has significant frequent diarrhea.  She did have a urine culture showing Pseudomonas bacteria  Given her changes in ambulation, requiring assistance, continual incontinence and loose stools/diarrhea, Darlene Ballard notes that she is not in physical shape to come home  She requested her to be transferred to a skilled nursing facility/memory care unit but notes that the insurance denied the request saying that she was back to her baseline.  However Darlene Ballard notes that she is definitely not back to her baseline given that she did not have ambulation issues or the diarrhea issues prior to 08/31/2023 when the symptoms started  She was on antibiotics several weeks ago and it was stopped the diarrhea initially was related to the antibiotic use.  However she has been off antibiotics for 3 weeks at least or more.  So far she is not responding to Imodium but she has only been on the Imodium 1 day.  Darlene Ballard reports that she notes that the initial conversation she had with the hospitalist was that they were going to use Imodium and some IV fluids but she notes it took 2  days before these were implemented  Darlene Ballard has requested a urologist be consulted or GI to be consulted at the hospital as her symptoms are not improving and she has abnormal urine culture and ongoing diarrhea symptoms.  She feels like she is in a hard predicament because the hospitalist notes that she needs to be discharged but Darlene Ballard does not feel she is anywhere near ready to come home.  Darlene Ballard really feels like she should be discharged to a memory care's, skilled nursing facility unit given her change in status and ongoing symptoms and care needs  She has  known dementia but she is having more hallucinations and problems in the hospital.  Rexulti was added to help with those symptoms, so this was just initiated by the hospitalist   She has established with urology prior to this hospitalization with Dr. Concepcion Living, with [facility]   No other aggravating or relieving factors. No other complaint.  Past Medical History:  Diagnosis Date   Anxiety    Chronic knee pain    Dementia (HCC)    Hypothyroidism    Onychodystrophy    Sleep disturbance    Current Facility-Administered Medications on File Prior to Visit  Medication Dose Route Frequency Provider Last Rate Last Admin   acetaminophen (TYLENOL) tablet 650 mg  650 mg Oral Q6H PRN Alan Mulder, MD       Or   acetaminophen (TYLENOL) suppository 650 mg  650 mg Rectal Q6H PRN Alan Mulder, MD       brexpiprazole (REXULTI) tablet 0.25 mg  0.25 mg Oral Daily Lynden Oxford M, MD   0.25 mg at 09/18/23 1022   enoxaparin (LOVENOX) injection 40 mg  40 mg Subcutaneous Q24H Alan Mulder, MD   40 mg at 09/17/23 2109   feeding supplement (BOOST / RESOURCE BREEZE) liquid 1 Container  1 Container Oral TID BM Rolly Salter, MD   1 Container at 09/18/23 1026   FLUoxetine (PROZAC) capsule 10 mg  10 mg Oral Daily Alan Mulder, MD   10 mg at 09/18/23 1022   levothyroxine (SYNTHROID) tablet 88 mcg  88 mcg Oral QAC breakfast Alan Mulder, MD   88 mcg at 09/18/23 0523   loperamide (IMODIUM) capsule 4 mg  4 mg Oral TID PRN Osvaldo Shipper, MD       loperamide (IMODIUM) capsule 4 mg  4 mg Oral TID Osvaldo Shipper, MD   4 mg at 09/18/23 1022   ondansetron (ZOFRAN) tablet 4 mg  4 mg Oral Q6H PRN Alan Mulder, MD       Or   ondansetron (ZOFRAN) injection 4 mg  4 mg Intravenous Q6H PRN Alan Mulder, MD       oxyCODONE (Oxy IR/ROXICODONE) immediate release tablet 5 mg  5 mg Oral Q4H PRN Dorrell, Molly Maduro, MD       saccharomyces boulardii (FLORASTOR) capsule 250 mg  250 mg Oral BID Osvaldo Shipper, MD   250 mg at 09/18/23 1022   Current Outpatient Medications on File Prior to Visit  Medication Sig Dispense Refill   acetaminophen (TYLENOL) 325 MG tablet Take 2 tablets (650 mg total) by mouth every 6 (six) hours as needed for mild pain (pain score 1-3) (or Fever >/= 101).     brexpiprazole (REXULTI) 0.25 MG TABS tablet Take 1 tablet (0.25 mg total) by mouth daily.     cephALEXin (KEFLEX) 500 MG capsule Take 1 capsule (500 mg total) by mouth 4 (four) times daily. (Patient not taking: Reported  on 09/13/2023) 20 capsule 0   FLUoxetine (PROZAC) 10 MG capsule TAKE 1 CAPSULE (10 MG TOTAL) BY MOUTH DAILY. 30 capsule 1   levothyroxine (SYNTHROID) 88 MCG tablet TAKE 1 TABLET (88 MCG TOTAL) BY MOUTH DAILY. 30 tablet 2   loperamide (IMODIUM) 2 MG capsule Take 2 capsules (4 mg total) by mouth 3 (three) times daily as needed for diarrhea or loose stools.     melatonin 5 MG TABS Take 1 tablet (5 mg total) by mouth at bedtime.     saccharomyces boulardii (FLORASTOR) 250 MG capsule Take 1 capsule (250 mg total) by mouth 2 (two) times daily.      ROS as in subjective    Objective: There were no vitals taken for this visit.  Patient not in person, not examined    Assessment: Encounter Diagnoses  Name Primary?   Diarrhea, unspecified type Yes   Severe dementia with other behavioral disturbance, unspecified dementia type (HCC)    Incontinence of feces, unspecified fecal incontinence type    Abnormal urine findings    Behavior disturbance    Self-care deficit for bathing and hygiene    Hypothyroidism, unspecified type     Plan: I reviewed her hospitalization notes.  Annual her daughter is in a tough spot dealing with both the hospital and the insurance.  Sharni's workup is showing no specific pathogen in the GI profile and negative on C. difficile however she continues to have loose stools, diarrhea, incontinence of stool, ambulation difficulties, trouble transferring out of the bed  without assistance and ambulation requiring assistance.  She is having hallucinations and has underlying severe dementia  She has a urine culture on file with this hospitalization showing Pseudomonas  Darlene Ballard called the urology office and they feel she should be on treatment for the Pseudomonas  [name]'s conversations with the hospital team is that Darlene Ballard feels she needs to be inpatient a little longer until symptoms have gotten under better control or either be discharged to skilled nursing facility/memory care as she is not at her baseline and not ready to come home  There is not staff and support at home to be able to take care of her with these current needs  Her insurance denied the request to be transferred to memory care/skilled nursing facility today based on the comment that she was at her baseline when she is actually not at her baseline.  I wrote an appeal letter regarding this issue to request that insurance approve her admittance to skilled nursing facility/memory care, that she is not at her baseline, and still having a lot of symptoms despite some initial negative test for a specific pathogen regarding the stools and diarrhea.  She also has a change in ambulation status and worse hallucinations currently which are also not her baseline  Encouraged Darlene Ballard to continue to work with the hospital team to request they keep her inpatient little bit longer to get a better handle on her symptoms.  Perhaps GI and urology could consult with her while she is still at the hospital and inpatient  Taraneh was seen today for consult.  Diagnoses and all orders for this visit:  Diarrhea, unspecified type  Severe dementia with other behavioral disturbance, unspecified dementia type (HCC)  Incontinence of feces, unspecified fecal incontinence type  Abnormal urine findings  Behavior disturbance  Self-care deficit for bathing and hygiene  Hypothyroidism, unspecified type    F/u pending  appeal to insurance

## 2023-09-18 NOTE — Discharge Summary (Signed)
[facility]  Physician Discharge Summary   Patient ID: Darlene Ballard MRN: [id number] DOB/AGE: 1946/04/28 77 y.o.  Admit date: 09/13/2023 Discharge date:   09/18/2023   PCP: Jac Canavan, PA-C  DISCHARGE DIAGNOSES:  Acute diarrhea, resolved Dementia with behavioral issues Hypokalemia Hypothyroidism   RECOMMENDATIONS FOR OUTPATIENT FOLLOW UP: Check CBC and basic metabolic panel and magnesium in 3 to 4 days    Home Health: SNF Equipment/Devices: None  CODE STATUS: Full code  DISCHARGE CONDITION: fair  Diet recommendation: Regular as tolerated  INITIAL HISTORY: 77 year old female with past medical history of dementia, hypothyroidism, anxiety presented with diarrhea.  Concern for UTI was also present on admission.  She was given IV fluids.  Given antimotility agents after infection was ruled out.     HOSPITAL COURSE:   Diarrhea. C. difficile testing was negative.  GI pathogen panel was negative.  CT scan of the abdomen did not show any acute findings.  She was given Imodium with improvement in diarrhea.  Has had some recurrence in the last 24 hours.  Will give her scheduled Imodium for 3 doses.  Continue probiotics.  Abdomen remains benign on examination.  May need benefit from GI consultation if diarrhea does not get better in 2 to 3 weeks.   Hypokalemia. Likely from GI loss as well as poor oral intake.  Supplemented.  Magnesium 2.0.  Recheck labs tomorrow if she is still here.   Hypothyroidism. Continue Synthroid. TSH is suppressed.  Free T4 is normal.   Dementia with behavioral issues. Appears to be close to baseline for her memory issues. Has seen neurology in September. Currently on Prozac 10 mg. Unable to tolerate olanzapine. Rexulti was recommended but not prescribed.  This has been initiated.   Behavior has been well-controlled here in the hospital.   Concern for UTI. Patient was started on IV antibiotic thinking that she actually has some  UTI.  Subsequent rounding physicians did not think that patient had active UTI. Antibiotics were thought to be responsible for her diarrhea so they were discontinued.   Urine culture is growing 60,000 colonies of Pseudomonas.  WBC is normal.  She is afebrile and asymptomatic.  No clear indication to continue with antibiotics at this time.  Daughter wondering if she needs urology consult.  Long discussion with daughter regarding the results of urine culture.  She was told about possibility of colonization in the absence of symptoms.  WBC has been normal.  She has been afebrile.  She was told that there was no clear indication for involving urology at this time.  Patient is stable.  Okay for discharge to SNF when bed is available.  PERTINENT LABS:  The results of significant diagnostics from this hospitalization (including imaging, microbiology, ancillary and laboratory) are listed below for reference.    Microbiology: Recent Results (from the past 240 hour(s))  C Difficile Quick Screen w PCR reflex     Status: None   Collection Time: 09/13/23  9:39 AM   Specimen: STOOL  Result Value Ref Range Status   C Diff antigen NEGATIVE NEGATIVE Final   C Diff toxin NEGATIVE NEGATIVE Final   C Diff interpretation No C. difficile detected.  Final    Comment: Performed at [facility], [address], [place], [place] [address]  Gastrointestinal Panel by PCR , Stool     Status: None   Collection Time: 09/13/23  9:39 AM   Specimen: Stool  Result Value Ref Range Status   Campylobacter  species NOT DETECTED NOT DETECTED Final   Plesimonas shigelloides NOT DETECTED NOT DETECTED Final   Salmonella species NOT DETECTED NOT DETECTED Final   Yersinia enterocolitica NOT DETECTED NOT DETECTED Final   Vibrio species NOT DETECTED NOT DETECTED Final   Vibrio cholerae NOT DETECTED NOT DETECTED Final   Enteroaggregative E coli (EAEC) NOT DETECTED NOT DETECTED Final   Enteropathogenic E coli (EPEC)  NOT DETECTED NOT DETECTED Final   Enterotoxigenic E coli (ETEC) NOT DETECTED NOT DETECTED Final   Shiga like toxin producing E coli (STEC) NOT DETECTED NOT DETECTED Final   Shigella/Enteroinvasive E coli (EIEC) NOT DETECTED NOT DETECTED Final   Cryptosporidium NOT DETECTED NOT DETECTED Final   Cyclospora cayetanensis NOT DETECTED NOT DETECTED Final   Entamoeba histolytica NOT DETECTED NOT DETECTED Final   Giardia lamblia NOT DETECTED NOT DETECTED Final   Adenovirus F40/41 NOT DETECTED NOT DETECTED Final   Astrovirus NOT DETECTED NOT DETECTED Final   Norovirus GI/GII NOT DETECTED NOT DETECTED Final   Rotavirus A NOT DETECTED NOT DETECTED Final   Sapovirus (I, II, IV, and V) NOT DETECTED NOT DETECTED Final    Comment: Performed at [facility], [address] [place], [place] [address]  Urine Culture     Status: Abnormal   Collection Time: 09/13/23  8:08 PM   Specimen: Urine, Clean Catch  Result Value Ref Range Status   Specimen Description   Final    URINE, CLEAN CATCH Performed at [facility], [address], [place], [place] [address]    Special Requests   Final    NONE Performed at [facility], [address], [place], [place] [address]    Culture 60,000 COLONIES/mL PSEUDOMONAS AERUGINOSA (A)  Final   Report Status 09/16/2023 FINAL  Final   Organism ID, Bacteria PSEUDOMONAS AERUGINOSA (A)  Final      Susceptibility   Pseudomonas aeruginosa - MIC*    CEFTAZIDIME 2 SENSITIVE Sensitive     CIPROFLOXACIN <=0.25 SENSITIVE Sensitive     GENTAMICIN <=1 SENSITIVE Sensitive     IMIPENEM 2 SENSITIVE Sensitive     PIP/TAZO <=4 SENSITIVE Sensitive ug/mL    CEFEPIME 2 SENSITIVE Sensitive     * 60,000 COLONIES/mL PSEUDOMONAS AERUGINOSA     Labs:   Basic Metabolic Panel: Recent Labs  Lab 09/13/23 0849 09/14/23 0317 09/15/23 0935 09/17/23 0355  NA 137 137 138 140  K 3.3* 3.1* 3.1* 3.5  CL 103 103 103 105  CO2 27 25 28 27    GLUCOSE 82 116* 82 89  BUN 14 9 6* 18  CREATININE 0.53 0.56 0.48 0.57  CALCIUM 8.6* 8.3* 8.8* 8.9  MG  --   --  1.9 2.0   Liver Function Tests: Recent Labs  Lab 09/13/23 0849  AST 22  ALT 12  ALKPHOS 78  BILITOT 0.8  PROT 7.4  ALBUMIN 4.3   Recent Labs  Lab 09/13/23 0849  LIPASE 36    CBC: Recent Labs  Lab 09/13/23 0849 09/14/23 0317  WBC 6.5 7.2  NEUTROABS 5.2  --   HGB 14.8 13.7  HCT 43.4 41.8  MCV 94.3 96.5  PLT 155 160     IMAGING STUDIES CT ABDOMEN PELVIS W CONTRAST  Result Date: 09/13/2023 CLINICAL DATA:  Abdominal pain and diarrhea for approximately 2 weeks. EXAM: CT ABDOMEN AND PELVIS WITH CONTRAST TECHNIQUE: Multidetector CT imaging of the abdomen and pelvis was performed using the standard protocol following bolus administration of intravenous contrast. RADIATION  DOSE REDUCTION: This exam was performed according to the departmental dose-optimization program which includes automated exposure control, adjustment of the mA and/or kV according to patient size and/or use of iterative reconstruction technique. CONTRAST:  OMNIPAQUE IOHEXOL 300 MG/ML  SOLN COMPARISON:  06/17/2023 FINDINGS: Lower Chest: No acute findings. Previously seen left pleural effusion has resolved. Hepatobiliary: No suspicious hepatic masses identified. Gallbladder is unremarkable. No evidence of biliary ductal dilatation. Pancreas:  No mass or inflammatory changes. Spleen: Within normal limits in size and appearance. Adrenals/Urinary Tract: No suspicious masses identified. No evidence of ureteral calculi or hydronephrosis. Stomach/Bowel: Small to moderate hiatal hernia is again seen. No evidence of obstruction, bowel wall thickening or other inflammatory process, or abnormal fluid collections. Vascular/Lymphatic: No pathologically enlarged lymph nodes. No acute vascular findings. Reproductive: Several small calcified uterine fibroids are again noted, largest measuring 1.6 cm. A 2.4 cm  benign-appearing cyst is again seen in the right adnexa, which remains stable. Other:  None. Musculoskeletal: No suspicious bone lesions identified. Mild wedge compression deformity of the T12 vertebral body is again noted. IMPRESSION: No acute findings. Small to moderate hiatal hernia. Small calcified uterine fibroids. Stable 2.4 cm benign-appearing right adnexal cyst. No follow-up imaging recommended. Reference: JACR 2020 Feb; 17(2):248-254 Electronically Signed   By: Danae Orleans M.D.   On: 09/13/2023 10:12    DISCHARGE EXAMINATION: Vitals:   09/17/23 2132 09/18/23 0507 09/18/23 0938 09/18/23 1038  BP: 128/89 (!) 113/92 134/77 (!) 147/76  Pulse: 77 81 65 65  Resp: 17 18 16 18   Temp: (!) 97.4 F (36.3 C) 97.8 F (36.6 C) 97.7 F (36.5 C) 97.9 F (36.6 C)  TempSrc:      SpO2: 96% 98% 100% 100%  Weight:      Height:       General appearance: Awake alert.  In no distress Resp: Clear to auscultation bilaterally.  Normal effort Cardio: S1-S2 is normal regular.  No S3-S4.  No rubs murmurs or bruit GI: Abdomen is soft.  Nontender nondistended.  Bowel sounds are present normal.  No masses organomegaly    DISPOSITION: SNF  Discharge Instructions     Call MD for:  difficulty breathing, headache or visual disturbances   Complete by: As directed    Call MD for:  extreme fatigue   Complete by: As directed    Call MD for:  persistant dizziness or light-headedness   Complete by: As directed    Call MD for:  persistant nausea and vomiting   Complete by: As directed    Call MD for:  severe uncontrolled pain   Complete by: As directed    Call MD for:  temperature >100.4   Complete by: As directed    Diet general   Complete by: As directed    Discharge instructions   Complete by: As directed    Please be sure to follow-up with your primary care provider.  Take your medications as prescribed.  You were cared for by a hospitalist during your hospital stay. If you have any questions about  your discharge medications or the care you received while you were in the hospital after you are discharged, you can call the unit and asked to speak with the hospitalist on call if the hospitalist that took care of you is not available. Once you are discharged, your primary care physician will handle any further medical issues. Please note that NO REFILLS for any discharge medications will be authorized once you are discharged, as it is  imperative that you return to your primary care physician (or establish a relationship with a primary care physician if you do not have one) for your aftercare needs so that they can reassess your need for medications and monitor your lab values. If you do not have a primary care physician, you can call [phone] for a physician referral.   Increase activity slowly   Complete by: As directed          Allergies as of 09/18/2023       Reactions   Yellow Jacket Venom Anaphylaxis, Swelling   Paxil [paroxetine] Other (See Comments)   "Anger issues"   Zoloft [sertraline] Other (See Comments)   "Anger issues"        Medication List     STOP taking these medications    cephALEXin 500 MG capsule Commonly known as: KEFLEX       TAKE these medications    acetaminophen 325 MG tablet Commonly known as: TYLENOL Take 2 tablets (650 mg total) by mouth every 6 (six) hours as needed for mild pain (pain score 1-3) (or Fever >/= 101).   brexpiprazole 0.25 MG Tabs tablet Commonly known as: REXULTI Take 1 tablet (0.25 mg total) by mouth daily.   FLUoxetine 10 MG capsule Commonly known as: PROZAC TAKE 1 CAPSULE (10 MG TOTAL) BY MOUTH DAILY.   levothyroxine 88 MCG tablet Commonly known as: SYNTHROID TAKE 1 TABLET (88 MCG TOTAL) BY MOUTH DAILY.   loperamide 2 MG capsule Commonly known as: IMODIUM Take 2 capsules (4 mg total) by mouth 3 (three) times daily as needed for diarrhea or loose stools.   melatonin 5 MG Tabs Take 1 tablet (5 mg total) by mouth at  bedtime.   saccharomyces boulardii 250 MG capsule Commonly known as: FLORASTOR Take 1 capsule (250 mg total) by mouth 2 (two) times daily.            Follow-up Information     Tysinger, Kermit Balo, PA-C. Schedule an appointment as soon as possible for a visit in 1 week(s).   Specialty: Family Medicine Why: post hospitalization follow up Contact information: [address] [place] [address] [phone]                 TOTAL DISCHARGE TIME: 35 minutes  Alphus Zeck Rito Ehrlich  [facility] Pager on [email or web address]  09/18/2023, 11:00 AM

## 2023-09-18 NOTE — Telephone Encounter (Signed)
Spoke with patient's daughter and she is trying to plead and fight for SNF she can go to. She is going to watch her for PT and see how she is. Still having a lot of diarrhea and not doing well.   Advised her that we have no control of what hospital is doing and just recommend her to continue to fight her her rights for her mom

## 2023-09-19 DIAGNOSIS — G9349 Other encephalopathy: Secondary | ICD-10-CM | POA: Diagnosis not present

## 2023-09-19 DIAGNOSIS — B999 Unspecified infectious disease: Secondary | ICD-10-CM | POA: Diagnosis not present

## 2023-09-19 LAB — CBC
HCT: 40.5 % (ref 36.0–46.0)
Hemoglobin: 13.2 g/dL (ref 12.0–15.0)
MCH: 31.7 pg (ref 26.0–34.0)
MCHC: 32.6 g/dL (ref 30.0–36.0)
MCV: 97.1 fL (ref 80.0–100.0)
Platelets: 169 10*3/uL (ref 150–400)
RBC: 4.17 MIL/uL (ref 3.87–5.11)
RDW: 13.6 % (ref 11.5–15.5)
WBC: 5.6 10*3/uL (ref 4.0–10.5)
nRBC: 0 % (ref 0.0–0.2)

## 2023-09-19 LAB — BASIC METABOLIC PANEL
Anion gap: 5 (ref 5–15)
BUN: 20 mg/dL (ref 8–23)
CO2: 26 mmol/L (ref 22–32)
Calcium: 8.3 mg/dL — ABNORMAL LOW (ref 8.9–10.3)
Chloride: 103 mmol/L (ref 98–111)
Creatinine, Ser: 0.58 mg/dL (ref 0.44–1.00)
GFR, Estimated: >60 mL/min (ref >60)
Glucose, Bld: 91 mg/dL (ref 70–99)
Potassium: 3.2 mmol/L — ABNORMAL LOW (ref 3.5–5.1)
Sodium: 134 mmol/L — ABNORMAL LOW (ref 135–145)

## 2023-09-19 LAB — MAGNESIUM: Magnesium: 2.1 mg/dL (ref 1.7–2.4)

## 2023-09-19 MED ORDER — POTASSIUM CHLORIDE CRYS ER 20 MEQ PO TBCR
40.0000 meq | EXTENDED_RELEASE_TABLET | Freq: Every day | ORAL | Status: DC
  Administered 2023-09-19 – 2023-09-20 (×2): 40 meq via ORAL
  Filled 2023-09-19 (×2): qty 2

## 2023-09-19 MED ORDER — BREXPIPRAZOLE 0.25 MG PO TABS
0.2500 mg | ORAL_TABLET | Freq: Every day | ORAL | Status: DC
  Administered 2023-09-19 – 2023-09-23 (×5): 0.25 mg via ORAL
  Filled 2023-09-19 (×6): qty 1
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
## 2023-09-19 NOTE — TOC Progression Note (Signed)
Transition of Care [facility]) - Progression Note    Patient Details  Name: Darlene Ballard MRN: [id number] Date of Birth: Dec 02, 1945  Transition of [facility]) CM/SW Contact  Darleene Cleaver, [place] Phone Number: 09/19/2023, 6:17 PM  Clinical Narrative:     Patient's daughter has appealed SNF denial to [employer].  TOC still waiting on outcome from daughter's appeal.  TOC to continue to follow patient's progress throughout discharge planning.   Expected Discharge Plan: Home w Home Health Services (vs. SNF) Barriers to Discharge: Continued Medical Work up, English as a second language teacher, SNF Pending bed offer  Expected Discharge Plan and Services In-house Referral: Clinical Social Work     Living arrangements for the past 2 months: Single Family Home                                       Social Determinants of Health (SDOH) Interventions SDOH Screenings   Food Insecurity: No Food Insecurity (09/13/2023)  Housing: Low Risk  (09/13/2023)  Transportation Needs: No Transportation Needs (09/13/2023)  Utilities: Not At Risk (09/13/2023)  Alcohol Screen: Low Risk  (05/19/2023)  Depression (PHQ2-9): Low Risk  (05/19/2023)  Financial Resource Strain: Low Risk  (05/19/2023)  Physical Activity: Sufficiently Active (05/19/2023)  Social Connections: Moderately Isolated (05/19/2023)  Stress: No Stress Concern Present (05/19/2023)  Tobacco Use: Low Risk  (09/13/2023)  Health Literacy: Adequate Health Literacy (05/19/2023)    Readmission Risk Interventions    09/15/2023    3:03 PM  Readmission Risk Prevention Plan  Transportation Screening Complete  PCP or Specialist Appt within 5-7 Days Complete  Home Care Screening Complete  Medication Review (RN CM) Complete

## 2023-09-20 DIAGNOSIS — G9349 Other encephalopathy: Secondary | ICD-10-CM | POA: Diagnosis not present

## 2023-09-20 DIAGNOSIS — B999 Unspecified infectious disease: Secondary | ICD-10-CM | POA: Diagnosis not present
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
## 2023-09-20 NOTE — Plan of Care (Signed)
?  Problem: Coping: ?Goal: Level of anxiety will decrease ?Outcome: Progressing ?  ?Problem: Safety: ?Goal: Ability to remain free from injury will improve ?Outcome: Progressing ?  ?

## 2023-09-21 DIAGNOSIS — B999 Unspecified infectious disease: Secondary | ICD-10-CM | POA: Diagnosis not present

## 2023-09-21 DIAGNOSIS — G9349 Other encephalopathy: Secondary | ICD-10-CM | POA: Diagnosis not present

## 2023-09-21 LAB — BASIC METABOLIC PANEL
Anion gap: 6 (ref 5–15)
BUN: 16 mg/dL (ref 8–23)
CO2: 28 mmol/L (ref 22–32)
Calcium: 8.6 mg/dL — ABNORMAL LOW (ref 8.9–10.3)
Chloride: 103 mmol/L (ref 98–111)
Creatinine, Ser: 0.52 mg/dL (ref 0.44–1.00)
GFR, Estimated: >60 mL/min (ref >60)
Glucose, Bld: 94 mg/dL (ref 70–99)
Potassium: 4.3 mmol/L (ref 3.5–5.1)
Sodium: 137 mmol/L (ref 135–145)

## 2023-09-21 NOTE — Progress Notes (Signed)
Brief note-await outcome of decision of SNF denial appeal Patient stable at this time Full note to follow-patient stable for discharge whenever able to sort out disposition-social worker aware   Pleas Koch, MD [facility] 1:57 PM
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
## 2023-09-21 NOTE — Plan of Care (Signed)
  Problem: Coping: Goal: Level of anxiety will decrease Outcome: Progressing   Problem: Pain Management: Goal: General experience of comfort will improve Outcome: Progressing   Problem: Safety: Goal: Ability to remain free from injury will improve Outcome: Progressing

## 2023-09-21 NOTE — Plan of Care (Signed)
  Problem: Coping: Goal: Level of anxiety will decrease Outcome: Progressing   Problem: Pain Management: Goal: General experience of comfort will improve Outcome: Progressing

## 2023-09-21 NOTE — Progress Notes (Signed)
Physical Therapy Treatment Patient Details Name: Darlene Ballard MRN: [id number] DOB: Jul 14, 1946 Today's Date: 09/21/2023   History of Present Illness 77 y.o. female who presents to [facility] due to diarrhea.  C difficile negative.  medical history significant of anxiety, dementia, hypothyroidism    PT Comments  General Comments: disorientation, confusion, required redirection to tasks, required cues for memory, required intermittent repetition of prompts to follow simple commands, friendly.  Appears at baseline. Assisted with amb in hallway a great distance.  Pt requires Min/Contact Guard Assist for safety.  NO AD.   Pt appears at prior mobility level.  Will update/consult LPT.     If plan is discharge home, recommend the following:     Can travel by private vehicle     Yes  Equipment Recommendations  None recommended by PT    Recommendations for Other Services       Precautions / Restrictions Precautions Precautions: Fall Precaution Comments: Hx Alzheimers Restrictions Weight Bearing Restrictions: No     Mobility  Bed Mobility               General bed mobility comments: OOB in recliner    Transfers Overall transfer level: Needs assistance Equipment used: Rolling walker (2 wheels) Transfers: Sit to/from Stand Sit to Stand: Contact guard assist           General transfer comment: Pt performed sit to stand from slightly elevated surface, narrow BOS, did not use UEs to assist.    Ambulation/Gait Ambulation/Gait assistance: Min assist, Contact guard assist Gait Distance (Feet): 500 Feet Assistive device: 1 person hand held assist Gait Pattern/deviations: Step-through pattern, Decreased stride length, Trunk flexed, Narrow base of support, Decreased step length - right, Decreased step length - left Gait velocity: decreased     General Gait Details: Lateral sway bilaterally, unsteady expecially when pt speeds up, cues to slow down at times. Pt gets distracted very  easily, which causes even more unsteadiness.   Stairs             Wheelchair Mobility     Tilt Bed    Modified Rankin (Stroke Patients Only)       Balance                                            Cognition Arousal: Alert Behavior During Therapy: WFL for tasks assessed/performed, Impulsive Overall Cognitive Status: History of cognitive impairments - at baseline                                 General Comments: disorientation, confusion, required redirection to tasks, required cues for memory, required intermittent repetition of prompts to follow simple commands, friendly.  Appears at baseline.        Exercises      General Comments        Pertinent Vitals/Pain Pain Assessment Pain Assessment: No/denies pain    Home Living                          Prior Function            PT Goals (current goals can now be found in the care plan section) Progress towards PT goals: Progressing toward goals    Frequency    Min 1X/week  PT Plan      Co-evaluation              AM-PAC PT "6 Clicks" Mobility   Outcome Measure  Help needed turning from your back to your side while in a flat bed without using bedrails?: None Help needed moving from lying on your back to sitting on the side of a flat bed without using bedrails?: None Help needed moving to and from a bed to a chair (including a wheelchair)?: A Little Help needed standing up from a chair using your arms (e.g., wheelchair or bedside chair)?: A Little Help needed to walk in hospital room?: A Little Help needed climbing 3-5 steps with a railing? : A Lot 6 Click Score: 19    End of Session Equipment Utilized During Treatment: Gait belt Activity Tolerance: Patient tolerated treatment well Patient left: with bed alarm set;in chair;with chair alarm set Nurse Communication: Mobility status PT Visit Diagnosis: Other abnormalities of gait and mobility  (R26.89);Difficulty in walking, not elsewhere classified (R26.2)     Time: 4098-1191 PT Time Calculation (min) (ACUTE ONLY): 17 min  Charges:    $Gait Training: 8-22 mins PT General Charges $$ ACUTE PT VISIT: 1 Visit                     Felecia Shelling  PTA Acute  Rehabilitation Services Office M-F          [phone]

## 2023-09-22 DIAGNOSIS — B999 Unspecified infectious disease: Secondary | ICD-10-CM | POA: Diagnosis not present

## 2023-09-22 DIAGNOSIS — G9349 Other encephalopathy: Secondary | ICD-10-CM | POA: Diagnosis not present

## 2023-09-22 MED ORDER — HYDROXYZINE HCL 10 MG PO TABS
10.0000 mg | ORAL_TABLET | Freq: Once | ORAL | Status: AC | PRN
  Administered 2023-09-24: 10 mg via ORAL
  Filled 2023-09-22: qty 1

## 2023-09-22 MED ORDER — MELATONIN 5 MG PO TABS
5.0000 mg | ORAL_TABLET | Freq: Once | ORAL | Status: AC | PRN
  Administered 2023-09-22: 5 mg via ORAL
  Filled 2023-09-22: qty 1
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
## 2023-09-22 NOTE — Progress Notes (Signed)
Occupational Therapy Treatment Patient Details Name: Darlene Ballard MRN: [id number] DOB: 06/09/1946 Today's Date: 09/22/2023   History of present illness 77 yr old female who presents to [facility] ED due to diarrhea.  C difficile negative.  Medical history significant of anxiety, dementia, hypothyroidism   OT comments  Pt presented with good physical abilities, though mostly limited by impaired cognition, with deficits relative to memory, attention, insight, and problem solving. She was able to ambulate in her room without an assistive device, as well as perform toileting at bathroom level and grooming standing at the sink; increased cues required throughout due to her cognition/confusion/disorientation. She will needed significant daily supervision at discharge, due to such cognitive limitations. If pt's is unable to have increased supervision, then memory care/long-term care may be an appropriate consideration. Continue OT plan of care.       If plan is discharge home, recommend the following:  Direct supervision/assist for medications management;Assistance with cooking/housework;Assist for transportation;Direct supervision/assist for financial management;A little help with bathing/dressing/bathroom   Equipment Recommendations  None recommended by OT    Recommendations for Other Services      Precautions / Restrictions Precautions Precautions: Fall Restrictions Weight Bearing Restrictions: No       Mobility Bed Mobility Overal bed mobility: Needs Assistance Bed Mobility: Supine to Sit     Supine to sit: Supervision Sit to supine: Supervision        Transfers Overall transfer level: Needs assistance Equipment used: None Transfers: Sit to/from Stand Sit to Stand: Contact guard assist                     ADL either performed or assessed with clinical judgement   ADL   Eating/Feeding: Set up;Sitting     Grooming Details (indicate cue type and reason): Pt observed  initiating feeding seated in chair, though needing cues for attention to task.             Lower Body Dressing: Minimal assistance;Bed level Lower Body Dressing Details (indicate cue type and reason): Pt donned socks long-sitting in bed, requiring min assist. Physical assist most likely needed, due to pt's decreased cognition, including impaired attention and problem solving. Toilet Transfer: Electronics engineer Details (indicate cue type and reason): The pt ambulated to and from the bathroom in her room without an assistive device. She required occasional steadying assist for transfer, as well as cues forshort term recall and attention to task. Toileting- Clothing Manipulation and Hygiene: Contact guard assist;Cueing for sequencing;Sit to/from stand (The pt required cues for thoroughness when performing hygiene, as she was distractible. She also required intermittent steadying assist and cues for safety awareness and short term memory.)                         Cognition Arousal: Alert   Overall Cognitive Status: History of cognitive impairments - at baseline Area of Impairment: Memory, Attention, Problem solving, Safety/judgement    Memory: Decreased short-term memory   Safety/Judgement: Decreased awareness of safety     General Comments: disorientation, confusion, required redirection to tasks, required cues for memory, required intermittent repetition of prompts to follow simple commands, friendly.  Appears at baseline.                   Pertinent Vitals/ Pain       Pain Assessment Pain Assessment: No/denies pain         Frequency  Min 1X/week  Progress Toward Goals  OT Goals(current goals can now be found in the care plan section)  Progress towards OT goals: Progressing toward goals  Acute Rehab OT Goals Patient Stated Goal: pt unable to state due to confusion OT Goal Formulation: Patient unable to participate in  goal setting Potential to Achieve Goals: Fair  Plan         AM-PAC OT "6 Clicks" Daily Activity     Outcome Measure   Help from another person eating meals?: None Help from another person taking care of personal grooming?: A Little Help from another person toileting, which includes using toliet, bedpan, or urinal?: A Little Help from another person bathing (including washing, rinsing, drying)?: A Little Help from another person to put on and taking off regular upper body clothing?: A Little Help from another person to put on and taking off regular lower body clothing?: A Little 6 Click Score: 19    End of Session Equipment Utilized During Treatment: Other (comment)  OT Visit Diagnosis: Unsteadiness on feet (R26.81);Other symptoms and signs involving cognitive function   Activity Tolerance Patient tolerated treatment well   Patient Left in chair;with call bell/phone within reach;with chair alarm set   Nurse Communication Other (comment) (pt up in chair eating dinner)        Time: 1610-9604 OT Time Calculation (min): 16 min  Charges: OT General Charges $OT Visit: 1 Visit OT Treatments $Self Care/Home Management : 8-22 mins    Reuben Likes, OTR/L 09/22/2023, 5:44 PM

## 2023-09-23 DIAGNOSIS — B999 Unspecified infectious disease: Secondary | ICD-10-CM | POA: Diagnosis not present

## 2023-09-23 DIAGNOSIS — G9349 Other encephalopathy: Secondary | ICD-10-CM | POA: Diagnosis not present

## 2023-09-23 MED ORDER — CHOLESTYRAMINE LIGHT 4 G PO PACK
2.0000 g | PACK | Freq: Once | ORAL | Status: AC
  Administered 2023-09-23: 2 g via ORAL
  Filled 2023-09-23: qty 1

## 2023-09-23 NOTE — Progress Notes (Signed)
PT Cancellation Note  Patient Details Name: Darlene Ballard MRN: [id number] DOB: 16-Feb-1946   Cancelled Treatment:    Reason Eval/Treat Not Completed: Other (comment); pt just got supper, dtr present and encouraging pt to eat. Will attempt again as schedule allows.    [facility] 09/23/2023, 5:09 PM

## 2023-09-23 NOTE — Plan of Care (Signed)
  Problem: Clinical Measurements: Goal: Will remain free from infection Outcome: Progressing Goal: Respiratory complications will improve Outcome: Progressing   Problem: Activity: Goal: Risk for activity intolerance will decrease Outcome: Progressing   Problem: Coping: Goal: Level of anxiety will decrease Outcome: Progressing   

## 2023-09-23 NOTE — Plan of Care (Signed)
  Problem: Coping: Goal: Level of anxiety will decrease Outcome: Progressing   Problem: Pain Management: Goal: General experience of comfort will improve Outcome: Progressing   Problem: Elimination: Goal: Will not experience complications related to bowel motility Outcome: Progressing Goal: Will not experience complications related to urinary retention Outcome: Progressing   Problem: Safety: Goal: Ability to remain free from injury will improve Outcome: Progressing

## 2023-09-23 NOTE — Progress Notes (Signed)
[facility] PROGRESS NOTE  Patient: Darlene Ballard GNF:[id number]   PCP: Jac Canavan, PA-C DOB: [birth year]   DOA: 09/13/2023   DOS: 09/23/2023    Subjective: Denies any acute complaint.  No nausea no vomiting.  Somewhat confused.  No hallucination at the time of my evaluation.  No diarrhea today but tach who had the patient yesterday reported that she had 2 episodes of loose BM which would not qualify for diarrhea.  Oral intake is not adequate.  Objective:  Vitals:   09/22/23 2223 09/22/23 2226 09/23/23 0543 09/23/23 1327  BP: 117/84 124/76 112/68 91/61  Pulse: 64 61 62 61  Resp: 16 16 17 16   Temp: 98.1 F (36.7 C) 98.1 F (36.7 C) 97.9 F (36.6 C) 97.8 F (36.6 C)  TempSrc:   Oral Oral  SpO2: 100% 100% 99% 100%  Weight:      Height:       Patient is alert and awake. Patient allowed examination. Very cooperative and no agitation so far at the time of my evaluation. At the request of the daughter patient was reevaluated later in the day and while she was confused no evidence of agitation seen at the time as well.  Assessment and plan: Reported diarrhea. Patient has 2 loose BM yesterday which does not qualify for definition of diarrhea. Currently has Imodium. Had extensive workup during the hospitalization. Will add Questran and monitor.  Daughter's request for UA. At present patient is agitations appears to be more delirium in the setting of dementia. At present there is no concern for an active infection. There is no indication to check UA every time patient has an agitation episode of confusion episode. Will monitor the patient clinically.  Disposition. Patient remains medically stable for now. CSW following. It appears that appeal has been still pending.  Author: Lynden Oxford, MD [facility] 09/23/2023 7:44 PM   If 7PM-7AM, please contact night-coverage at [email or web address]

## 2023-09-24 DIAGNOSIS — F0390 Unspecified dementia without behavioral disturbance: Secondary | ICD-10-CM

## 2023-09-24 LAB — BASIC METABOLIC PANEL
Anion gap: 8 (ref 5–15)
BUN: 24 mg/dL — ABNORMAL HIGH (ref 8–23)
CO2: 25 mmol/L (ref 22–32)
Calcium: 8.7 mg/dL — ABNORMAL LOW (ref 8.9–10.3)
Chloride: 104 mmol/L (ref 98–111)
Creatinine, Ser: 0.65 mg/dL (ref 0.44–1.00)
GFR, Estimated: >60 mL/min (ref >60)
Glucose, Bld: 83 mg/dL (ref 70–99)
Potassium: 3.4 mmol/L — ABNORMAL LOW (ref 3.5–5.1)
Sodium: 137 mmol/L (ref 135–145)

## 2023-09-24 LAB — MAGNESIUM: Magnesium: 2.2 mg/dL (ref 1.7–2.4)

## 2023-09-24 MED ORDER — BREXPIPRAZOLE 1 MG PO TABS
0.5000 mg | ORAL_TABLET | Freq: Every day | ORAL | Status: DC
  Administered 2023-09-24 – 2023-09-25 (×2): 0.5 mg via ORAL
  Filled 2023-09-24 (×2): qty 0.5

## 2023-09-24 MED ORDER — PROSOURCE PLUS PO LIQD
30.0000 mL | Freq: Two times a day (BID) | ORAL | Status: DC
Start: 2023-09-24 — End: 2023-09-25
  Administered 2023-09-25: 30 mL via ORAL
  Filled 2023-09-24 (×2): qty 30

## 2023-09-24 MED ORDER — POTASSIUM CHLORIDE CRYS ER 20 MEQ PO TBCR
20.0000 meq | EXTENDED_RELEASE_TABLET | Freq: Once | ORAL | Status: AC
  Administered 2023-09-24: 20 meq via ORAL
  Filled 2023-09-24: qty 1

## 2023-09-24 MED ORDER — ADULT MULTIVITAMIN W/MINERALS CH
1.0000 | ORAL_TABLET | Freq: Every day | ORAL | Status: DC
  Administered 2023-09-24 – 2023-09-25 (×2): 1 via ORAL
  Filled 2023-09-24 (×2): qty 1

## 2023-09-24 MED ORDER — RISAQUAD PO CAPS
2.0000 | ORAL_CAPSULE | Freq: Every day | ORAL | Status: DC
  Administered 2023-09-25: 2 via ORAL
  Filled 2023-09-24: qty 2

## 2023-09-24 NOTE — Progress Notes (Signed)
[facility] Progress Note Patient: Darlene Ballard ZOX:[id number] DOB: Mar 18, 1946 DOA: 09/13/2023  DOS: the patient was seen and examined on 09/24/2023  Brief hospital course: PMH of dementia, hypothyroidism, anxiety, unintentional weight loss likely failure to thrive presented to the hospital with complaints of diarrhea. 07/23/2023 seen by urology.  Urine cultures were sent. Urine culture grew 10,000 E. coli.  Not significant amount. 11/16 seen in the ED for agitation and confusion.  Was given oral Keflex for possible UTI. 11/18 starts having diarrhea 12/1 brought to the ED for diarrhea. 12/2 so far no diarrhea in the hospital.  Somewhat agitated but close to baseline.  Medically stable. 12/3 so far no diarrhea.  Medically stable. 12/12 patient ambulated 500 feet with PT with supervision without any assistance.  PT signed off. Assessment and Plan: Diarrhea. So far no diarrhea On admission CT abdomen negative for any acute abnormality. Given the duration of the diarrhea her serum creatinine has been remarkably normal Patient does not appear clinically dehydrated. C. difficile negative.  GI pathogen panel negative. For now we will use Imodium for diarrhea control.  Probiotics were added Florastor.  Will switch to acidophilus for ease of administration. Received Questran as well.  Frequent concerns for UTI. Asymptomatic bacteriuria. On admission patient was started on IV antibiotic thinking that she actually has some UTI. Antibiotics were discontinued.  As the patient did not have any evidence of infection. No leukocytosis, no fever, no tachycardia, no hypotension. No evidence of dehydration. Urinalysis unremarkable. Urine culture 60,000 colonies of Pseudomonas on 12/1. Patient clinically improving without antibiotic. Daughter raised another concern on 12/11 that the patient's agitation episode was associated with a "UTI".  Tried to educate daughter at episodic agitation that gets  better without any intervention does not represent an active infection.  And currently does not require any further analysis.  Dementia with behavioral issues. Appears to be close to baseline for her memory issues. Has seen neurology in September. Currently on Prozac 10 mg.  Per neuronote unable to tolerate olanzapine. Rexulti was recommended but not prescribed. Currently has been on Rexulti 0.25 mg and tolerating it well with some intermittent episodes of agitation. Will increase it to 0.5 mg, the ideal starting dose. For now no uncontrolled behaviors seen in the hospital. Daughter is concerned with regards to hallucination although it appears that in September visit neurology note mentions with regards of hallucination" She may think a person or animal may be there. "  Hypokalemia. Treated with oral potassium supplements. Likely from poor p.o. intake rather than GI losses.   Hypothyroidism. Continue Synthroid. TSH is suppressed.  Free T4 is normal. If diarrhea continues or agitation continues would recommend to lower the Synthroid from 88 mcg to 75 mcg but for now we will continue 88.   Concern for dehydration. As of right now patient is adequately hydrated.  Continue to encourage oral hydration.  Goals of care conversation. Discussed with daughter earlier in the admission with regards to advanced directives. Patient's prognosis is poor should she have a cardiac arrest. Daughter wants to focus on quality of life but wants to continue full code for now. Recommend palliative care consultation outpatient.   History of anxiety. Has been on Prozac 10 mg daily. Will continue.  Subjective: No nausea no vomiting no fever no chills.  No abdominal pain.  No diarrhea reported.  1 BM so far today.  Physical Exam: Alert and awake.  Oriented to self. In no distress. No rash. Bowel sound present.  Nontender. Was eating her lunch at the time of my evaluation. Prior to this work with  physical therapy and ambulated the hallway 500 feet.  Data Reviewed: I have Reviewed nursing notes, Vitals, and Lab results. Since last encounter, pertinent lab results BMP and magnesium   . I have ordered test including BMP  .   Disposition: Status is: Inpatient Remains inpatient appropriate because: Monitor overnight for stability.  Potentially home on 12/13 as long as labs stable.  enoxaparin (LOVENOX) injection 40 mg Start: 09/13/23 2200 Place TED hose Start: 09/13/23 1522   Family Communication: Discussed with daughter on the phone. Level of care: Med-Surg   Vitals:   09/23/23 1327 09/23/23 2034 09/24/23 0532 09/24/23 1345  BP: 91/61 126/76 125/77 112/65  Pulse: 61 63 60 72  Resp: 16 18 18 19   Temp: 97.8 F (36.6 C) 97.8 F (36.6 C) 97.6 F (36.4 C) 98.1 F (36.7 C)  TempSrc: Oral Oral Oral Oral  SpO2: 100% 97% 100% 100%  Weight:      Height:         Author: Lynden Oxford, MD 09/24/2023 6:50 PM  Please look on [email or web address] to find out who is on call.

## 2023-09-24 NOTE — Progress Notes (Signed)
Physical Therapy Treatment Patient Details Name: Darlene Ballard MRN: [id number] DOB: Oct 25, 1945 Today's Date: 09/24/2023   History of Present Illness 77 y.o. female who presents to [facility] due to diarrhea.  C difficile negative.  medical history significant of anxiety, dementia, hypothyroidism    PT Comments  Pt amb self around room with Safety Sitter.  Assisted with amb in hallway went well.   General Gait Details: tolerated a great distance at Supervision level and NO AD.  Occassional "reach" to rails.  No overt LOB.  Appears at prior level. Will consult LPT.  Pt has met her mobility goals and appears at prior functional level.     If plan is discharge home, recommend the following: Help with stairs or ramp for entrance;Assist for transportation;Assistance with cooking/housework;A little help with bathing/dressing/bathroom;A little help with walking and/or transfers;Direct supervision/assist for financial management;Supervision due to cognitive status;Direct supervision/assist for medications management   Can travel by private vehicle     Yes  Equipment Recommendations  None recommended by PT    Recommendations for Other Services       Precautions / Restrictions Precautions Precautions: Fall Precaution Comments: Hx Alzheimers Restrictions Weight Bearing Restrictions Per Provider Order: No     Mobility  Bed Mobility Overal bed mobility: Needs Assistance Bed Mobility: Supine to Sit, Sit to Supine     Supine to sit: Supervision Sit to supine: Supervision   General bed mobility comments: self able    Transfers Overall transfer level: Needs assistance Equipment used: None Transfers: Sit to/from Stand Sit to Stand: Supervision           General transfer comment: requires NO physical assistance just VC's for direction Hx Alzheimers    Ambulation/Gait Ambulation/Gait assistance: Supervision Gait Distance (Feet): 500 Feet Assistive device: None Gait  Pattern/deviations: Step-through pattern, Decreased stride length, Trunk flexed, Decreased step length - right, Decreased step length - left Gait velocity: decreased     General Gait Details: tolerated a great distance at Supervision level and NO AD.  Occassional "reach" to rails.  No overt LOB.  Appears at prior level.   Stairs             Wheelchair Mobility     Tilt Bed    Modified Rankin (Stroke Patients Only)       Balance                                            Cognition Arousal: Alert Behavior During Therapy: WFL for tasks assessed/performed, Impulsive Overall Cognitive Status: History of cognitive impairments - at baseline Area of Impairment: Memory, Attention, Problem solving, Safety/judgement                     Memory: Decreased short-term memory   Safety/Judgement: Decreased awareness of safety     General Comments: appears at prior level        Exercises      General Comments        Pertinent Vitals/Pain Pain Assessment Pain Assessment: No/denies pain    Home Living                          Prior Function            PT Goals (current goals can now be found in the care plan section) Progress towards PT  goals: Progressing toward goals    Frequency    Min 1X/week      PT Plan      Co-evaluation              AM-PAC PT "6 Clicks" Mobility   Outcome Measure  Help needed turning from your back to your side while in a flat bed without using bedrails?: None Help needed moving from lying on your back to sitting on the side of a flat bed without using bedrails?: None Help needed moving to and from a bed to a chair (including a wheelchair)?: None Help needed standing up from a chair using your arms (e.g., wheelchair or bedside chair)?: None Help needed to walk in hospital room?: None Help needed climbing 3-5 steps with a railing? : A Little 6 Click Score: 23    End of Session  Equipment Utilized During Treatment: Gait belt Activity Tolerance: Patient tolerated treatment well Patient left: with bed alarm set;in chair;with chair alarm set;with family/visitor present Nurse Communication: Mobility status PT Visit Diagnosis: Other abnormalities of gait and mobility (R26.89);Difficulty in walking, not elsewhere classified (R26.2)     Time: 1152-1206 PT Time Calculation (min) (ACUTE ONLY): 14 min  Charges:    $Gait Training: 8-22 mins PT General Charges $$ ACUTE PT VISIT: 1 Visit                     Felecia Shelling  PTA Acute  Rehabilitation Services Office M-F          [phone]

## 2023-09-25 ENCOUNTER — Telehealth: Payer: Self-pay

## 2023-09-25 ENCOUNTER — Ambulatory Visit: Payer: PPO | Admitting: Medical

## 2023-09-25 ENCOUNTER — Encounter: Payer: Self-pay | Admitting: Medical

## 2023-09-25 VITALS — HR 74 | Temp 98.2°F | Wt 116.4 lb

## 2023-09-25 DIAGNOSIS — F03911 Unspecified dementia, unspecified severity, with agitation: Secondary | ICD-10-CM

## 2023-09-25 DIAGNOSIS — R195 Other fecal abnormalities: Secondary | ICD-10-CM | POA: Diagnosis not present

## 2023-09-25 DIAGNOSIS — F919 Conduct disorder, unspecified: Secondary | ICD-10-CM | POA: Diagnosis not present

## 2023-09-25 DIAGNOSIS — R300 Dysuria: Secondary | ICD-10-CM | POA: Diagnosis not present

## 2023-09-25 DIAGNOSIS — I10 Essential (primary) hypertension: Secondary | ICD-10-CM | POA: Diagnosis not present

## 2023-09-25 DIAGNOSIS — E039 Hypothyroidism, unspecified: Secondary | ICD-10-CM

## 2023-09-25 DIAGNOSIS — Z741 Need for assistance with personal care: Secondary | ICD-10-CM | POA: Diagnosis not present

## 2023-09-25 DIAGNOSIS — F0390 Unspecified dementia without behavioral disturbance: Secondary | ICD-10-CM | POA: Diagnosis not present

## 2023-09-25 DIAGNOSIS — R262 Difficulty in walking, not elsewhere classified: Secondary | ICD-10-CM

## 2023-09-25 LAB — BASIC METABOLIC PANEL
Anion gap: 8 (ref 5–15)
BUN: 18 mg/dL (ref 8–23)
CO2: 22 mmol/L (ref 22–32)
Calcium: 8.4 mg/dL — ABNORMAL LOW (ref 8.9–10.3)
Chloride: 104 mmol/L (ref 98–111)
Creatinine, Ser: 0.43 mg/dL — ABNORMAL LOW (ref 0.44–1.00)
GFR, Estimated: >60 mL/min (ref >60)
Glucose, Bld: 91 mg/dL (ref 70–99)
Potassium: 3.7 mmol/L (ref 3.5–5.1)
Sodium: 134 mmol/L — ABNORMAL LOW (ref 135–145)

## 2023-09-25 LAB — POCT URINALYSIS DIP (PROADVANTAGE DEVICE)
Bilirubin, UA: NEGATIVE
Blood, UA: NEGATIVE
Glucose, UA: NEGATIVE mg/dL
Ketones, POC UA: NEGATIVE mg/dL
Leukocytes, UA: NEGATIVE
Nitrite, UA: NEGATIVE
Protein Ur, POC: NEGATIVE mg/dL
Specific Gravity, Urine: 1.025
Urobilinogen, Ur: 0.2
pH, UA: 6 (ref 5.0–8.0)

## 2023-09-25 MED ORDER — REXULTI 0.5 MG PO TABS: 0.5000 mg | ORAL_TABLET | Freq: Every day | ORAL | 1 refills | Status: DC

## 2023-09-25 MED ORDER — SACCHAROMYCES BOULARDII 250 MG PO CAPS: 250.0000 mg | ORAL_CAPSULE | Freq: Two times a day (BID) | ORAL | 1 refills | Status: DC

## 2023-09-25 MED ORDER — RISAQUAD PO CAPS: 2.0000 | ORAL_CAPSULE | Freq: Every day | ORAL | Status: DC

## 2023-09-25 MED ORDER — LEVOTHYROXINE SODIUM 75 MCG PO TABS
75.0000 ug | ORAL_TABLET | Freq: Every day | ORAL | 1 refills | Status: DC
Start: 2023-09-25 — End: 2023-09-30

## 2023-09-25 MED ORDER — BREXPIPRAZOLE 0.5 MG PO TABS: 0.5000 mg | ORAL_TABLET | Freq: Every day | ORAL | Status: DC

## 2023-09-25 MED ORDER — ADULT MULTIVITAMIN W/MINERALS CH: 1.0000 | ORAL_TABLET | Freq: Every day | ORAL | 0 refills | Status: DC

## 2023-09-25 NOTE — TOC Transition Note (Signed)
Transition of Care [facility]) - Discharge Note   Patient Details  Name: Darlene Ballard MRN: [id number] Date of Birth: [birth year]  [facility] of [facility]) CM/SW Contact:  Amada Jupiter, LCSW Phone Number: 09/25/2023, 11:07 AM   Clinical Narrative:     Spoke with pt's daughter this morning to review status of appeal and dc planning needs.  Family appeal resulted in upholding of initial denial and sent to "next level" with [employer] which could take 2 weeks for answer.  Per MD, pt is medically ready for dc and pt is now at her baseline/ supervision level of functioning.  Daughter states understanding that she is at supervision and ready for dc.  She cannot pick pt up until this afternoon.  Have alerted MD and RN.  No further TOC needs.  Final next level of care: Home/Self Care Barriers to Discharge: Barriers Resolved   Patient Goals and CMS Choice Patient states their goals for this hospitalization and ongoing recovery are:: Go home          Discharge Placement                       Discharge Plan and Services Additional resources added to the After Visit Summary for   In-house Referral: Clinical Social Work              DME Arranged: N/A DME Agency: NA       HH Arranged: NA HH Agency: NA        Social Drivers of Health (SDOH) Interventions SDOH Screenings   Food Insecurity: No Food Insecurity (09/13/2023)  Housing: Unknown (09/24/2023)  Transportation Needs: No Transportation Needs (09/13/2023)  Utilities: Not At Risk (09/13/2023)  Alcohol Screen: Low Risk  (05/19/2023)  Depression (PHQ2-9): Low Risk  (05/19/2023)  Financial Resource Strain: Low Risk  (05/19/2023)  Physical Activity: Sufficiently Active (05/19/2023)  Social Connections: Moderately Isolated (05/19/2023)  Stress: No Stress Concern Present (05/19/2023)  Tobacco Use: Low Risk  (09/13/2023)  Health Literacy: Adequate Health Literacy (05/19/2023)     Readmission Risk Interventions    09/15/2023    3:03 PM   Readmission Risk Prevention Plan  Transportation Screening Complete  PCP or Specialist Appt within 5-7 Days Complete  Home Care Screening Complete  Medication Review (RN CM) Complete

## 2023-09-25 NOTE — Progress Notes (Signed)
Occupational Therapy Treatment Patient Details Name: Darlene Ballard MRN: [id number] DOB: 08/04/1946 Today's Date: 09/25/2023   History of present illness 77 yr old female who presents to [facility] due to diarrhea.  C difficile negative.  Medical history significant of anxiety, dementia, hypothyroidism   OT comments  The pt was seen for ADL instruction and general progression of functional activity. She performed self-care/grooming tasks in standing at sink level. She performed tasks with SBA, though needing frequent verbal cues for short-term memory/recall and attention to tasks, as well as intermittent cues for sequencing and problem solving. She further ambulated to the bedside chair in her room without and assistive device with SBA. The pt may be near to her baseline level of functioning for ADLs & will likely only require 1 additional OT visit during her hospital stay. OT continues to recommend significant supervision vs. consideration of ALF/memory care upon hospital discharge.        If plan is discharge home, recommend the following:  Direct supervision/assist for financial management;Supervision due to cognitive status;Direct supervision/assist for medications management;A little help with bathing/dressing/bathroom;Assist for transportation;Assistance with cooking/housework   Equipment Recommendations  None recommended by OT    Recommendations for Other Services      Precautions / Restrictions Restrictions Weight Bearing Restrictions Per Provider Order: No             Transfers Overall transfer level: Needs assistance Equipment used: None Transfers:  (stand to sit) Sit to Stand: Supervision                     ADL either performed or assessed with clinical judgement   ADL Overall ADL's : Needs assistance/impaired Eating/Feeding: Set up;Sitting Eating/Feeding Details (indicate cue type and reason): OT did not observe pt self-feeding, however her sitter who was  present in the room stated the pt fed herself without assistance earlier in the the day. Grooming: Standing;Cueing for sequencing;Set up;Supervision/safety;Wash/dry hands;Wash/dry face;Oral care Grooming Details (indicate cue type and reason): OT provided ADL instruction for grooming completion with the pt in standing at sink level. She performed hand washing, hair combing, and teeth brushing, requring SBA and increased verbal cues for sustained attention to tasks, short-term recall, and occasional cues for sequencing and problem solving.                       Toileting - Clothing Manipulation Details (indicate cue type and reason): The pt was finishing up toileting tasks at bathroom level, upon this OT entering her room. Her sitter had been present and provided supervision for tasks.              Cognition Arousal: Alert Behavior During Therapy: Impulsive Overall Cognitive Status: History of cognitive impairments - at baseline Area of Impairment: Memory, Attention, Problem solving, Safety/judgement        Memory: Decreased short-term memory   Safety/Judgement: Decreased awareness of safety                         Pertinent Vitals/ Pain       Pain Assessment Pain Assessment: No/denies pain         Frequency  Min 1X/week        Progress Toward Goals  OT Goals(current goals can now be found in the care plan section)  Progress towards OT goals: Progressing toward goals  Acute Rehab OT Goals Patient Stated Goal: pt was unable to state due to impaired  cognition OT Goal Formulation: Patient unable to participate in goal setting Potential to Achieve Goals: Fair  Plan         AM-PAC OT "6 Clicks" Daily Activity     Outcome Measure   Help from another person eating meals?: None Help from another person taking care of personal grooming?: A Little Help from another person toileting, which includes using toliet, bedpan, or urinal?: A Little Help from  another person bathing (including washing, rinsing, drying)?: A Little Help from another person to put on and taking off regular upper body clothing?: A Little Help from another person to put on and taking off regular lower body clothing?: A Little 6 Click Score: 19    End of Session Equipment Utilized During Treatment: Other (comment) (N/A)  OT Visit Diagnosis: Other symptoms and signs involving cognitive function   Activity Tolerance Patient tolerated treatment well   Patient Left in chair;with call bell/phone within reach;with nursing/sitter in room   Nurse Communication Other (comment)        Time: 1610-9604 OT Time Calculation (min): 10 min  Charges: OT General Charges $OT Visit: 1 Visit OT Treatments $Self Care/Home Management : 8-22 mins    Reuben Likes, OTR/L 09/25/2023, 12:50 PM

## 2023-09-25 NOTE — Progress Notes (Signed)
Subjective:  Darlene Ballard is a 77 y.o. female who presents for Chief Complaint  Patient presents with   Urinary Tract Infection    Based on blood work. 60,000 bacteria in urine but doctor ignored. Has had 2 outbursts. Urologist concerned.      Here with daughter Darlene Ballard today.  She was hospitalized 09/13/2023.  Initial plan date of discharge was 09/18/2023 but given other concerns patient was actually discharged today 09/25/2023  She has a past medical history including dementia, hypothyroidism, anxiety  She was admitted with lots of diarrhea, incontinence of stool, loose stools frequently, worsening mood, and some difficulty walking and difficulty with ambulation that was new for her.  She was given IV fluids anti motility agents after bowel infection was ruled out  HOSPITAL COURSE:    Diarrhea. C. difficile testing was negative.  GI pathogen panel was negative.  CT scan of the abdomen did not show any acute findings.  She was given Imodium with improvement in diarrhea.  Has had some recurrence in the last 24 hours.  Was given Imodium for 3 doses.  Continue probiotics.  Abdomen remains benign on examination.  May need benefit from GI consultation if diarrhea does not get better in 2 to 3 weeks.   Hypokalemia. Likely from GI loss as well as poor oral intake.  Supplemented.  Magnesium 2.0.  Recheck labs on discharge.   Hypothyroidism. Continue Synthroid. TSH is suppressed.  Free T4 is normal.   Dementia with behavioral issues. Appears to be close to baseline for her memory issues. Has seen neurology in September. Currently on Prozac 10 mg. Unable to tolerate olanzapine. Rexulti was recommended but not prescribed.  This has been initiated.   Behavior has been well-controlled here in the hospital.   Concern for UTI. Patient was started on IV antibiotic thinking that she actually has some UTI.  Subsequent rounding physicians did not think that patient had active UTI. Antibiotics  were thought to be responsible for her diarrhea so they were discontinued.   Urine culture is growing 60,000 colonies of Pseudomonas.  WBC is normal.  She is afebrile and asymptomatic.  No clear indication to continue with antibiotics at this time.  Daughter wondering if she needs urology consult.  Long discussion with daughter regarding the results of urine culture.  She was told about possibility of colonization in the absence of symptoms.  WBC has been normal.  She has been afebrile.  She was told that there was no clear indication for involving urology at this time.   Patient is stable.  Okay for discharge to SNF when bed is available.   Concerns today: She was having trouble walking with this hospitalization compared to her normal ability to ambulate well.  She was only been able to take short steps with assistance.  In the last several days she was getting physical therapy at the hospital and is doing better with her ambulation  She was begun on Rexulti to help with mood in the past week and a half but given some mood concerns Rexulti was increased today to 0.5 mg but she does not actually have a prescription at the pharmacy for this.   There were concerns Darlene Ballard had about her creatinine kidney marker.  It was fluctuating up and down  She wants to recheck urine today just to make sure there is no signs of infection as there was concern for urinary tract infection while in the hospital  Fortunately the last week and then  loose stool she had was 3 days ago.  She had a bowel movement yesterday and no bowel movement today so far.  This was a big problem that was keeping her from coming off in the hospital given the inability to control stool throughout the day and night  She was not discharged with any prescriptions for any more probiotics.  So today would be her last probiotic and less she should continue this  She does continue on Prozac 10 mg daily.  She did not tolerate olanzapine  prior  Palliative care is supposed to be coming out to the house to do a consult.  Daughter is concerned about getting some respite care as well and some supplies  Darlene Ballard is eating sometimes in, sometimes not eating.  Appetite is not great.  She has seen a urologist, Dr. Marcha Solders here in [place] prior to the hospitalization.  No current follow-up scheduled.  No other aggravating or relieving factors.    No other c/o.  Past Medical History:  Diagnosis Date   Anxiety    Chronic knee pain    Dementia (HCC)    Hypothyroidism    Onychodystrophy    Sleep disturbance    Current Outpatient Medications on File Prior to Visit  Medication Sig Dispense Refill   acetaminophen (TYLENOL) 325 MG tablet Take 2 tablets (650 mg total) by mouth every 6 (six) hours as needed for mild pain (pain score 1-3) (or Fever >/= 101).     FLUoxetine (PROZAC) 10 MG capsule TAKE 1 CAPSULE (10 MG TOTAL) BY MOUTH DAILY. 30 capsule 1   melatonin 5 MG TABS Take 1 tablet (5 mg total) by mouth at bedtime.     Multiple Vitamin (MULTIVITAMIN WITH MINERALS) TABS tablet Take 1 tablet by mouth daily. 120 tablet 0   No current facility-administered medications on file prior to visit.     The following portions of the patient's history were reviewed and updated as appropriate: allergies, current medications, past family history, past medical history, past social history, past surgical history and problem list.  ROS Otherwise as in subjective above  Objective: Pulse 74   Temp 98.2 F (36.8 C) (Oral)   Wt 116 lb 6.4 oz (52.8 kg)   SpO2 96%   BMI 21.29 kg/m   General appearance: no distress, well developed, well nourished Patient seated at rest.  When asked a few times about a couple different things she replies with some conversation but is not appropriate related to questions asked    Assessment: Encounter Diagnoses  Name Primary?   Loose stools Yes   Dysuria    Ambulatory dysfunction    Dementia  with agitation, unspecified dementia severity, unspecified dementia type (HCC)    Behavior disturbance    Hypothyroidism, unspecified type    Hypertension, unspecified type    Self-care deficit for bathing and hygiene      Plan: Loose stools that fortunately resolved 3 days ago.  Continue probiotics for another couple weeks  Diet as tolerated, consider Ensure shakes  Difficulty with ambulation-improving with some recent physical therapy while inpatient.  Referral will be approved for palliative care consult  Dementia with behavioral disturbance and agitation-continue Prozac 10 mg daily and continue Rexulti 0.5 mg daily.  Follow-up with neurology soon in about a month  Hypothyroidism-given recent labs we will decrease to 75 mcg daily    Marqui was seen today for urinary tract infection.  Diagnoses and all orders for this visit:  Loose stools -  CBC; Future -     Basic metabolic panel; Future -     Magnesium; Future  Dysuria -     Urine Culture -     POCT Urinalysis DIP (Proadvantage Device) -     CBC; Future -     Basic metabolic panel; Future -     Magnesium; Future  Ambulatory dysfunction -     CBC; Future -     Basic metabolic panel; Future -     Magnesium; Future  Dementia with agitation, unspecified dementia severity, unspecified dementia type (HCC) -     CBC; Future -     Basic metabolic panel; Future -     Magnesium; Future  Behavior disturbance -     CBC; Future -     Basic metabolic panel; Future -     Magnesium; Future  Hypothyroidism, unspecified type -     CBC; Future -     Basic metabolic panel; Future -     Magnesium; Future  Hypertension, unspecified type -     CBC; Future -     Basic metabolic panel; Future -     Magnesium; Future  Self-care deficit for bathing and hygiene -     CBC; Future -     Basic metabolic panel; Future -     Magnesium; Future  Other orders -     saccharomyces boulardii (FLORASTOR) 250 MG capsule; Take 1  capsule (250 mg total) by mouth 2 (two) times daily. -     Brexpiprazole (REXULTI) 0.5 MG TABS; Take 1 tablet (0.5 mg total) by mouth daily. -     levothyroxine (SYNTHROID) 75 MCG tablet; Take 1 tablet (75 mcg total) by mouth daily.    Follow up: pending labs

## 2023-09-25 NOTE — Telephone Encounter (Signed)
Notified Drenda Freeze from [facility] [place]

## 2023-09-25 NOTE — Plan of Care (Signed)
  Problem: Coping: Goal: Level of anxiety will decrease Outcome: Progressing   Problem: Pain Management: Goal: General experience of comfort will improve Outcome: Progressing   Problem: Safety: Goal: Ability to remain free from injury will improve Outcome: Progressing

## 2023-09-25 NOTE — Progress Notes (Signed)
Daughter is here to take patient home, the patient is irritated  and talking about what the people in the room are doing. Discharge done with Daughter and patient is getting dressed for discharge. Sharrell Ku RN

## 2023-09-25 NOTE — Telephone Encounter (Signed)
Drenda Freeze from [facility] [place] called for verbal orders to start palliative care.

## 2023-09-27 LAB — URINE CULTURE

## 2023-09-28 ENCOUNTER — Telehealth: Payer: Self-pay | Admitting: Physician Assistant

## 2023-09-28 ENCOUNTER — Encounter: Payer: Self-pay | Admitting: Internal Medicine

## 2023-09-28 NOTE — Telephone Encounter (Signed)
Pt's daughter called in and left a message with the access nurse on 09/25/23. She states the pt is not able to pay $400 a month for Rexulti medication. She would like a different medication to be prescribed. It was working. Pt discharged from hospital today.

## 2023-09-28 NOTE — Telephone Encounter (Signed)
Spoke to daughter who has guardianship of patient. She stated that patient went to the hospital and the doctor put her on Rexulti and patient was doing good until the last two days on it when she starting having mood issues. The hospital didn't send a script of Rexultii for refills and patients PCP was contacted about it. Patients PCP Dr. Aleen Campi stated that he will refill the Rexultii just ONCE, but any further refill will have to be from the Neurologist. The price of Rexultii was very expensive and patients daughter wants to know if there is anything similar she can use? Because the cost of Rexultii is too high.   Patients daughter will take the appointment on  10/29/22 at 9:30 but would like to be seen before 10/25/22 due to court with patiens husband for guardianship.

## 2023-09-28 NOTE — Discharge Summary (Signed)
Physician Discharge Summary   Patient: Darlene Ballard MRN: [id number] DOB: 09/09/1946  Admit date:     09/13/2023  Discharge date: 09/25/2023  Discharge Physician: Lynden Oxford  PCP: Jac Canavan, PA-C  Recommendations at discharge: Follow-up with PCP as recommended.   Follow-up Information     Tysinger, Kermit Balo, PA-C. Schedule an appointment as soon as possible for a visit in 1 week(s).   Specialty: Family Medicine Why: post hospitalization follow up Contact information: [address] [place] [address] [phone]                  Discharge Diagnoses: Active Problems:   Hypertension   Hyperlipidemia   Dementia with behavioral disturbance (HCC)   Hypothyroidism   Self-care deficit for bathing and hygiene   Behavior disturbance  Brief hospital course: PMH of dementia, hypothyroidism, anxiety, unintentional weight loss likely failure to thrive presented to the hospital with complaints of diarrhea. 07/23/2023 seen by urology.  Urine cultures were sent. Urine culture grew 10,000 E. coli.  Not significant amount. 11/16 seen in the ED for agitation and confusion.  Was given oral Keflex for possible UTI. 11/18 starts having diarrhea 12/1 brought to the ED for diarrhea. 12/2 so far no diarrhea in the hospital.  Somewhat agitated but close to baseline.  Medically stable. 12/3 so far no diarrhea.  Medically stable. 12/12 patient ambulated 500 feet with PT with supervision without any assistance.  PT signed off.  Assessment and Plan: Diarrhea. So far no diarrhea in the hospital. On admission CT abdomen negative for any acute abnormality. Given the duration of the diarrhea her serum creatinine has been remarkably normal Patient does not appear clinically dehydrated. C. difficile negative.  GI pathogen panel negative. For now we will use Imodium for reported diarrhea control.  Probiotics were added Florastor.  Will switch to acidophilus for ease of  administration. Received Questran as well.   Frequent concerns for UTI. Asymptomatic bacteriuria. On admission patient was started on IV antibiotic thinking that she actually has some UTI. Antibiotics were discontinued.  As the patient did not have any evidence of infection. No leukocytosis, no fever, no tachycardia, no hypotension. No evidence of dehydration. Urinalysis unremarkable. Urine culture 60,000 colonies of Pseudomonas on 12/1. Patient clinically improving without antibiotic. Daughter raised another concern on 12/11 that the patient's agitation episode was associated with a "UTI".  Tried to educate daughter at episodic agitation that gets better without any intervention does not represent an active infection.  And currently does not require any further analysis.   Dementia with behavioral issues. Appears to be close to baseline for her memory issues. Has seen neurology in September. Currently on Prozac 10 mg.  Per neuronote unable to tolerate olanzapine. Rexulti was recommended but not prescribed. Currently has been on Rexulti 0.25 mg and tolerating it well with some intermittent episodes of agitation. Will increase it to 0.5 mg, the recommended starting dose. For now no uncontrolled behaviors seen in the hospital. Daughter is concerned with regards to hallucination although it appears that in September visit neurology note mentions with regards of hallucination" She may think a person or animal may be there. "   Hypokalemia. Treated with oral potassium supplements. Likely from poor p.o. intake rather than GI losses.   Hypothyroidism. Continue Synthroid. TSH is suppressed.  Free T4 is normal. If diarrhea continues or agitation continues would recommend to lower the Synthroid from 88 mcg to 75 mcg but for now we will continue 88.  Concern for dehydration. As of right now patient is adequately hydrated.  Continue to encourage oral hydration.   Goals of care  conversation. Discussed with daughter earlier in the admission with regards to advanced directives. Patient's prognosis is poor should she have a cardiac arrest. Daughter wants to focus on quality of life but wants to continue full code for now. Recommend palliative care consultation outpatient.    History of anxiety. Has been on Prozac 10 mg daily. Will continue.  PT felt that patient is at her baseline.  Patient did not require any assistance while ambulating around the hallway.  On next day patient remained independent able to feed herself.  It was felt that the patient is medically stable for discharge from the hospital back to her home environment as currently there is no skilled needs identified.  Patient had 24/7 support with family at home and during the daytime she was going to daycare which she should continue going forward.  Consultants:  None  Procedures performed:  None  DISCHARGE MEDICATION: Allergies as of 09/25/2023       Reactions   Yellow Jacket Venom Anaphylaxis, Swelling   Paxil [paroxetine] Other (See Comments)   "Anger issues"   Zoloft [sertraline] Other (See Comments)   "Anger issues"        Medication List     STOP taking these medications    cephALEXin 500 MG capsule Commonly known as: KEFLEX       TAKE these medications    acetaminophen 325 MG tablet Commonly known as: TYLENOL Take 2 tablets (650 mg total) by mouth every 6 (six) hours as needed for mild pain (pain score 1-3) (or Fever >/= 101).   FLUoxetine 10 MG capsule Commonly known as: PROZAC TAKE 1 CAPSULE (10 MG TOTAL) BY MOUTH DAILY.   melatonin 5 MG Tabs Take 1 tablet (5 mg total) by mouth at bedtime.   multivitamin with minerals Tabs tablet Take 1 tablet by mouth daily.       Disposition: Home Diet recommendation: Regular diet  Discharge Exam: Vitals:   09/23/23 2034 09/24/23 0532 09/24/23 1345 09/24/23 2241  BP: 126/76 125/77 112/65 124/89  Pulse: 63 60 72 (!) 57   Resp: 18 18 19 18   Temp: 97.8 F (36.6 C) 97.6 F (36.4 C) 98.1 F (36.7 C)   TempSrc: Oral Oral Oral   SpO2: 97% 100% 100% 100%  Weight:      Height:       General: Appear in no distress; no visible Abnormal Neck Mass Or lumps, Conjunctiva normal Cardiovascular: S1 and S2 Present, no Murmur, Respiratory: good respiratory effort, Bilateral Air entry present and CTA, no Crackles, no wheezes Abdomen: Bowel Sound present, Non tender  Extremities: no Pedal edema Neurology: alert and oriented to self no focal deficit. Filed Weights   09/13/23 0746  Weight: 51.3 kg   Condition at discharge: stable  The results of significant diagnostics from this hospitalization (including imaging, microbiology, ancillary and laboratory) are listed below for reference.   Imaging Studies: CT ABDOMEN PELVIS W CONTRAST Result Date: 09/13/2023 CLINICAL DATA:  Abdominal pain and diarrhea for approximately 2 weeks. EXAM: CT ABDOMEN AND PELVIS WITH CONTRAST TECHNIQUE: Multidetector CT imaging of the abdomen and pelvis was performed using the standard protocol following bolus administration of intravenous contrast. RADIATION DOSE REDUCTION: This exam was performed according to the departmental dose-optimization program which includes automated exposure control, adjustment of the mA and/or kV according to patient size and/or use of iterative reconstruction  technique. CONTRAST:  OMNIPAQUE IOHEXOL 300 MG/ML  SOLN COMPARISON:  06/17/2023 FINDINGS: Lower Chest: No acute findings. Previously seen left pleural effusion has resolved. Hepatobiliary: No suspicious hepatic masses identified. Gallbladder is unremarkable. No evidence of biliary ductal dilatation. Pancreas:  No mass or inflammatory changes. Spleen: Within normal limits in size and appearance. Adrenals/Urinary Tract: No suspicious masses identified. No evidence of ureteral calculi or hydronephrosis. Stomach/Bowel: Small to moderate hiatal hernia is again seen.  No evidence of obstruction, bowel wall thickening or other inflammatory process, or abnormal fluid collections. Vascular/Lymphatic: No pathologically enlarged lymph nodes. No acute vascular findings. Reproductive: Several small calcified uterine fibroids are again noted, largest measuring 1.6 cm. A 2.4 cm benign-appearing cyst is again seen in the right adnexa, which remains stable. Other:  None. Musculoskeletal: No suspicious bone lesions identified. Mild wedge compression deformity of the T12 vertebral body is again noted. IMPRESSION: No acute findings. Small to moderate hiatal hernia. Small calcified uterine fibroids. Stable 2.4 cm benign-appearing right adnexal cyst. No follow-up imaging recommended. Reference: JACR 2020 Feb; 17(2):248-254 Electronically Signed   By: Danae Orleans M.D.   On: 09/13/2023 10:12    Microbiology: Results for orders placed or performed during the hospital encounter of 09/13/23  C Difficile Quick Screen w PCR reflex     Status: None   Collection Time: 09/13/23  9:39 AM   Specimen: STOOL  Result Value Ref Range Status   C Diff antigen NEGATIVE NEGATIVE Final   C Diff toxin NEGATIVE NEGATIVE Final   C Diff interpretation No C. difficile detected.  Final    Comment: Performed at [facility], [address] [place], [place] [address]  Gastrointestinal Panel by PCR , Stool     Status: None   Collection Time: 09/13/23  9:39 AM   Specimen: Stool  Result Value Ref Range Status   Campylobacter species NOT DETECTED NOT DETECTED Final   Plesimonas shigelloides NOT DETECTED NOT DETECTED Final   Salmonella species NOT DETECTED NOT DETECTED Final   Yersinia enterocolitica NOT DETECTED NOT DETECTED Final   Vibrio species NOT DETECTED NOT DETECTED Final   Vibrio cholerae NOT DETECTED NOT DETECTED Final   Enteroaggregative E coli (EAEC) NOT DETECTED NOT DETECTED Final   Enteropathogenic E coli (EPEC) NOT DETECTED NOT DETECTED Final   Enterotoxigenic E coli  (ETEC) NOT DETECTED NOT DETECTED Final   Shiga like toxin producing E coli (STEC) NOT DETECTED NOT DETECTED Final   Shigella/Enteroinvasive E coli (EIEC) NOT DETECTED NOT DETECTED Final   Cryptosporidium NOT DETECTED NOT DETECTED Final   Cyclospora cayetanensis NOT DETECTED NOT DETECTED Final   Entamoeba histolytica NOT DETECTED NOT DETECTED Final   Giardia lamblia NOT DETECTED NOT DETECTED Final   Adenovirus F40/41 NOT DETECTED NOT DETECTED Final   Astrovirus NOT DETECTED NOT DETECTED Final   Norovirus GI/GII NOT DETECTED NOT DETECTED Final   Rotavirus A NOT DETECTED NOT DETECTED Final   Sapovirus (I, II, IV, and V) NOT DETECTED NOT DETECTED Final    Comment: Performed at [facility], [address] [place], [place] [address]  Urine Culture     Status: Abnormal   Collection Time: 09/13/23  8:08 PM   Specimen: Urine, Clean Catch  Result Value Ref Range Status   Specimen Description   Final    URINE, CLEAN CATCH Performed at [facility], [address], [place], [place] [address]    Special Requests   Final    NONE Performed at  [facility], [address] [place], [place] [address]    Culture 60,000 COLONIES/mL PSEUDOMONAS AERUGINOSA (A)  Final   Report Status 09/16/2023 FINAL  Final   Organism ID, Bacteria PSEUDOMONAS AERUGINOSA (A)  Final      Susceptibility   Pseudomonas aeruginosa - MIC*    CEFTAZIDIME 2 SENSITIVE Sensitive     CIPROFLOXACIN <=0.25 SENSITIVE Sensitive     GENTAMICIN <=1 SENSITIVE Sensitive     IMIPENEM 2 SENSITIVE Sensitive     PIP/TAZO <=4 SENSITIVE Sensitive ug/mL    CEFEPIME 2 SENSITIVE Sensitive     * 60,000 COLONIES/mL PSEUDOMONAS AERUGINOSA   Labs: CBC: No results for input(s): "WBC", "NEUTROABS", "HGB", "HCT", "MCV", "PLT" in the last 168 hours. Basic Metabolic Panel: Recent Labs  Lab 09/24/23 0345 09/25/23 0734  NA 137 134*  K 3.4* 3.7  CL 104 104  CO2 25 22  GLUCOSE 83 91  BUN 24* 18   CREATININE 0.65 0.43*  CALCIUM 8.7* 8.4*  MG 2.2  --    Liver Function Tests: No results for input(s): "AST", "ALT", "ALKPHOS", "BILITOT", "PROT", "ALBUMIN" in the last 168 hours. CBG: No results for input(s): "GLUCAP" in the last 168 hours.  Discharge time spent: greater than 30 minutes.  Author: Lynden Oxford, MD  [facility] 09/25/2023

## 2023-09-28 NOTE — Telephone Encounter (Signed)
Please ask her what medications she was taking before that were of help. Rexulti is the best but I understand is cost prohibitive. I have an opening on Dec 18 at 1 pm, it is the closest to the court date. Let us know if want to take it. Please ask her to come at 12:45 for checkin. Of note, make sure that we do not di any legal stuff.  We cannot declare her competent or incompetent however, as discussed priorly.that is done by a geriatric psychiatrist, Dr, Leanor Rubenstein.  We can only provide diagnosis if that is of help.

## 2023-09-28 NOTE — Progress Notes (Signed)
Results sent through MyChart

## 2023-09-28 NOTE — Progress Notes (Signed)
Sent a note to angela to have her swing by with [facility] to get updated labs

## 2023-09-29 NOTE — Telephone Encounter (Signed)
Dec 18 at 1 is available for an appt. Will call patient.

## 2023-09-30 ENCOUNTER — Encounter: Payer: Self-pay | Admitting: Physician Assistant

## 2023-09-30 ENCOUNTER — Ambulatory Visit: Payer: PPO | Admitting: Physician Assistant

## 2023-09-30 VITALS — BP 121/74 | HR 73 | Ht 62.0 in | Wt 115.0 lb

## 2023-09-30 DIAGNOSIS — G301 Alzheimer's disease with late onset: Secondary | ICD-10-CM | POA: Diagnosis not present

## 2023-09-30 DIAGNOSIS — F02818 Dementia in other diseases classified elsewhere, unspecified severity, with other behavioral disturbance: Secondary | ICD-10-CM

## 2023-09-30 MED ORDER — QUETIAPINE FUMARATE 25 MG PO TABS: ORAL_TABLET | ORAL | 3 refills | Status: DC

## 2023-09-30 NOTE — Patient Instructions (Addendum)
It was a pleasure to see you today at our office.   Recommendations:  Follow up in jan 21 at 11:30  Agree with  24/7 care  Continue Adult Day Program  Continue Prozac  as directed, consider increasing to 20 mg daily, may go up to 40 mg in the future if needed Start Seroquel 12.5 mg (half of the 25 mg tablet) at night, may increase if needed to a full tablet ( 25 mg) after 1 week    Whom to call:  Memory  decline, memory medications: Call our office [phone]   For psychiatric meds, mood meds: Please have your primary care physician manage these medications.      For assessment of decision of mental capacity and competency:  Call Dr. Erick Blinks, geriatric psychiatrist at [phone]  For guidance in geriatric dementia issues please call [facility] [phone]  Call [facility] [email or web address]    If you have any severe symptoms of a stroke, or other severe issues such as confusion,severe chills or fever, etc call 911 or go to the ER as you may need to be evaluated further     RECOMMENDATIONS FOR ALL PATIENTS WITH MEMORY PROBLEMS: 1. Continue to exercise (Recommend 30 minutes of walking everyday, or 3 hours every week) 2. Increase social interactions - continue going to [place] and enjoy social gatherings with friends and family 3. Eat healthy, avoid fried foods and eat more fruits and vegetables 4. Maintain adequate blood pressure, blood sugar, and blood cholesterol level. Reducing the risk of stroke and cardiovascular disease also helps promoting better memory. 5. Avoid stressful situations. Live a simple life and avoid aggravations. Organize your time and prepare for the next day in anticipation. 6. Sleep well, avoid any interruptions of sleep and avoid any distractions in the bedroom that may interfere with adequate sleep quality 7. Avoid sugar, avoid sweets as there is a strong link between excessive sugar intake,  diabetes, and cognitive impairment We discussed the Mediterranean diet, which has been shown to help patients reduce the risk of progressive memory disorders and reduces cardiovascular risk. This includes eating fish, eat fruits and green leafy vegetables, nuts like almonds and hazelnuts, walnuts, and also use olive oil. Avoid fast foods and fried foods as much as possible. Avoid sweets and sugar as sugar use has been linked to worsening of memory function.  There is always a concern of gradual progression of memory problems. If this is the case, then we may need to adjust level of care according to patient needs. Support, both to the patient and caregiver, should then be put into place.    The Alzheimer's Association is here all day, every day for people facing Alzheimer's disease through our free 24/7 Helpline: (513)340-5004. The Helpline provides reliable information and support to all those who need assistance, such as individuals living with memory loss, Alzheimer's or other dementia, caregivers, health care professionals and the public.  Our highly trained and knowledgeable staff can help you with: Understanding memory loss, dementia and Alzheimer's  Medications and other treatment options  General information about aging and brain health  Skills to provide quality care and to find the best care from professionals  Legal, financial and living-arrangement decisions Our Helpline also features: Confidential care consultation provided by master's level clinicians who can help with decision-making support, crisis assistance and education on issues families face every day  Help in a caller's preferred language using our translation service that features more than 200 languages and  dialects  Referrals to local community programs, services and ongoing support     FALL PRECAUTIONS: Be cautious when walking. Scan the area for obstacles that may increase the risk of trips and falls. When getting up in  the mornings, sit up at the edge of the bed for a few minutes before getting out of bed. Consider elevating the bed at the head end to avoid drop of blood pressure when getting up. Walk always in a well-lit room (use night lights in the walls). Avoid area rugs or power cords from appliances in the middle of the walkways. Use a walker or a cane if necessary and consider physical therapy for balance exercise. Get your eyesight checked regularly.  FINANCIAL OVERSIGHT: Supervision, especially oversight when making financial decisions or transactions is also recommended.  HOME SAFETY: Consider the safety of the kitchen when operating appliances like stoves, microwave oven, and blender. Consider having supervision and share cooking responsibilities until no longer able to participate in those. Accidents with firearms and other hazards in the house should be identified and addressed as well.   ABILITY TO BE LEFT ALONE: If patient is unable to contact 911 operator, consider using LifeLine, or when the need is there, arrange for someone to stay with patients. Smoking is a fire hazard, consider supervision or cessation. Risk of wandering should be assessed by caregiver and if detected at any point, supervision and safe proof recommendations should be instituted.  MEDICATION SUPERVISION: Inability to self-administer medication needs to be constantly addressed. Implement a mechanism to ensure safe administration of the medications.          Mediterranean Diet A Mediterranean diet refers to food and lifestyle choices that are based on the traditions of countries located on the [employer]. This way of eating has been shown to help prevent certain conditions and improve outcomes for people who have chronic diseases, like kidney disease and heart disease. What are tips for following this plan? Lifestyle  Cook and eat meals together with your family, when possible. Drink enough fluid to keep your urine  clear or pale yellow. Be physically active every day. This includes: Aerobic exercise like running or swimming. Leisure activities like gardening, walking, or housework. Get 7-8 hours of sleep each night. If recommended by your health care provider, drink red wine in moderation. This means 1 glass a day for nonpregnant women and 2 glasses a day for men. A glass of wine equals 5 oz (150 mL). Reading food labels  Check the serving size of packaged foods. For foods such as rice and pasta, the serving size refers to the amount of cooked product, not dry. Check the total fat in packaged foods. Avoid foods that have saturated fat or trans fats. Check the ingredients list for added sugars, such as corn syrup. Shopping  At the grocery store, buy most of your food from the areas near the walls of the store. This includes: Fresh fruits and vegetables (produce). Grains, beans, nuts, and seeds. Some of these may be available in unpackaged forms or large amounts (in bulk). Fresh seafood. Poultry and eggs. Low-fat dairy products. Buy whole ingredients instead of prepackaged foods. Buy fresh fruits and vegetables in-season from local farmers markets. Buy frozen fruits and vegetables in resealable bags. If you do not have access to quality fresh seafood, buy precooked frozen shrimp or canned fish, such as tuna, salmon, or sardines. Buy small amounts of raw or cooked vegetables, salads, or olives from the deli or salad  bar at your store. Stock your pantry so you always have certain foods on hand, such as olive oil, canned tuna, canned tomatoes, rice, pasta, and beans. Cooking  Cook foods with extra-virgin olive oil instead of using butter or other vegetable oils. Have meat as a side dish, and have vegetables or grains as your main dish. This means having meat in small portions or adding small amounts of meat to foods like pasta or stew. Use beans or vegetables instead of meat in common dishes like chili or  lasagna. Experiment with different cooking methods. Try roasting or broiling vegetables instead of steaming or sauteing them. Add frozen vegetables to soups, stews, pasta, or rice. Add nuts or seeds for added healthy fat at each meal. You can add these to yogurt, salads, or vegetable dishes. Marinate fish or vegetables using olive oil, lemon juice, garlic, and fresh herbs. Meal planning  Plan to eat 1 vegetarian meal one day each week. Try to work up to 2 vegetarian meals, if possible. Eat seafood 2 or more times a week. Have healthy snacks readily available, such as: Vegetable sticks with hummus. Greek yogurt. Fruit and nut trail mix. Eat balanced meals throughout the week. This includes: Fruit: 2-3 servings a day Vegetables: 4-5 servings a day Low-fat dairy: 2 servings a day Fish, poultry, or lean meat: 1 serving a day Beans and legumes: 2 or more servings a week Nuts and seeds: 1-2 servings a day Whole grains: 6-8 servings a day Extra-virgin olive oil: 3-4 servings a day Limit red meat and sweets to only a few servings a month What are my food choices? Mediterranean diet Recommended Grains: Whole-grain pasta. Brown rice. Bulgar wheat. Polenta. Couscous. Whole-wheat bread. Orpah Cobb. Vegetables: Artichokes. Beets. Broccoli. Cabbage. Carrots. Eggplant. Green beans. Chard. Kale. Spinach. Onions. Leeks. Peas. Squash. Tomatoes. Peppers. Radishes. Fruits: Apples. Apricots. Avocado. Berries. Bananas. Cherries. Dates. Figs. Grapes. Lemons. Melon. Oranges. Peaches. Plums. Pomegranate. Meats and other protein foods: Beans. Almonds. Sunflower seeds. Pine nuts. Peanuts. Cod. Salmon. Scallops. Shrimp. Tuna. Tilapia. Clams. Oysters. Eggs. Dairy: Low-fat milk. Cheese. Greek yogurt. Beverages: Water. Red wine. Herbal tea. Fats and oils: Extra virgin olive oil. Avocado oil. Grape seed oil. Sweets and desserts: [place] yogurt with honey. Baked apples. Poached pears. Trail mix. Seasoning and  other foods: Basil. Cilantro. Coriander. Cumin. Mint. Parsley. Sage. Rosemary. Tarragon. Garlic. Oregano. Thyme. Pepper. Balsalmic vinegar. Tahini. Hummus. Tomato sauce. Olives. Mushrooms. Limit these Grains: Prepackaged pasta or rice dishes. Prepackaged cereal with added sugar. Vegetables: Deep fried potatoes (french fries). Fruits: Fruit canned in syrup. Meats and other protein foods: Beef. Pork. Lamb. Poultry with skin. Hot dogs. Tomasa Blase. Dairy: Ice cream. Sour cream. Whole milk. Beverages: Juice. Sugar-sweetened soft drinks. Beer. Liquor and spirits. Fats and oils: Butter. Canola oil. Vegetable oil. Beef fat (tallow). Lard. Sweets and desserts: Cookies. Cakes. Pies. Candy. Seasoning and other foods: Mayonnaise. Premade sauces and marinades. The items listed may not be a complete list. Talk with your dietitian about what dietary choices are right for you. Summary The Mediterranean diet includes both food and lifestyle choices. Eat a variety of fresh fruits and vegetables, beans, nuts, seeds, and whole grains. Limit the amount of red meat and sweets that you eat. Talk with your health care provider about whether it is safe for you to drink red wine in moderation. This means 1 glass a day for nonpregnant women and 2 glasses a day for men. A glass of wine equals 5 oz (150 mL). This information is not  intended to replace advice given to you by your health care provider. Make sure you discuss any questions you have with your health care provider. Document Released: 05/22/2016 Document Revised: 06/24/2016 Document Reviewed: 05/22/2016 Elsevier Interactive Patient Education  2017 [employer].

## 2023-09-30 NOTE — Progress Notes (Addendum)
Assessment/Plan:   Dementia due to Alzheimer's disease with behavioral disturbance, advanced  Darlene Ballard is a very pleasant 77 y.o. RH female with a history of advanced dementia with behavioral disturbance seen today prior to her scheduled follow-up in March 2025 follow up for due to increased behavioral changes including aggressiveness and agitation. Patient is not on antidementia medications as these are no longer therapeutic.  She lives with her daughter and as recall, she had to be removed for an unsafe environment several months ago.  Her daughter manages most of her ADLs.  She is on Prozac 10 mg daily, and she was unable to tolerate olanzapine.  And prior evaluation on 07/10/2023 we discussed the role of Rexulti, FDA medicine approved for dementia patients who experience agitation, information has been provided to her daughter in an effort to reduce the cost of it.  Her PCP gave her 1 month supply of Rexulti 0.5 mg tabs daily, however this is still unaffordable, she reports that is about $400 a month.  Of note, the most adequate dose of this medication should increase slowly to 2 mg daily to be therapeutic which would increase the cost even more.Discussed other antipsychotic therapies, will initiate  Seroquel as directed in addition to Prozac which can be increased to 20 mg first and up to 40 mg if needed.  As she is experiencing pacing, irritability, hoarding, sometimes verbally aggressive including swearing, screaming, complaining, emotional outbursts, cursing, hitting tendencies and resisting help. Daughter agreed to proceed. There is a social/ legal situation undergoing  for which there is a hearing sometime in January for guardianship.    Follow up in  1 month   She is on a low dose of Prozac, would increase to 20mg  first (can go up to 40mg  in the future if needed).  Will start Seroquel 25mg  1/2 tab in evening, increase to 1 tab at bedtime after a week.  Continue 24/7 monitoring for  safety Agree with memory care placement at some point , in the interim she is attending adult day program on a daily basis for safety and social stimulation Recommend good control of her cardiovascular risk factors Continue to control mood as per PCP     Subjective:    This patient is accompanied in the office by her daughter who supplements the history.  Previous records as well as any outside records available were reviewed prior to todays visit. Patient was last seen on 07/10/2023.  Last MoCA on 03/10/2023 was 4/30    Any changes in memory since last visit? "A little worse.  She attends a day program since August 2024.  She likes to play bingo and taps to the music.  repeats oneself?  Endorsed Disoriented when walking into a room?  She has moments of disorientation when waking up   Leaving objects?  May misplace things but not in unusual places, but has hoarding tendencies   Wandering behavior?  Her daughter placed a security system for surveillance Any personality changes since last visit?  Endorsed, she has moments of  irritability, hoarding, sometimes verbally aggressive including swearing, screaming, complaining, emotional outbursts, cursing, hitting tendencies and resisting help. Any worsening depression?:  Denies.   Hallucinations or paranoia?  Endorsed, sometimes she thinks that there is a person or an animal in the house when he is not.   Seizures? denies    Any sleep changes?  "Not this week". Denies vivid dreams, REM behavior or sleepwalking   Sleep apnea?   Denies.  Any hygiene concerns?  Her daughter has to assist her, washing her hair and bathing her. Independent of bathing and dressing?  She needs assistance, otherwise she will place one garment on top of the other. Does the patient needs help with medications?  Daughter is in charge   Who is in charge of the finances?  Daughter is in charge     Any changes in appetite? 'Depends on her, some days great some day not much"      Patient have trouble swallowing? Denies.   Does the patient cook? No Any headaches?   denies   Chronic back pain  denies   Ambulates with difficulty? Denies  Recent falls or head injuries? Denies.   Unilateral weakness, numbness or tingling? denies   Any tremors?  "Due to anxiety ", she has lifelong tremors Any anosmia?  Denies   Any incontinence of urine?  Endorsed, she does not remember where the bathroom is, wears diapers Any bowel dysfunction?   Denies      Patient lives with her daughter. Palliative has ben called but eventually  will be memory care  Does the patient drive? No longer drives      PREVIOUS MEDICATIONS:   CURRENT MEDICATIONS:  Outpatient Encounter Medications as of 09/30/2023  Medication Sig   FLUoxetine (PROZAC) 10 MG capsule TAKE 1 CAPSULE (10 MG TOTAL) BY MOUTH DAILY.   levothyroxine (SYNTHROID) 75 MCG tablet Take 75 mcg by mouth once. Take one tablet once a day.   Loperamide HCl (IMODIUM PO) Take by mouth.   QUEtiapine (SEROQUEL) 25 MG tablet Take half tablet at night, may increase to a full tablet after 1 week   [DISCONTINUED] acetaminophen (TYLENOL) 325 MG tablet Take 2 tablets (650 mg total) by mouth every 6 (six) hours as needed for mild pain (pain score 1-3) (or Fever >/= 101). (Patient not taking: Reported on 09/30/2023)   [DISCONTINUED] Brexpiprazole (REXULTI) 0.5 MG TABS Take 1 tablet (0.5 mg total) by mouth daily. (Patient not taking: Reported on 09/30/2023)   [DISCONTINUED] levothyroxine (SYNTHROID) 75 MCG tablet Take 1 tablet (75 mcg total) by mouth daily. (Patient not taking: Reported on 09/30/2023)   [DISCONTINUED] melatonin 5 MG TABS Take 1 tablet (5 mg total) by mouth at bedtime. (Patient not taking: Reported on 09/30/2023)   [DISCONTINUED] Multiple Vitamin (MULTIVITAMIN WITH MINERALS) TABS tablet Take 1 tablet by mouth daily. (Patient not taking: Reported on 09/30/2023)   [DISCONTINUED] saccharomyces boulardii (FLORASTOR) 250 MG capsule Take 1  capsule (250 mg total) by mouth 2 (two) times daily. (Patient not taking: Reported on 09/30/2023)   No facility-administered encounter medications on file as of 09/30/2023.       03/21/2020    3:00 PM 01/03/2020    7:19 AM 08/09/2019    3:15 PM  MMSE - Mini Mental State Exam  Orientation to time 2 0 3  Orientation to Place 3 3 4   Registration 3 3 3   Attention/ Calculation 1 1 1   Recall 0 0 0  Language- name 2 objects 2 2 2   Language- repeat 1 1 1   Language- follow 3 step command 3 3 3   Language- read & follow direction 1 1 1   Write a sentence 1 1 1   Copy design 1 1 1   Total score 18 16 20       12/11/2020    8:52 AM  Montreal Cognitive Assessment   Visuospatial/ Executive (0/5) 0  Naming (0/3) 1  Attention: Read list of digits (0/2) 1  Attention: Read list of letters (0/1) 0  Attention: Serial 7 subtraction starting at 100 (0/3) 1  Language: Repeat phrase (0/2) 0  Language : Fluency (0/1) 0  Abstraction (0/2) 1  Delayed Recall (0/5) 0  Orientation (0/6) 0  Total 4  Adjusted Score (based on education) 4    Objective:     PHYSICAL EXAMINATION:    VITALS:   Vitals:   09/30/23 1306  BP: 121/74  Pulse: 73  SpO2: 98%  Weight: 115 lb (52.2 kg)  Height: 5\' 2"  (1.575 m)    GEN:  The patient appears stated age and is in NAD. HEENT:  Normocephalic, atraumatic.   Neurological examination:  General: NAD, well-groomed, appears stated age. Orientation: The patient is alert. Oriented to person, not to place and date Cranial nerves: There is good facial symmetry.The speech is fluent but tangential, clear. No aphasia or dysarthria. Fund of knowledge is reduced. Recent and remote memory are impaired. Attention and concentration are reduced.  Unable to name objects and repeat phrases.  Hearing is intact to conversational tone.   Sensation: Sensation is intact to light touch throughout Motor: Strength is at least antigravity x4. DTR's 2/4 in UE/LE     Movement  examination: Tone: There is normal tone in the UE/LE Abnormal movements:  no tremor.  No myoclonus.  No asterixis.   Coordination:  There is decremation with RAM's. Normal finger to nose  Gait and Station: The patient has some difficulty arising out of a deep-seated chair without the use of the hands due to knee arthritis. The patient's stride length is good.  Gait is cautious and narrow.      Thank you for allowing [place] the opportunity to participate in the care of this nice patient. Please do not hesitate to contact us for any questions or concerns.   Total time spent on today's visit was 46 minutes dedicated to this patient today, preparing to see patient, examining the patient, ordering tests and/or medications and counseling the patient, documenting clinical information in the EHR or other health record, independently interpreting results and communicating results to the patient/family, discussing treatment and goals, answering patient's questions and coordinating care.  Cc:  Nemiah Commander 09/30/2023 5:52 PM

## 2023-10-01 NOTE — Addendum Note (Signed)
Addended by: Jac Canavan on: 10/01/2023 08:05 AM   Modules accepted: Level of Service

## 2023-10-06 ENCOUNTER — Other Ambulatory Visit: Payer: PPO

## 2023-10-06 DIAGNOSIS — R195 Other fecal abnormalities: Secondary | ICD-10-CM | POA: Diagnosis not present

## 2023-10-06 DIAGNOSIS — F03911 Unspecified dementia, unspecified severity, with agitation: Secondary | ICD-10-CM | POA: Diagnosis not present

## 2023-10-06 DIAGNOSIS — R300 Dysuria: Secondary | ICD-10-CM

## 2023-10-06 DIAGNOSIS — R262 Difficulty in walking, not elsewhere classified: Secondary | ICD-10-CM

## 2023-10-06 DIAGNOSIS — F919 Conduct disorder, unspecified: Secondary | ICD-10-CM

## 2023-10-06 DIAGNOSIS — Z741 Need for assistance with personal care: Secondary | ICD-10-CM

## 2023-10-06 DIAGNOSIS — I10 Essential (primary) hypertension: Secondary | ICD-10-CM | POA: Diagnosis not present

## 2023-10-06 DIAGNOSIS — E039 Hypothyroidism, unspecified: Secondary | ICD-10-CM | POA: Diagnosis not present

## 2023-10-07 LAB — BASIC METABOLIC PANEL
BUN/Creatinine Ratio: 18 (ref 12–28)
BUN: 11 mg/dL (ref 8–27)
CO2: 25 mmol/L (ref 20–29)
Calcium: 8.8 mg/dL (ref 8.7–10.3)
Chloride: 101 mmol/L (ref 96–106)
Creatinine, Ser: 0.6 mg/dL (ref 0.57–1.00)
Glucose: 72 mg/dL (ref 70–99)
Potassium: 3.8 mmol/L (ref 3.5–5.2)
Sodium: 140 mmol/L (ref 134–144)
eGFR: 94 mL/min/{1.73_m2} (ref >59)

## 2023-10-07 LAB — CBC
Hematocrit: 40.2 % (ref 34.0–46.6)
Hemoglobin: 13.5 g/dL (ref 11.1–15.9)
MCH: 31.6 pg (ref 26.6–33.0)
MCHC: 33.6 g/dL (ref 31.5–35.7)
MCV: 94 fL (ref 79–97)
Platelets: 173 10*3/uL (ref 150–450)
RBC: 4.27 x10E6/uL (ref 3.77–5.28)
RDW: 13 % (ref 11.7–15.4)
WBC: 5 10*3/uL (ref 3.4–10.8)

## 2023-10-07 LAB — MAGNESIUM: Magnesium: 1.9 mg/dL (ref 1.6–2.3)

## 2023-10-08 ENCOUNTER — Telehealth: Payer: Self-pay | Admitting: Medical

## 2023-10-08 DIAGNOSIS — R197 Diarrhea, unspecified: Secondary | ICD-10-CM | POA: Diagnosis not present

## 2023-10-08 DIAGNOSIS — F03911 Unspecified dementia, unspecified severity, with agitation: Secondary | ICD-10-CM | POA: Diagnosis not present

## 2023-10-08 NOTE — Telephone Encounter (Signed)
done

## 2023-10-08 NOTE — Progress Notes (Signed)
Results sent through MyChart

## 2023-10-08 NOTE — Telephone Encounter (Signed)
Will with [facility] t# [phone] called & states they will be assisting with pt's care, said they are not Hospice but more of Home health, they check on pt once a month, and are there for support & comfort

## 2023-10-19 ENCOUNTER — Other Ambulatory Visit: Payer: Self-pay | Admitting: Medical

## 2023-10-19 ENCOUNTER — Telehealth: Payer: Self-pay | Admitting: Medical

## 2023-10-19 DIAGNOSIS — R195 Other fecal abnormalities: Secondary | ICD-10-CM

## 2023-10-19 DIAGNOSIS — R159 Full incontinence of feces: Secondary | ICD-10-CM

## 2023-10-19 NOTE — Telephone Encounter (Signed)
 Daughter called and wants go ahead and do GI referral, she states that you discussed before

## 2023-10-22 ENCOUNTER — Telehealth: Payer: Self-pay | Admitting: Medical

## 2023-10-22 NOTE — Telephone Encounter (Signed)
 Daughter called back and scheduled TB blood test for tomorrow. Needs new FL2 needs by 1/15

## 2023-10-22 NOTE — Telephone Encounter (Signed)
 Jon called and says Darlene Ballard is needing a TB Skin test done for her memory care facility. I did advise that it is done through blood now and she needs this done before the 15th.  Also she needs another FL2 form but it doesn't have to be completed by the 15th.

## 2023-10-23 ENCOUNTER — Other Ambulatory Visit: Payer: PPO

## 2023-10-23 DIAGNOSIS — Z111 Encounter for screening for respiratory tuberculosis: Secondary | ICD-10-CM

## 2023-10-27 LAB — QUANTIFERON-TB GOLD PLUS
QuantiFERON Nil Value: 0.01 [IU]/mL
QuantiFERON TB1 Ag Value: 0.01 [IU]/mL
QuantiFERON TB2 Ag Value: 0.01 [IU]/mL

## 2023-10-27 NOTE — Progress Notes (Signed)
 QuantiFERON gold tuberculosis screen was negative

## 2023-10-30 ENCOUNTER — Ambulatory Visit: Payer: PPO | Admitting: Physician Assistant

## 2023-11-02 DIAGNOSIS — F419 Anxiety disorder, unspecified: Secondary | ICD-10-CM | POA: Diagnosis not present

## 2023-11-02 DIAGNOSIS — G479 Sleep disorder, unspecified: Secondary | ICD-10-CM | POA: Diagnosis not present

## 2023-11-02 DIAGNOSIS — E785 Hyperlipidemia, unspecified: Secondary | ICD-10-CM | POA: Diagnosis not present

## 2023-11-02 DIAGNOSIS — E039 Hypothyroidism, unspecified: Secondary | ICD-10-CM | POA: Diagnosis not present

## 2023-11-02 DIAGNOSIS — R197 Diarrhea, unspecified: Secondary | ICD-10-CM | POA: Diagnosis not present

## 2023-11-02 DIAGNOSIS — F02818 Dementia in other diseases classified elsewhere, unspecified severity, with other behavioral disturbance: Secondary | ICD-10-CM | POA: Diagnosis not present

## 2023-11-04 ENCOUNTER — Other Ambulatory Visit (HOSPITAL_COMMUNITY): Payer: Self-pay

## 2023-11-07 DIAGNOSIS — Z79899 Other long term (current) drug therapy: Secondary | ICD-10-CM | POA: Diagnosis not present

## 2023-11-07 DIAGNOSIS — E034 Atrophy of thyroid (acquired): Secondary | ICD-10-CM | POA: Diagnosis not present

## 2023-11-09 DIAGNOSIS — E034 Atrophy of thyroid (acquired): Secondary | ICD-10-CM | POA: Diagnosis not present

## 2023-11-09 DIAGNOSIS — F02818 Dementia in other diseases classified elsewhere, unspecified severity, with other behavioral disturbance: Secondary | ICD-10-CM | POA: Diagnosis not present

## 2023-11-09 DIAGNOSIS — E876 Hypokalemia: Secondary | ICD-10-CM | POA: Diagnosis not present

## 2023-11-13 ENCOUNTER — Ambulatory Visit: Payer: PPO | Admitting: Physician Assistant

## 2023-11-13 ENCOUNTER — Encounter: Payer: Self-pay | Admitting: Physician Assistant

## 2023-11-13 VITALS — BP 127/76 | HR 84 | Ht 63.0 in | Wt 119.6 lb

## 2023-11-13 DIAGNOSIS — F02818 Dementia in other diseases classified elsewhere, unspecified severity, with other behavioral disturbance: Secondary | ICD-10-CM

## 2023-11-13 DIAGNOSIS — G301 Alzheimer's disease with late onset: Secondary | ICD-10-CM

## 2023-11-13 MED ORDER — QUETIAPINE FUMARATE 25 MG PO TABS: ORAL_TABLET | ORAL | 3 refills | Status: DC

## 2023-11-13 NOTE — Patient Instructions (Signed)
It was a pleasure to see you today at our office.   Recommendations:  Follow up in May 16 at 11:30  Agree with  24/7 care   Continue Prozac  as directed, consider increasing to 20 mg daily, may go up to 40 mg in the future if needed Continue Seroquel 25 mg nightly, may increase to 1 and half-2 tabs   Whom to call:  Memory  decline, memory medications: Call our office [phone]   For psychiatric meds, mood meds: Please have your primary care physician manage these medications.      For assessment of decision of mental capacity and competency:  Call Dr. Erick Blinks, geriatric psychiatrist at [phone]  For guidance in geriatric dementia issues please call [facility] [phone]  Call [facility] [email or web address]    If you have any severe symptoms of a stroke, or other severe issues such as confusion,severe chills or fever, etc call 911 or go to the ER as you may need to be evaluated further     RECOMMENDATIONS FOR ALL PATIENTS WITH MEMORY PROBLEMS: 1. Continue to exercise (Recommend 30 minutes of walking everyday, or 3 hours every week) 2. Increase social interactions - continue going to [place] and enjoy social gatherings with friends and family 3. Eat healthy, avoid fried foods and eat more fruits and vegetables 4. Maintain adequate blood pressure, blood sugar, and blood cholesterol level. Reducing the risk of stroke and cardiovascular disease also helps promoting better memory. 5. Avoid stressful situations. Live a simple life and avoid aggravations. Organize your time and prepare for the next day in anticipation. 6. Sleep well, avoid any interruptions of sleep and avoid any distractions in the bedroom that may interfere with adequate sleep quality 7. Avoid sugar, avoid sweets as there is a strong link between excessive sugar intake, diabetes, and cognitive impairment We discussed the Mediterranean diet, which has been  shown to help patients reduce the risk of progressive memory disorders and reduces cardiovascular risk. This includes eating fish, eat fruits and green leafy vegetables, nuts like almonds and hazelnuts, walnuts, and also use olive oil. Avoid fast foods and fried foods as much as possible. Avoid sweets and sugar as sugar use has been linked to worsening of memory function.  There is always a concern of gradual progression of memory problems. If this is the case, then we may need to adjust level of care according to patient needs. Support, both to the patient and caregiver, should then be put into place.    The Alzheimer's Association is here all day, every day for people facing Alzheimer's disease through our free 24/7 Helpline: [phone]. The Helpline provides reliable information and support to all those who need assistance, such as individuals living with memory loss, Alzheimer's or other dementia, caregivers, health care professionals and the public.  Our highly trained and knowledgeable staff can help you with: Understanding memory loss, dementia and Alzheimer's  Medications and other treatment options  General information about aging and brain health  Skills to provide quality care and to find the best care from professionals  Legal, financial and living-arrangement decisions Our Helpline also features: Confidential care consultation provided by master's level clinicians who can help with decision-making support, crisis assistance and education on issues families face every day  Help in a caller's preferred language using our translation service that features more than 200 languages and dialects  Referrals to local community programs, services and ongoing support     FALL PRECAUTIONS: Be cautious  when walking. Scan the area for obstacles that may increase the risk of trips and falls. When getting up in the mornings, sit up at the edge of the bed for a few minutes before getting out of  bed. Consider elevating the bed at the head end to avoid drop of blood pressure when getting up. Walk always in a well-lit room (use night lights in the walls). Avoid area rugs or power cords from appliances in the middle of the walkways. Use a walker or a cane if necessary and consider physical therapy for balance exercise. Get your eyesight checked regularly.  FINANCIAL OVERSIGHT: Supervision, especially oversight when making financial decisions or transactions is also recommended.  HOME SAFETY: Consider the safety of the kitchen when operating appliances like stoves, microwave oven, and blender. Consider having supervision and share cooking responsibilities until no longer able to participate in those. Accidents with firearms and other hazards in the house should be identified and addressed as well.   ABILITY TO BE LEFT ALONE: If patient is unable to contact 911 operator, consider using LifeLine, or when the need is there, arrange for someone to stay with patients. Smoking is a fire hazard, consider supervision or cessation. Risk of wandering should be assessed by caregiver and if detected at any point, supervision and safe proof recommendations should be instituted.  MEDICATION SUPERVISION: Inability to self-administer medication needs to be constantly addressed. Implement a mechanism to ensure safe administration of the medications.          Mediterranean Diet A Mediterranean diet refers to food and lifestyle choices that are based on the traditions of countries located on the [employer]. This way of eating has been shown to help prevent certain conditions and improve outcomes for people who have chronic diseases, like kidney disease and heart disease. What are tips for following this plan? Lifestyle  Cook and eat meals together with your family, when possible. Drink enough fluid to keep your urine clear or pale yellow. Be physically active every day. This includes: Aerobic  exercise like running or swimming. Leisure activities like gardening, walking, or housework. Get 7-8 hours of sleep each night. If recommended by your health care provider, drink red wine in moderation. This means 1 glass a day for nonpregnant women and 2 glasses a day for men. A glass of wine equals 5 oz (150 mL). Reading food labels  Check the serving size of packaged foods. For foods such as rice and pasta, the serving size refers to the amount of cooked product, not dry. Check the total fat in packaged foods. Avoid foods that have saturated fat or trans fats. Check the ingredients list for added sugars, such as corn syrup. Shopping  At the grocery store, buy most of your food from the areas near the walls of the store. This includes: Fresh fruits and vegetables (produce). Grains, beans, nuts, and seeds. Some of these may be available in unpackaged forms or large amounts (in bulk). Fresh seafood. Poultry and eggs. Low-fat dairy products. Buy whole ingredients instead of prepackaged foods. Buy fresh fruits and vegetables in-season from local farmers markets. Buy frozen fruits and vegetables in resealable bags. If you do not have access to quality fresh seafood, buy precooked frozen shrimp or canned fish, such as tuna, salmon, or sardines. Buy small amounts of raw or cooked vegetables, salads, or olives from the deli or salad bar at your store. Stock your pantry so you always have certain foods on hand, such as olive oil,  canned tuna, canned tomatoes, rice, pasta, and beans. Cooking  Cook foods with extra-virgin olive oil instead of using butter or other vegetable oils. Have meat as a side dish, and have vegetables or grains as your main dish. This means having meat in small portions or adding small amounts of meat to foods like pasta or stew. Use beans or vegetables instead of meat in common dishes like chili or lasagna. Experiment with different cooking methods. Try roasting or broiling  vegetables instead of steaming or sauteing them. Add frozen vegetables to soups, stews, pasta, or rice. Add nuts or seeds for added healthy fat at each meal. You can add these to yogurt, salads, or vegetable dishes. Marinate fish or vegetables using olive oil, lemon juice, garlic, and fresh herbs. Meal planning  Plan to eat 1 vegetarian meal one day each week. Try to work up to 2 vegetarian meals, if possible. Eat seafood 2 or more times a week. Have healthy snacks readily available, such as: Vegetable sticks with hummus. Greek yogurt. Fruit and nut trail mix. Eat balanced meals throughout the week. This includes: Fruit: 2-3 servings a day Vegetables: 4-5 servings a day Low-fat dairy: 2 servings a day Fish, poultry, or lean meat: 1 serving a day Beans and legumes: 2 or more servings a week Nuts and seeds: 1-2 servings a day Whole grains: 6-8 servings a day Extra-virgin olive oil: 3-4 servings a day Limit red meat and sweets to only a few servings a month What are my food choices? Mediterranean diet Recommended Grains: Whole-grain pasta. Brown rice. Bulgar wheat. Polenta. Couscous. Whole-wheat bread. Orpah Cobb. Vegetables: Artichokes. Beets. Broccoli. Cabbage. Carrots. Eggplant. Green beans. Chard. Kale. Spinach. Onions. Leeks. Peas. Squash. Tomatoes. Peppers. Radishes. Fruits: Apples. Apricots. Avocado. Berries. Bananas. Cherries. Dates. Figs. Grapes. Lemons. Melon. Oranges. Peaches. Plums. Pomegranate. Meats and other protein foods: Beans. Almonds. Sunflower seeds. Pine nuts. Peanuts. Cod. Salmon. Scallops. Shrimp. Tuna. Tilapia. Clams. Oysters. Eggs. Dairy: Low-fat milk. Cheese. Greek yogurt. Beverages: Water. Red wine. Herbal tea. Fats and oils: Extra virgin olive oil. Avocado oil. Grape seed oil. Sweets and desserts: [place] yogurt with honey. Baked apples. Poached pears. Trail mix. Seasoning and other foods: Basil. Cilantro. Coriander. Cumin. Mint. Parsley. Sage. Rosemary.  Tarragon. Garlic. Oregano. Thyme. Pepper. Balsalmic vinegar. Tahini. Hummus. Tomato sauce. Olives. Mushrooms. Limit these Grains: Prepackaged pasta or rice dishes. Prepackaged cereal with added sugar. Vegetables: Deep fried potatoes (french fries). Fruits: Fruit canned in syrup. Meats and other protein foods: Beef. Pork. Lamb. Poultry with skin. Hot dogs. Tomasa Blase. Dairy: Ice cream. Sour cream. Whole milk. Beverages: Juice. Sugar-sweetened soft drinks. Beer. Liquor and spirits. Fats and oils: Butter. Canola oil. Vegetable oil. Beef fat (tallow). Lard. Sweets and desserts: Cookies. Cakes. Pies. Candy. Seasoning and other foods: Mayonnaise. Premade sauces and marinades. The items listed may not be a complete list. Talk with your dietitian about what dietary choices are right for you. Summary The Mediterranean diet includes both food and lifestyle choices. Eat a variety of fresh fruits and vegetables, beans, nuts, seeds, and whole grains. Limit the amount of red meat and sweets that you eat. Talk with your health care provider about whether it is safe for you to drink red wine in moderation. This means 1 glass a day for nonpregnant women and 2 glasses a day for men. A glass of wine equals 5 oz (150 mL). This information is not intended to replace advice given to you by your health care provider. Make sure you discuss any questions you  have with your health care provider. Document Released: 05/22/2016 Document Revised: 06/24/2016 Document Reviewed: 05/22/2016 Elsevier Interactive Patient Education  2017 [employer].

## 2023-11-13 NOTE — Progress Notes (Signed)
Assessment/Plan:   Dementia due to Alzheimer's disease with behavioral disturbance, advanced   Darlene Ballard is a very pleasant 78 y.o. RH female with a history off advanced dementia with behavioral disturbance seen today for memory loss.Patient is not on antidementia medications as these are no longer therapeutic. For mood she is on Prozac 10 mg daily. She is also on Seroquel 25 mg at night, may not be therapeutic. Discussed with daughter increasing dose up to 50 mg at night, she agrees to proceed. She is now at [facility] since Jan 15 and is adapting slowly to the new environment.    Follow up in  6 months. Recommend good control of her cardiovascular risk factors Continue to control mood as per PCP, continue Prozac (may consider increasing to 20 mg by PCP), and Seroquel 25 mg nightly, may increase to 37.5 to 50 mg at night for comfort.. Continue 24/7 monitoring for safety     Subjective:    This patient is accompanied in the office by her daughter  who supplements the history.  Previous records as well as any outside records available were reviewed prior to todays visit. Patient was last seen on 09/30/2023   Any changes in memory since last visit? "About the same".  She is now at [facility] since January 15, adapting to the new environment, she likes taps to the music, "paces all day". repeats oneself?  Endorsed Disoriented when walking into a room?  She has moments of disorientation when waking up.  Leaving objects?  Denies   Wandering behavior? at [facility] she has24/7 monitoring for safety  Any personality changes since last visit?  She has moments of irritability,  sometimes swearing, screaming and complaining, cursing, hitting tendencies. Any worsening depression?:  Denies.   Hallucinations or paranoia?  That she may think that there is an animal in the room. She sees a little boy and puppies. . Seizures? denies    Any sleep changes?  Does  not sleep well, paces at night.  She is trying to adjust to the new environment.  Sleep apnea?   Denies.   Any hygiene concerns?  Facility is in charge Independent of bathing and dressing?  She needs help otherwise she may be putting 1 garment over the other.   Does the patient needs help with medications? Facility is in charge   Who is in charge of the finances?  Daughter is in charge     Any changes in appetite?  "Some days are better than others "   Likes the food there Patient have trouble swallowing? Denies.   Does the patient cook? No Any headaches?   denies   Chronic back pain  denies   Ambulates with difficulty? Denies.  She paces frequently.  Recent falls or head injuries? denies     Unilateral weakness, numbness or tingling? denies   Any tremors?  She has lifelong tremors due to anxiety. Any anosmia?  Denies   Any incontinence of urine?  Endorsed, wears diapers Any bowel dysfunction?   She has some loose stools, is seeing GI. Patient lives  at [facility]     PREVIOUS MEDICATIONS:   CURRENT MEDICATIONS:  Outpatient Encounter Medications as of 11/13/2023  Medication Sig   FLUoxetine (PROZAC) 10 MG capsule TAKE 1 CAPSULE (10 MG TOTAL) BY MOUTH DAILY.   furosemide (LASIX) 20 MG tablet Take 20 mg by mouth daily.   levothyroxine (SYNTHROID) 88 MCG tablet Take 88 mcg  by mouth daily.   Loperamide HCl (IMODIUM PO) Take by mouth.   melatonin 5 MG TABS Take 5 mg by mouth.   potassium chloride SA (KLOR-CON M) 20 MEQ tablet Take 20 mEq by mouth daily.   QUEtiapine (SEROQUEL) 25 MG tablet Take half tablet at night, may increase to a full tablet after 1 week   [DISCONTINUED] levothyroxine (SYNTHROID) 75 MCG tablet Take 75 mcg by mouth once. Take one tablet once a day.   No facility-administered encounter medications on file as of 11/13/2023.       03/21/2020    3:00 PM 01/03/2020    7:19 AM 08/09/2019    3:15 PM  MMSE - Mini Mental State Exam  Orientation to time 2 0 3   Orientation to Place 3 3 4   Registration 3 3 3   Attention/ Calculation 1 1 1   Recall 0 0 0  Language- name 2 objects 2 2 2   Language- repeat 1 1 1   Language- follow 3 step command 3 3 3   Language- read & follow direction 1 1 1   Write a sentence 1 1 1   Copy design 1 1 1   Total score 18 16 20       12/11/2020    8:52 AM  Montreal Cognitive Assessment   Visuospatial/ Executive (0/5) 0  Naming (0/3) 1  Attention: Read list of digits (0/2) 1  Attention: Read list of letters (0/1) 0  Attention: Serial 7 subtraction starting at 100 (0/3) 1  Language: Repeat phrase (0/2) 0  Language : Fluency (0/1) 0  Abstraction (0/2) 1  Delayed Recall (0/5) 0  Orientation (0/6) 0  Total 4  Adjusted Score (based on education) 4    Objective:     PHYSICAL EXAMINATION:    VITALS:   Vitals:   11/13/23 1119  BP: 127/76  Pulse: 84  SpO2: 98%  Weight: 119 lb 9.6 oz (54.3 kg)  Height: 5\' 3"  (1.6 m)    GEN:  The patient appears stated age and is in NAD. HEENT:  Normocephalic, atraumatic.   Neurological examination:  General: NAD, well-groomed, appears stated age. Orientation: The patient is alert. Oriented to person, not to place and date Cranial nerves: There is good facial symmetry.The speech is fluent and clear, tangential. No aphasia or dysarthria. Fund of knowledge is reduced. Recent and remote memory are impaired. Attention and concentration are reduced.  Unable to name objects and repeat phrases.  Hearing is intact to conversational tone.   Sensation: Sensation is intact to light touch throughout Motor: Strength is at least antigravity x4. DTR's 2/4 in UE/LE     Movement examination: Tone: There is normal tone in the UE/LE Abnormal movements:  no tremor.  No myoclonus.  No asterixis.   Coordination:  There is decremation with RAM's. Normal finger to nose  Gait and Station: The patient has no  difficulty arising out of a deep-seated chair without the use of the hands. The patient's  stride length is good.  Gait is cautious and narrow.    Thank you for allowing [place] the opportunity to participate in the care of this nice patient. Please do not hesitate to contact us for any questions or concerns.   Total time spent on today's visit was 30 minutes dedicated to this patient today, preparing to see patient, examining the patient, ordering tests and/or medications and counseling the patient, documenting clinical information in the EHR or other health record, independently interpreting results and communicating results to the patient/family, discussing  treatment and goals, answering patient's questions and coordinating care.  Cc:  Kurth-Bowen, Cornelia, PA-C  Marlowe Kays 11/13/2023 11:40 AM

## 2023-11-17 ENCOUNTER — Other Ambulatory Visit: Payer: Self-pay | Admitting: Nurse Practitioner

## 2023-11-17 ENCOUNTER — Ambulatory Visit
Admission: RE | Admit: 2023-11-17 | Discharge: 2023-11-17 | Disposition: A | Discharge status: Home / Self Care | Payer: PPO | Source: Ambulatory Visit | Attending: Nurse Practitioner | Admitting: Nurse Practitioner

## 2023-11-17 DIAGNOSIS — R197 Diarrhea, unspecified: Secondary | ICD-10-CM | POA: Diagnosis not present

## 2023-11-17 DIAGNOSIS — R109 Unspecified abdominal pain: Secondary | ICD-10-CM | POA: Diagnosis not present

## 2023-11-17 DIAGNOSIS — F02B3 Dementia in other diseases classified elsewhere, moderate, with mood disturbance: Secondary | ICD-10-CM | POA: Diagnosis not present

## 2023-11-17 DIAGNOSIS — G301 Alzheimer's disease with late onset: Secondary | ICD-10-CM | POA: Diagnosis not present

## 2023-11-17 DIAGNOSIS — F411 Generalized anxiety disorder: Secondary | ICD-10-CM | POA: Diagnosis not present

## 2023-11-19 DIAGNOSIS — R197 Diarrhea, unspecified: Secondary | ICD-10-CM | POA: Diagnosis not present

## 2023-12-22 DIAGNOSIS — F411 Generalized anxiety disorder: Secondary | ICD-10-CM | POA: Diagnosis not present

## 2023-12-22 DIAGNOSIS — F02B3 Dementia in other diseases classified elsewhere, moderate, with mood disturbance: Secondary | ICD-10-CM | POA: Diagnosis not present

## 2023-12-22 DIAGNOSIS — G301 Alzheimer's disease with late onset: Secondary | ICD-10-CM | POA: Diagnosis not present

## 2023-12-25 DIAGNOSIS — E034 Atrophy of thyroid (acquired): Secondary | ICD-10-CM | POA: Diagnosis not present

## 2024-01-01 ENCOUNTER — Ambulatory Visit: Payer: PPO | Admitting: Physician Assistant

## 2024-01-07 DIAGNOSIS — Z79899 Other long term (current) drug therapy: Secondary | ICD-10-CM | POA: Diagnosis not present

## 2024-01-19 DIAGNOSIS — F411 Generalized anxiety disorder: Secondary | ICD-10-CM | POA: Diagnosis not present

## 2024-01-19 DIAGNOSIS — F02B3 Dementia in other diseases classified elsewhere, moderate, with mood disturbance: Secondary | ICD-10-CM | POA: Diagnosis not present

## 2024-01-19 DIAGNOSIS — G301 Alzheimer's disease with late onset: Secondary | ICD-10-CM | POA: Diagnosis not present

## 2024-01-21 DIAGNOSIS — N390 Urinary tract infection, site not specified: Secondary | ICD-10-CM | POA: Diagnosis not present

## 2024-01-21 DIAGNOSIS — Z87440 Personal history of urinary (tract) infections: Secondary | ICD-10-CM | POA: Diagnosis not present

## 2024-01-21 DIAGNOSIS — Z79899 Other long term (current) drug therapy: Secondary | ICD-10-CM | POA: Diagnosis not present

## 2024-01-25 DIAGNOSIS — N950 Postmenopausal bleeding: Secondary | ICD-10-CM | POA: Diagnosis not present

## 2024-01-25 DIAGNOSIS — F02818 Dementia in other diseases classified elsewhere, unspecified severity, with other behavioral disturbance: Secondary | ICD-10-CM | POA: Diagnosis not present

## 2024-01-25 DIAGNOSIS — N3000 Acute cystitis without hematuria: Secondary | ICD-10-CM | POA: Diagnosis not present

## 2024-01-25 DIAGNOSIS — E034 Atrophy of thyroid (acquired): Secondary | ICD-10-CM | POA: Diagnosis not present

## 2024-02-10 ENCOUNTER — Telehealth: Payer: Self-pay | Admitting: Urology

## 2024-02-10 ENCOUNTER — Other Ambulatory Visit: Payer: Self-pay

## 2024-02-10 DIAGNOSIS — R399 Unspecified symptoms and signs involving the genitourinary system: Secondary | ICD-10-CM

## 2024-02-10 NOTE — Telephone Encounter (Signed)
-----   Message from Oda Bence sent at 02/10/2024  9:58 AM EDT ----- Regarding: RE: UA/UTI That will be fine. She has not been seen here since 9/24.  We have not been treating her for UTI's since that time.   She will also need to schedule a f/u visit. ----- Message ----- From: Adriane Albe Sent: 02/10/2024   9:51 AM EDT To: [name], MD Subject: UA/UTI                                         This is a patient of Dr. Quentin Brunner, she has had an ongoing UTI and Macrobid has not helped. She just finished a round of it on the 21st and still has a lot of burning when she urinates. Is it possible for her to drop off a UA? She has been living in an assistant living for the past few months and will be leaving there tomorrow.  Thanks, Darlene Ballard

## 2024-02-10 NOTE — Telephone Encounter (Signed)
 Patient's daughter will bring the sample in on 02/11/24.

## 2024-02-11 ENCOUNTER — Other Ambulatory Visit: Payer: Self-pay

## 2024-02-11 ENCOUNTER — Other Ambulatory Visit

## 2024-02-11 DIAGNOSIS — R399 Unspecified symptoms and signs involving the genitourinary system: Secondary | ICD-10-CM | POA: Diagnosis not present

## 2024-02-11 LAB — URINALYSIS, ROUTINE W REFLEX MICROSCOPIC
Bilirubin, UA: NEGATIVE
Glucose, UA: NEGATIVE
Ketones, UA: NEGATIVE
Nitrite, UA: NEGATIVE
RBC, UA: NEGATIVE
Specific Gravity, UA: 1.015 (ref 1.005–1.030)
Urobilinogen, Ur: 0.2 mg/dL (ref 0.2–1.0)
pH, UA: 6 (ref 5.0–7.5)

## 2024-02-11 LAB — MICROSCOPIC EXAMINATION: WBC, UA: >30 /HPF — AB (ref 0–5)

## 2024-02-11 MED ORDER — CIPROFLOXACIN HCL 500 MG PO TABS: 500.0000 mg | ORAL_TABLET | Freq: Two times a day (BID) | ORAL | 0 refills | Status: AC

## 2024-02-13 LAB — URINE CULTURE

## 2024-02-18 ENCOUNTER — Ambulatory Visit (INDEPENDENT_AMBULATORY_CARE_PROVIDER_SITE_OTHER): Admitting: Medical

## 2024-02-18 VITALS — BP 110/72 | HR 60 | Wt 114.2 lb

## 2024-02-18 DIAGNOSIS — F919 Conduct disorder, unspecified: Secondary | ICD-10-CM

## 2024-02-18 DIAGNOSIS — G4700 Insomnia, unspecified: Secondary | ICD-10-CM

## 2024-02-18 DIAGNOSIS — F03918 Unspecified dementia, unspecified severity, with other behavioral disturbance: Secondary | ICD-10-CM | POA: Diagnosis not present

## 2024-02-18 DIAGNOSIS — F419 Anxiety disorder, unspecified: Secondary | ICD-10-CM | POA: Diagnosis not present

## 2024-02-18 DIAGNOSIS — E039 Hypothyroidism, unspecified: Secondary | ICD-10-CM

## 2024-02-18 DIAGNOSIS — R609 Edema, unspecified: Secondary | ICD-10-CM

## 2024-02-18 MED ORDER — FLUOXETINE HCL 20 MG PO CAPS: 20.0000 mg | ORAL_CAPSULE | Freq: Every day | ORAL | 1 refills | Status: AC

## 2024-02-18 MED ORDER — QUETIAPINE FUMARATE 25 MG PO TABS: ORAL_TABLET | ORAL | 1 refills | Status: DC
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
## 2024-02-19 ENCOUNTER — Other Ambulatory Visit: Payer: Self-pay | Admitting: Medical

## 2024-02-19 ENCOUNTER — Other Ambulatory Visit: Payer: Self-pay

## 2024-02-19 ENCOUNTER — Emergency Department (HOSPITAL_COMMUNITY)

## 2024-02-19 ENCOUNTER — Observation Stay (HOSPITAL_COMMUNITY)
Admission: EM | Admit: 2024-02-19 | Discharge: 2024-02-21 | Disposition: A | Discharge status: Home / Self Care | Attending: Family Medicine | Admitting: Family Medicine

## 2024-02-19 ENCOUNTER — Encounter (HOSPITAL_COMMUNITY): Payer: Self-pay

## 2024-02-19 DIAGNOSIS — E876 Hypokalemia: Secondary | ICD-10-CM | POA: Diagnosis not present

## 2024-02-19 DIAGNOSIS — G309 Alzheimer's disease, unspecified: Secondary | ICD-10-CM | POA: Diagnosis not present

## 2024-02-19 DIAGNOSIS — D259 Leiomyoma of uterus, unspecified: Secondary | ICD-10-CM | POA: Insufficient documentation

## 2024-02-19 DIAGNOSIS — R109 Unspecified abdominal pain: Secondary | ICD-10-CM | POA: Diagnosis present

## 2024-02-19 DIAGNOSIS — K529 Noninfective gastroenteritis and colitis, unspecified: Secondary | ICD-10-CM | POA: Diagnosis not present

## 2024-02-19 DIAGNOSIS — Z79899 Other long term (current) drug therapy: Secondary | ICD-10-CM | POA: Insufficient documentation

## 2024-02-19 DIAGNOSIS — E039 Hypothyroidism, unspecified: Secondary | ICD-10-CM | POA: Diagnosis not present

## 2024-02-19 DIAGNOSIS — K449 Diaphragmatic hernia without obstruction or gangrene: Secondary | ICD-10-CM | POA: Diagnosis not present

## 2024-02-19 DIAGNOSIS — F03918 Unspecified dementia, unspecified severity, with other behavioral disturbance: Secondary | ICD-10-CM | POA: Diagnosis present

## 2024-02-19 DIAGNOSIS — K5289 Other specified noninfective gastroenteritis and colitis: Secondary | ICD-10-CM | POA: Diagnosis not present

## 2024-02-19 DIAGNOSIS — R1032 Left lower quadrant pain: Secondary | ICD-10-CM | POA: Diagnosis not present

## 2024-02-19 DIAGNOSIS — F0280 Dementia in other diseases classified elsewhere without behavioral disturbance: Secondary | ICD-10-CM | POA: Diagnosis not present

## 2024-02-19 DIAGNOSIS — R1031 Right lower quadrant pain: Secondary | ICD-10-CM | POA: Diagnosis not present

## 2024-02-19 DIAGNOSIS — K5900 Constipation, unspecified: Secondary | ICD-10-CM | POA: Diagnosis not present

## 2024-02-19 DIAGNOSIS — K5641 Fecal impaction: Principal | ICD-10-CM

## 2024-02-19 DIAGNOSIS — N390 Urinary tract infection, site not specified: Secondary | ICD-10-CM | POA: Insufficient documentation

## 2024-02-19 LAB — COMPREHENSIVE METABOLIC PANEL WITH GFR
ALT: 13 U/L (ref 0–44)
AST: 16 U/L (ref 15–41)
Albumin: 3.6 g/dL (ref 3.5–5.0)
Alkaline Phosphatase: 68 U/L (ref 38–126)
Anion gap: 10 (ref 5–15)
BUN: 17 mg/dL (ref 8–23)
CO2: 23 mmol/L (ref 22–32)
Calcium: 8.7 mg/dL — ABNORMAL LOW (ref 8.9–10.3)
Chloride: 106 mmol/L (ref 98–111)
Creatinine, Ser: 0.77 mg/dL (ref 0.44–1.00)
GFR, Estimated: >60 mL/min (ref >60)
Glucose, Bld: 96 mg/dL (ref 70–99)
Potassium: 3.4 mmol/L — ABNORMAL LOW (ref 3.5–5.1)
Sodium: 139 mmol/L (ref 135–145)
Total Bilirubin: 0.5 mg/dL (ref 0.0–1.2)
Total Protein: 6.1 g/dL — ABNORMAL LOW (ref 6.5–8.1)

## 2024-02-19 LAB — CBC WITH DIFFERENTIAL/PLATELET
Abs Immature Granulocytes: 0.01 10*3/uL (ref 0.00–0.07)
Basophils Absolute: 0.1 10*3/uL (ref 0.0–0.1)
Basophils Relative: 1 %
Eosinophils Absolute: 0.1 10*3/uL (ref 0.0–0.5)
Eosinophils Relative: 1 %
HCT: 38.1 % (ref 36.0–46.0)
Hemoglobin: 12.9 g/dL (ref 12.0–15.0)
Immature Granulocytes: 0 %
Lymphocytes Relative: 20 %
Lymphs Abs: 1.3 10*3/uL (ref 0.7–4.0)
MCH: 31.5 pg (ref 26.0–34.0)
MCHC: 33.9 g/dL (ref 30.0–36.0)
MCV: 93.2 fL (ref 80.0–100.0)
Monocytes Absolute: 0.5 10*3/uL (ref 0.1–1.0)
Monocytes Relative: 7 %
Neutro Abs: 4.8 10*3/uL (ref 1.7–7.7)
Neutrophils Relative %: 71 %
Platelets: 172 10*3/uL (ref 150–400)
RBC: 4.09 MIL/uL (ref 3.87–5.11)
RDW: 13.5 % (ref 11.5–15.5)
WBC: 6.7 10*3/uL (ref 4.0–10.5)
nRBC: 0 % (ref 0.0–0.2)

## 2024-02-19 LAB — TSH+FREE T4
Free T4: 1.17 ng/dL (ref 0.82–1.77)
TSH: 2.53 u[IU]/mL (ref 0.450–4.500)

## 2024-02-19 LAB — LIPASE, BLOOD: Lipase: 39 U/L (ref 11–51)

## 2024-02-19 LAB — POC OCCULT BLOOD, ED: Fecal Occult Bld: NEGATIVE

## 2024-02-19 MED ORDER — LACTATED RINGERS IV SOLN: INTRAVENOUS | Status: DC

## 2024-02-19 MED ORDER — IOHEXOL 350 MG/ML SOLN
75.0000 mL | Freq: Once | INTRAVENOUS | Status: AC | PRN
Start: 2024-02-19 — End: 2024-02-19
  Administered 2024-02-19: 75 mL via INTRAVENOUS

## 2024-02-19 MED ORDER — POTASSIUM CHLORIDE 10 MEQ/100ML IV SOLN
10.0000 meq | INTRAVENOUS | Status: AC
  Administered 2024-02-20 (×2): 10 meq via INTRAVENOUS
  Filled 2024-02-19 (×2): qty 100

## 2024-02-19 NOTE — ED Provider Notes (Signed)
 [place] EMERGENCY DEPARTMENT AT [facility] Provider Note   CSN: [id number] Arrival date & time: 02/19/24  1606     History  Chief Complaint  Patient presents with   Constipation    Darlene Ballard is a 78 y.o. female.   Constipation Associated symptoms: abdominal pain   Patient is a 78 year old female presents the ED today with her daughter with complaints of constipation and lower abdominal pain x 1 week.  Previous medical history of dementia, hypothyroidism, anxiety.  Treated for UTI with Keflex  last week.  Notes that she has been having stool streaking on toilet paper but has not been able to have a bowel movement in 6 days.  Endorses lower abdominal pain and rectal pain as well as persistent dysuria.  Daughter reports that she has still been ambulatory and eating and drinking appropriately and is currently at home.  Denies fever, headache, vision changes, chest pain, shortness of breath, nausea, vomiting, diarrhea, hematochezia, melena, hematuria, lower leg swelling.     Home Medications Prior to Admission medications   Medication Sig Start Date End Date Taking? Authorizing Provider  colestipol (COLESTID) 1 g tablet Take 1 g by mouth 2 (two) times daily. 02/17/24  Yes [provider]  FLUoxetine  (PROZAC ) 20 MG capsule Take 1 capsule (20 mg total) by mouth daily. 02/18/24  Yes Tysinger, Christiane Cowing, PA-C  levothyroxine  (SYNTHROID ) 88 MCG tablet Take 88 mcg by mouth daily. 11/10/23  Yes [provider]  MELATONIN GUMMIES PO Take 10 mg by mouth.   Yes [provider]  QUEtiapine  (SEROQUEL ) 25 MG tablet Take one and half tablet at night (37.5 mg) , may increase  to 50 mg nightly if needed 02/18/24  Yes Tysinger, Christiane Cowing, PA-C      Allergies    Yellow jacket venom, Paxil [paroxetine], and Zoloft [sertraline]    Review of Systems   Review of Systems  Gastrointestinal:  Positive for abdominal pain and constipation.  All other systems reviewed and are  negative.   Physical Exam Updated Vital Signs BP (!) 157/81   Pulse (!) 59   Temp (!) 97.4 F (36.3 C) (Oral)   Resp 13   Ht 5\' 3"  (1.6 m)   Wt 51.8 kg   SpO2 100%   BMI 20.23 kg/m  Physical Exam Vitals and nursing note reviewed.  Constitutional:      General: She is not in acute distress.    Appearance: Normal appearance. She is not ill-appearing.  HENT:     Head: Normocephalic and atraumatic.  Eyes:     General:        Right eye: No discharge.        Left eye: No discharge.     Extraocular Movements: Extraocular movements intact.     Conjunctiva/sclera: Conjunctivae normal.  Cardiovascular:     Rate and Rhythm: Normal rate and regular rhythm.     Pulses: Normal pulses.     Heart sounds: Normal heart sounds. No murmur heard.    No friction rub. No gallop.  Pulmonary:     Effort: Pulmonary effort is normal. No respiratory distress.     Breath sounds: Normal breath sounds. No stridor. No wheezing, rhonchi or rales.  Abdominal:     General: Abdomen is flat. There is no distension.     Palpations: Abdomen is soft.     Tenderness: There is abdominal tenderness (Left lower quadrant and right lower quadrant abdominal tenderness noted to palpation). There is no  right CVA tenderness, left CVA tenderness or guarding.  Musculoskeletal:     Cervical back: No rigidity.     Right lower leg: No edema.     Left lower leg: No edema.  Skin:    General: Skin is warm and dry.     Findings: No bruising or erythema.  Neurological:     General: No focal deficit present.     Mental Status: She is alert. Mental status is at baseline.     Sensory: No sensory deficit.     Motor: No weakness.  Psychiatric:        Mood and Affect: Mood normal.     ED Results / Procedures / Treatments   Labs (all labs ordered are listed, but only abnormal results are displayed) Labs Reviewed  COMPREHENSIVE METABOLIC PANEL WITH GFR - Abnormal; Notable for the following components:      Result Value    Potassium 3.4 (*)    Calcium 8.7 (*)    Total Protein 6.1 (*)    All other components within normal limits  CBC WITH DIFFERENTIAL/PLATELET  LIPASE, BLOOD  URINALYSIS, ROUTINE W REFLEX MICROSCOPIC  POC OCCULT BLOOD, ED    EKG None  Radiology CT ABDOMEN PELVIS W CONTRAST Result Date: 02/19/2024 CLINICAL DATA:  Left lower quadrant pain. Right lower quadrant pain. EXAM: CT ABDOMEN AND PELVIS WITH CONTRAST TECHNIQUE: Multidetector CT imaging of the abdomen and pelvis was performed using the standard protocol following bolus administration of intravenous contrast. RADIATION DOSE REDUCTION: This exam was performed according to the departmental dose-optimization program which includes automated exposure control, adjustment of the mA and/or kV according to patient size and/or use of iterative reconstruction technique. CONTRAST:  75mL OMNIPAQUE  IOHEXOL  350 MG/ML SOLN COMPARISON:  CT 09/13/2023, additional priors reviewed. FINDINGS: Lower chest: Subsegmental atelectasis or scarring in the lung bases. No acute airspace disease. Moderate hiatal hernia. Hepatobiliary: No focal liver abnormality. Punctate hepatic granuloma. Unremarkable gallbladder. No calcified gallstone or pericholecystic inflammation. No biliary dilatation. Pancreas: No ductal dilatation or inflammation. Spleen: Normal in size without focal abnormality. Adrenals/Urinary Tract: No adrenal nodule. No hydronephrosis, renal calculi or suspicious renal lesion. Normal urinary bladder for degree of distension. Stomach/Bowel: Small to moderate hiatal hernia again seen. There is no small bowel distension or evidence of obstruction. Large volume of stool throughout the colon. There is a large stool ball distending the rectum. The rectum spans 10.2 cm. Rectal wall thickening with mild perirectal edema. No pneumatosis. Vascular/Lymphatic: Aortic atherosclerosis and tortuosity. No aneurysm. No abdominopelvic adenopathy. Reproductive: Calcified uterine  fibroids. Simple appearing right adnexal cyst measuring 2.4 cm is unchanged over prior exams. Other: For anterior diaphragmatic hernia in the right lower hemithorax contains a small amount of fat and free fluid. This is slightly increased in size from prior exam. No abdominal ascites. No free air. Musculoskeletal: Stable T12 and L5 superior endplate compression deformity. Advanced lower lumbar facet hypertrophy. IMPRESSION: 1. Large volume of stool throughout the colon with large stool ball distending the rectum. Rectal wall thickening with mild perirectal edema, suspicious for fecal impaction and stercoral colitis. 2. Small to moderate hiatal hernia. 3. Anterior diaphragmatic hernia in the right lower hemithorax contains a small amount of fat and free fluid. This is slightly increased in size from prior exam. 4. Again seen calcified uterine fibroids. Stable 2.4 cm benign-appearing right adnexal cyst. Per consensus guidelines, no follow-up imaging is recommended. Aortic Atherosclerosis (ICD10-I70.0). Electronically Signed   By: Chadwick Colonel M.D.   On: 02/19/2024 21:19  Procedures .Fecal disimpaction  Date/Time: 02/19/2024 11:40 PM  Performed by: Hayes Lipps, PA-C Authorized by: Hayes Lipps, PA-C  Consent: Verbal consent obtained. Consent given by: patient and guardian Patient understanding: patient states understanding of the procedure being performed Patient consent: the patient's understanding of the procedure matches consent given Test results: test results available and properly labeled Imaging studies: imaging studies available Patient identity confirmed: verbally with patient and arm band Preparation: Patient was prepped and draped in the usual sterile fashion. Local anesthesia used: no  Anesthesia: Local anesthesia used: no  Sedation: Patient sedated: no  Patient tolerance: patient tolerated the procedure well with no immediate complications       Medications Ordered  in ED Medications  iohexol  (OMNIPAQUE ) 350 MG/ML injection 75 mL (75 mLs Intravenous Contrast Given 02/19/24 1953)    ED Course/ Medical Decision Making/ A&P Clinical Course as of 02/19/24 2330  Fri Feb 19, 2024  2122 CT ABDOMEN PELVIS W CONTRAST [CB]    Clinical Course User Index [CB] Hayes Lipps, PA-C                                 Medical Decision Making Amount and/or Complexity of Data Reviewed Labs: ordered. Radiology: ordered. Decision-making details documented in ED Course.  Risk Prescription drug management.   This patient is a 78 year old female who presents to the ED for concern of constipation and lower abdominal pain x 1 week.  Previous history of diarrhea treated with Colestid and has follow-up appointment with GI, also has a medical history of dementia, anxiety, hypothyroidism.  Last seen by GI on 11/17/2023 with a follow-up in 3 months.  On physical exam, patient is in no acute distress, afebrile, alert and orient x 4, speaking in full sentences, nontachypneic, nontachycardic.  Noted to have right lower quadrant and left lower quad abdominal pain to palpation.  Patient was obeying instructions and answering questions however required daughter to provide accurate information.  Alert noted x 2 to person and place, baseline.  Unremarkable exam otherwise.  CMP showed mild hypokalemia 3.4 but otherwise unremarkable CBC unremarkable Lipase unremarkable Fecal occult negative UA pending  Manual disimpaction was done and significant stool was removed.  However due to significant stool burden being approximately 10 cm in the rectum and with suspicious stercoral colitis signs, I believe this patient would warrant an admission at this time.  Patient care was transferred over to Dr. Edith Gores.  Differential diagnoses prior to evaluation: The emergent differential diagnosis includes, but is not limited to, fecal impaction, small bowel obstruction, appendicitis,  diverticulitis, colitis, constipation. This is not an exhaustive differential.   Past Medical History / Co-morbidities / Social History: Dementia, hypothyroidism, chronic diarrhea on Colestid.  Additional history: Chart reviewed. Pertinent results include:   Seen by [facility] gastroenterology on 11/17/2023.  Placed on Colestid.  Patient was admitted on 09/13/2023 for for diarrhea and UTI.  Was discharged on Imodium  for diarrhea control as well as probiotics and Questran .  Seen yesterday by PCP.  Last seen by neurology on 11/13/2023 with no changes made to her treatment.  For dementia.  Lab Tests/Imaging studies: I personally interpreted labs/imaging and the pertinent results include: CMP showed mild hypokalemia 3.4 but otherwise unremarkable CBC unremarkable Lipase unremarkable Fecal occult negative UA pending  CT abdomen and pelvis shows large stool volume measuring approximately 10 cm with suspicious rectal wall thickening and perirectal edema for impaction and  stercoral colitis. Small to moderate hiatal hernia noted Anterior diaphragmatic hernia noted in the right lower hemithorax Calcified uterine fibroid stable.  I agree with the radiologist interpretation.    Medications: No medications are provided at this time.  I have reviewed the patients home medicines and have made adjustments as needed.    Social Determinants of Health: Not a facility, staying at home according to daughter, dementia patient  Disposition: After consideration of the diagnostic results and the patients response to treatment, I feel that the patient would benefit from admission, Dr. Lydia Sams, hospitalist, assumed care of patient.  Final Clinical Impression(s) / ED Diagnoses Final diagnoses:  Fecal impaction [facility])  Stercoral colitis    Rx / DC Orders ED Discharge Orders     None         Vevelyn Gowers 02/19/24 2347    Wynetta Heckle, MD 02/20/24 0002

## 2024-02-19 NOTE — Progress Notes (Signed)
 Results sent through MyChart

## 2024-02-19 NOTE — H&P (Signed)
 History and Physical    [name]:[id number] DOB: Mar 18, 1946 DOA: 02/19/2024  PCP: Glynn Lasso, PA-C  Patient coming from: Home  I have personally briefly reviewed patient's old medical records in [facility]  Chief Complaint: Constipation, abdominal pain  HPI: Darlene Ballard is a 78 y.o. female with medical history significant for Alzheimer's dementia, hypothyroidism, anxiety who presented to the ED for evaluation of constipation and abdominal pain.  History is limited from patient due to advanced dementia and is otherwise supplemented by EDP, chart review, and daughter at bedside.  Patient has advanced dementia and lives at home with her daughter.  She does [facility] to [facility] during the day.  She was treated for UTI last week with a course of Keflex .  About 3 months ago she had issues with diarrhea and was placed on Colestid.  Patient has been constipated with no bowel movement over the last week.  She has been having urgency and straining to have bowel movements during this time without success.  Her daughter noted that she had hard stool stuck at her rectal vault.  She otherwise has not had any nausea or vomiting.   ED Course  Labs/Imaging on admission: I have personally reviewed following labs and imaging studies.  Initial vitals showed BP 181/93, pulse 58, RR 15, temp 97.4 F, SpO2 97% on room air.  Labs showed sodium 139, potassium 3.4, bicarb 23, BUN 17, creatinine 0.77, serum glucose 96, LFTs within normal limits, WBC 6.7, hemoglobin 12.9, platelets 172, lipase 39.  FOBT negative.  CT abdomen/pelvis with contrast showed large volume of stool throughout the colon with large stool ball distending the rectum.  Rectal wall thickening with mild perirectal edema suspicious for fecal impaction and stercoral colitis.  Small to moderate hiatal hernia.  Anterior diaphragmatic hernia in the right lower hemithorax contains a small amount of fat and free fluid.   Slightly increased in size from prior exam.  Calcified uterine fibroids again noted.  Per EDP, manual disimpaction was performed with moderate amount of stool out.  The hospitalist service was consulted to admit.  Review of Systems:  Unable to obtain full review of systems due to advanced dementia.   Past Medical History:  Diagnosis Date   Anxiety    Chronic knee pain    Dementia (HCC)    Hypothyroidism    Onychodystrophy    Sleep disturbance     Past Surgical History:  Procedure Laterality Date   TONSILLECTOMY AND ADENOIDECTOMY      Social History: Social History   Tobacco Use   Smoking status: Never   Smokeless tobacco: Never  Vaping Use   Vaping status: Never Used  Substance Use Topics   Alcohol use: Never   Drug use: Never   Allergies  Allergen Reactions   Yellow Jacket Venom Anaphylaxis and Swelling   Paxil [Paroxetine] Other (See Comments)    "Anger issues"   Zoloft [Sertraline] Other (See Comments)    "Anger issues"    Family History  Problem Relation Age of Onset   Alzheimer's disease Mother    Heart disease Mother    Heart attack Mother    Other Father        blood disorder - unsure of condition     Prior to Admission medications   Medication Sig Start Date End Date Taking? Authorizing Provider  colestipol (COLESTID) 1 g tablet Take 1 g by mouth 2 (two) times daily. 02/17/24  Yes [provider]  FLUoxetine  (PROZAC ) 20 MG capsule Take 1 capsule (20 mg total) by mouth daily. 02/18/24  Yes Tysinger, Christiane Cowing, PA-C  levothyroxine  (SYNTHROID ) 88 MCG tablet Take 88 mcg by mouth daily. 11/10/23  Yes [provider]  MELATONIN GUMMIES PO Take 10 mg by mouth.   Yes [provider]  QUEtiapine  (SEROQUEL ) 25 MG tablet Take one and half tablet at night (37.5 mg) , may increase  to 50 mg nightly if needed 02/18/24  Yes Garner Jury    Physical Exam: Vitals:   02/19/24 2015 02/19/24 2045 02/19/24 2320 02/20/24 0020  BP: (!)  132/100 (!) 141/113 (!) 157/81 (!) 163/84  Pulse: (!) 55 (!) 58 (!) 59 (!) 56  Resp: 17 20 13 18   Temp:    98.4 F (36.9 C)  TempSrc:    Oral  SpO2: 100% 98% 100% 100%  Weight:      Height:       Constitutional: Thin elderly woman resting in bed Eyes: EOMI, lids and conjunctivae normal ENMT: Mucous membranes are moist. Posterior pharynx clear of any exudate or lesions.Normal dentition.  Neck: normal, supple, no masses. Respiratory: clear to auscultation bilaterally, no wheezing, no crackles. Normal respiratory effort. No accessory muscle use.  Cardiovascular: Regular rate and rhythm, no murmurs / rubs / gallops. No extremity edema. 2+ pedal pulses. Abdomen: Soft, nondistended, no tenderness, no masses palpated. Musculoskeletal: no clubbing / cyanosis. No joint deformity upper and lower extremities. Good ROM, no contractures. Normal muscle tone.  Skin: no rashes, lesions, ulcers. No induration Neurologic: Sensation intact. Strength 5/5 in all 4.  Psychiatric: Alert and oriented to self.  Pleasantly demented.  EKG: Not performed.  Assessment/Plan Principal Problem:   Stercoral colitis Active Problems:   Dementia with behavioral disturbance (HCC)   Hypothyroidism   Hypokalemia   ZEYNA Ballard is a 78 y.o. female with medical history significant for Alzheimer's dementia, hypothyroidism, anxiety who is admitted with severe constipation and stercoral colitis.  Assessment and Plan: Severe constipation with fecal impaction and stercoral colitis: Last BM about 1 week prior to admission.  Has been on Colestid for previous history of diarrhea.  Fecal disimpaction performed and EDP with reportedly moderate amount of stool out. - Give smog enema once now - Start bowel regimen with scheduled Senokot and MiraLAX tonight - Continue IV fluid hydration - IV ceftriaxone  and Flagyl - Holding Colestid  Mild hypokalemia: Supplementing.  Hypothyroidism: Labs from 5/8 showed normal TSH 2.53  and free T4 1.17.  Continue Synthroid .  Alzheimer's dementia/anxiety/insomnia: Seems to be at baseline advanced dementia.  Continue home Seroquel , Prozac , melatonin.   DVT prophylaxis: enoxaparin  (LOVENOX ) injection 40 mg Start: 02/20/24 2200 Code Status:   Code Status: Do not attempt resuscitation (DNR) PRE-ARREST INTERVENTIONS DESIRED discussed with patient's daughter on admission. Family Communication: Daughter at bedside Disposition Plan: From home and likely return to home pending clinical progress Consults called: None Severity of Illness: The appropriate patient status for this patient is OBSERVATION. Observation status is judged to be reasonable and necessary in order to provide the required intensity of service to ensure the patient's safety. The patient's presenting symptoms, physical exam findings, and initial radiographic and laboratory data in the context of their medical condition is felt to place them at decreased risk for further clinical deterioration. Furthermore, it is anticipated that the patient will be medically stable for discharge from the hospital within 2 midnights of admission.   Edith Gores MD [facility]  If 7PM-7AM, please contact  night-coverage [email or web address]  02/20/2024, 12:52 AM

## 2024-02-19 NOTE — ED Notes (Signed)
 Awaiting patient from lobby.

## 2024-02-19 NOTE — ED Triage Notes (Signed)
 Pt bib daughter due to constipation x 1 week. Daughter said she can see that she is impacted and unable to get it to come out. Pt was at memory care unit on 4/30 and had been given imodium  for loose stools. Daughter states pt has been crying in pain. Has tried OTC miralax but nothing has helped.

## 2024-02-20 DIAGNOSIS — K5289 Other specified noninfective gastroenteritis and colitis: Secondary | ICD-10-CM | POA: Diagnosis not present

## 2024-02-20 LAB — CBC
HCT: 36.9 % (ref 36.0–46.0)
Hemoglobin: 12.4 g/dL (ref 12.0–15.0)
MCH: 31.2 pg (ref 26.0–34.0)
MCHC: 33.6 g/dL (ref 30.0–36.0)
MCV: 92.9 fL (ref 80.0–100.0)
Platelets: 169 10*3/uL (ref 150–400)
RBC: 3.97 MIL/uL (ref 3.87–5.11)
RDW: 13.5 % (ref 11.5–15.5)
WBC: 4.6 10*3/uL (ref 4.0–10.5)
nRBC: 0 % (ref 0.0–0.2)

## 2024-02-20 LAB — BASIC METABOLIC PANEL WITH GFR
Anion gap: 5 (ref 5–15)
BUN: 12 mg/dL (ref 8–23)
CO2: 24 mmol/L (ref 22–32)
Calcium: 8.3 mg/dL — ABNORMAL LOW (ref 8.9–10.3)
Chloride: 107 mmol/L (ref 98–111)
Creatinine, Ser: 0.62 mg/dL (ref 0.44–1.00)
GFR, Estimated: >60 mL/min (ref >60)
Glucose, Bld: 92 mg/dL (ref 70–99)
Potassium: 3.6 mmol/L (ref 3.5–5.1)
Sodium: 136 mmol/L (ref 135–145)

## 2024-02-20 LAB — URINALYSIS, ROUTINE W REFLEX MICROSCOPIC
Bilirubin Urine: NEGATIVE
Glucose, UA: NEGATIVE mg/dL
Hgb urine dipstick: NEGATIVE
Ketones, ur: NEGATIVE mg/dL
Leukocytes,Ua: NEGATIVE
Nitrite: NEGATIVE
Protein, ur: NEGATIVE mg/dL
Specific Gravity, Urine: 1.025 (ref 1.005–1.030)
pH: 7 (ref 5.0–8.0)

## 2024-02-20 MED ORDER — ACETAMINOPHEN 325 MG PO TABS
650.0000 mg | ORAL_TABLET | Freq: Four times a day (QID) | ORAL | Status: DC | PRN
  Administered 2024-02-20 (×3): 650 mg via ORAL
  Filled 2024-02-20 (×4): qty 2

## 2024-02-20 MED ORDER — BISACODYL 10 MG RE SUPP
10.0000 mg | Freq: Every day | RECTAL | Status: DC | PRN
  Administered 2024-02-20: 10 mg via RECTAL
  Filled 2024-02-20: qty 1

## 2024-02-20 MED ORDER — FLUOXETINE HCL 20 MG PO CAPS
20.0000 mg | ORAL_CAPSULE | Freq: Every day | ORAL | Status: DC
  Administered 2024-02-20 – 2024-02-21 (×2): 20 mg via ORAL
  Filled 2024-02-20 (×2): qty 1

## 2024-02-20 MED ORDER — SODIUM CHLORIDE 0.9 % IV SOLN
1.0000 g | INTRAVENOUS | Status: DC
  Administered 2024-02-20: 1 g via INTRAVENOUS
  Filled 2024-02-20: qty 10

## 2024-02-20 MED ORDER — QUETIAPINE 12.5 MG HALF TABLET
37.5000 mg | ORAL_TABLET | Freq: Every day | ORAL | Status: DC
  Administered 2024-02-20 (×2): 37.5 mg via ORAL
  Filled 2024-02-20 (×2): qty 3

## 2024-02-20 MED ORDER — METRONIDAZOLE 500 MG/100ML IV SOLN
500.0000 mg | Freq: Two times a day (BID) | INTRAVENOUS | Status: DC
  Administered 2024-02-20: 500 mg via INTRAVENOUS
  Filled 2024-02-20: qty 100

## 2024-02-20 MED ORDER — ONDANSETRON HCL 4 MG PO TABS: 4.0000 mg | ORAL_TABLET | Freq: Four times a day (QID) | ORAL | Status: DC | PRN

## 2024-02-20 MED ORDER — LEVOTHYROXINE SODIUM 88 MCG PO TABS
88.0000 ug | ORAL_TABLET | Freq: Every day | ORAL | Status: DC
Start: 2024-02-20 — End: 2024-02-21
  Administered 2024-02-20 – 2024-02-21 (×2): 88 ug via ORAL
  Filled 2024-02-20 (×2): qty 1

## 2024-02-20 MED ORDER — SENNA 8.6 MG PO TABS
1.0000 | ORAL_TABLET | Freq: Two times a day (BID) | ORAL | Status: DC
  Administered 2024-02-20 – 2024-02-21 (×4): 8.6 mg via ORAL
  Filled 2024-02-20 (×4): qty 1

## 2024-02-20 MED ORDER — POLYETHYLENE GLYCOL 3350 17 G PO PACK
17.0000 g | PACK | Freq: Two times a day (BID) | ORAL | Status: DC
  Administered 2024-02-20 – 2024-02-21 (×3): 17 g via ORAL
  Filled 2024-02-20 (×4): qty 1

## 2024-02-20 MED ORDER — MELATONIN 5 MG PO TABS
10.0000 mg | ORAL_TABLET | Freq: Every day | ORAL | Status: DC
  Administered 2024-02-20 (×2): 10 mg via ORAL
  Filled 2024-02-20 (×2): qty 2

## 2024-02-20 MED ORDER — ACETAMINOPHEN 650 MG RE SUPP: 650.0000 mg | Freq: Four times a day (QID) | RECTAL | Status: DC | PRN

## 2024-02-20 MED ORDER — SMOG ENEMA
960.0000 mL | Freq: Once | RECTAL | Status: AC
  Administered 2024-02-20: 960 mL via RECTAL
  Filled 2024-02-20: qty 960

## 2024-02-20 MED ORDER — ONDANSETRON HCL 4 MG/2ML IJ SOLN: 4.0000 mg | Freq: Four times a day (QID) | INTRAMUSCULAR | Status: DC | PRN

## 2024-02-20 MED ORDER — ENOXAPARIN SODIUM 40 MG/0.4ML IJ SOSY
40.0000 mg | PREFILLED_SYRINGE | INTRAMUSCULAR | Status: DC
  Administered 2024-02-20: 40 mg via SUBCUTANEOUS
  Filled 2024-02-20: qty 0.4

## 2024-02-20 NOTE — Hospital Course (Signed)
 Darlene Ballard is a 78 y.o. female with medical history significant for Alzheimer's dementia, hypothyroidism, anxiety who is admitted with severe constipation and stercoral colitis.

## 2024-02-20 NOTE — Plan of Care (Signed)

## 2024-02-20 NOTE — Progress Notes (Signed)
 [facility] ROUNDING NOTE TEKIRA TABACCO ION:[id number]  DOB: Feb 28, 1946  DOA: 02/19/2024  PCP: Glynn Lasso, PA-C  02/20/2024,8:08 AM  LOS: 0 days    Code Status: Full code   from: [facility] since January 2025 current Dispo: Likely home   78 year old female known advanced dementia at memory center Hypothyroid frequent UTIs previous diarrhea on constipating agents Anxiety She follows with neurology for dementia is on Prozac  and Seroquel  for behaviors and is no longer on medication for  Apparently treated 1 week ago with Keflex  for UTI 3 months ago treated for diarrhea with Colestid and has been constipated over the last week urgency and straining and discomfort pain etc. nonresponsive to MiraLAX--previously has seen [facility] Sodium 139 potassium 3.4 BUN/creatinine 17/0.7 LFTs normal WBC 6.7 hemoglobin 12.9 FOBT negative CT abdomen/pelvis large volume of stool with large stool ball distending the rectum rectal wall thickening with perirectal edema was fecal impaction fibroid uterus 2.4 cm  Plan  Severe constipation stercoral colitis Passing stool now--has some liquid stool stool ball seems to have come out after smog enema Will reassess over the next 24 hours and hold any constipating agents-continues on MiraLAX 17 twice daily senna twice daily hold Dulcolax suppository for now--- would not resume colestipol at discharge instead use as needed only Not sure if really needs antibiotics would discontinue ceftriaxone  Flagyl If having regular stools likely can discharge home  alzheimer dementia No Alzheimer's specific medication is no longer effective Continue Prozac  20 daily, Seroquel  37.5 at bedtime can use melatonin 10 at bedtime  Hypothyroid Continue Synthroid  88 daily  Frequent UTIs Risk-benefit regarding suppressive therapy  DVT prophylaxis: Lovenox   Status is: Observation The patient remains OBS appropriate and will d/c before 2 midnights.       Subjective: Incoherent but has stool ball at the introitus as well as anus Does not appear to be in distress A little tender in abdominal  Objective + exam Vitals:   02/19/24 2045 02/19/24 2320 02/20/24 0020 02/20/24 0431  BP: (!) 141/113 (!) 157/81 (!) 163/84 132/80  Pulse: (!) 58 (!) 59 (!) 56 (!) 59  Resp: 20 13 18 18   Temp:   98.4 F (36.9 C) 97.9 F (36.6 C)  TempSrc:   Oral Oral  SpO2: 98% 100% 100% 99%  Weight:      Height:       Filed Weights   02/19/24 1958  Weight: 51.8 kg    Examination: EOMI NCAT no focal deficit no icterus no pallor no wheeze no rales or rhonchi S1-S2 no murmur no rub no gallop ROM intact Power 5/5 Abd is a little bit tender no rebound no guarding Stool ball noted outside anus  Data Reviewed: reviewed   CBC    Component Value Date/Time   WBC 4.6 02/20/2024 0414   RBC 3.97 02/20/2024 0414   HGB 12.4 02/20/2024 0414   HGB 13.5 10/06/2023 0936   HCT 36.9 02/20/2024 0414   HCT 40.2 10/06/2023 0936   PLT 169 02/20/2024 0414   PLT 173 10/06/2023 0936   MCV 92.9 02/20/2024 0414   MCV 94 10/06/2023 0936   MCH 31.2 02/20/2024 0414   MCHC 33.6 02/20/2024 0414   RDW 13.5 02/20/2024 0414   RDW 13.0 10/06/2023 0936   LYMPHSABS 1.3 02/19/2024 1836   LYMPHSABS 1.3 06/26/2021 1049   MONOABS 0.5 02/19/2024 1836   EOSABS 0.1 02/19/2024 1836   EOSABS 0.2 06/26/2021 1049   BASOSABS 0.1 02/19/2024 1836   BASOSABS 0.1 06/26/2021  1049      Latest Ref Rng & Units 02/20/2024    4:14 AM 02/19/2024    6:36 PM 10/06/2023    9:36 AM  CMP  Glucose 70 - 99 mg/dL 92  96  72   BUN 8 - 23 mg/dL 12  17  11    Creatinine 0.44 - 1.00 mg/dL 1.61  0.96  0.45   Sodium 135 - 145 mmol/L 136  139  140   Potassium 3.5 - 5.1 mmol/L 3.6  3.4  3.8   Chloride 98 - 111 mmol/L 107  106  101   CO2 22 - 32 mmol/L 24  23  25    Calcium 8.9 - 10.3 mg/dL 8.3  8.7  8.8   Total Protein 6.5 - 8.1 g/dL  6.1    Total Bilirubin 0.0 - 1.2 mg/dL  0.5    Alkaline Phos 38 -  126 U/L  68    AST 15 - 41 U/L  16    ALT 0 - 44 U/L  13      Scheduled Meds:  enoxaparin  (LOVENOX ) injection  40 mg Subcutaneous Q24H   FLUoxetine   20 mg Oral Daily   levothyroxine   88 mcg Oral Daily   melatonin  10 mg Oral QHS   polyethylene glycol  17 g Oral BID   QUEtiapine   37.5 mg Oral QHS   senna  1 tablet Oral BID   Continuous Infusions:  cefTRIAXone  (ROCEPHIN )  IV 1 g (02/20/24 0320)   lactated ringers  Stopped (02/20/24 4098)   metronidazole 500 mg (02/20/24 0317)    Time  33  Verlie Glisson, MD  [facility]

## 2024-02-20 NOTE — Care Management Obs Status (Signed)
 MEDICARE OBSERVATION STATUS NOTIFICATION   Patient Details  Name: Darlene Ballard MRN: [id number] Date of Birth: 1946-02-29   Medicare Observation Status Notification Given:  Yes    Jannine Meo, RN 02/20/2024, 2:54 PM

## 2024-02-20 NOTE — Progress Notes (Signed)
 Patient very confused, cursing staff, climbing out of the bed refusing to follow commands. Called daughter she stated it was just her Alzheimer's no words of comfort given. MD notified.  Attempting to get a sitter

## 2024-02-20 NOTE — Plan of Care (Signed)
  Problem: Clinical Measurements: Goal: Will remain free from infection Outcome: Progressing   Problem: Clinical Measurements: Goal: Cardiovascular complication will be avoided Outcome: Progressing   Problem: Activity: Goal: Risk for activity intolerance will decrease Outcome: Progressing   Problem: Nutrition: Goal: Adequate nutrition will be maintained Outcome: Progressing   Problem: Coping: Goal: Level of anxiety will decrease Outcome: Progressing   Problem: Safety: Goal: Ability to remain free from injury will improve Outcome: Progressing

## 2024-02-21 DIAGNOSIS — K5289 Other specified noninfective gastroenteritis and colitis: Secondary | ICD-10-CM | POA: Diagnosis not present

## 2024-02-21 LAB — CBC WITH DIFFERENTIAL/PLATELET
Abs Immature Granulocytes: 0.01 10*3/uL (ref 0.00–0.07)
Basophils Absolute: 0 10*3/uL (ref 0.0–0.1)
Basophils Relative: 1 %
Eosinophils Absolute: 0.1 10*3/uL (ref 0.0–0.5)
Eosinophils Relative: 2 %
HCT: 40.4 % (ref 36.0–46.0)
Hemoglobin: 13.6 g/dL (ref 12.0–15.0)
Immature Granulocytes: 0 %
Lymphocytes Relative: 31 %
Lymphs Abs: 1.6 10*3/uL (ref 0.7–4.0)
MCH: 31.6 pg (ref 26.0–34.0)
MCHC: 33.7 g/dL (ref 30.0–36.0)
MCV: 94 fL (ref 80.0–100.0)
Monocytes Absolute: 0.5 10*3/uL (ref 0.1–1.0)
Monocytes Relative: 10 %
Neutro Abs: 2.8 10*3/uL (ref 1.7–7.7)
Neutrophils Relative %: 56 %
Platelets: UNDETERMINED 10*3/uL (ref 150–400)
RBC: 4.3 MIL/uL (ref 3.87–5.11)
RDW: 13.9 % (ref 11.5–15.5)
WBC: 5.1 10*3/uL (ref 4.0–10.5)
nRBC: 0 % (ref 0.0–0.2)

## 2024-02-21 LAB — BASIC METABOLIC PANEL WITH GFR
Anion gap: 9 (ref 5–15)
BUN: 12 mg/dL (ref 8–23)
CO2: 25 mmol/L (ref 22–32)
Calcium: 8.9 mg/dL (ref 8.9–10.3)
Chloride: 102 mmol/L (ref 98–111)
Creatinine, Ser: 0.71 mg/dL (ref 0.44–1.00)
GFR, Estimated: >60 mL/min (ref >60)
Glucose, Bld: 92 mg/dL (ref 70–99)
Potassium: 3.9 mmol/L (ref 3.5–5.1)
Sodium: 136 mmol/L (ref 135–145)

## 2024-02-21 MED ORDER — SENNA 8.6 MG PO TABS: 1.0000 | ORAL_TABLET | Freq: Every evening | ORAL | 0 refills | Status: AC | PRN

## 2024-02-21 MED ORDER — POLYETHYLENE GLYCOL 3350 17 G PO PACK: 17.0000 g | PACK | Freq: Every day | ORAL | 0 refills | Status: AC

## 2024-02-21 NOTE — Discharge Summary (Signed)
 Physician Discharge Summary  Darlene Ballard ZOX:[id number] DOB: 02-May-1946 DOA: 02/19/2024  PCP: Glynn Lasso, PA-C  Admit date: 02/19/2024 Discharge date: 02/21/2024  Time spent: 26 minutes  Recommendations for Outpatient Follow-up:  See dosage changes and medication changes with colestipol and addition of MiraLAX and senna Get TSH in 3 weeks, CBC Chem-7 in 1 week Continue to work on goals of care as an outpatient  Discharge Diagnoses:  MAIN problem for hospitalization   Intractable abdominal pain secondary to stercoral colitis  Please see below for itemized issues addressed in [facility]- refer to other progress notes for clarity if needed  Discharge Condition: Improved  Diet recommendation: Regular  Filed Weights   02/19/24 1958  Weight: 51.8 kg    History of present illness:   78 year old female known advanced dementia at memory center Hypothyroid frequent UTIs previous diarrhea on constipating agents Anxiety She follows with neurology for dementia is on Prozac  and Seroquel  for behaviors and is no longer on medication for   Apparently treated 1 week ago with Keflex  for UTI 3 months ago treated for diarrhea with Colestid and has been constipated over the last week urgency and straining and discomfort pain etc. nonresponsive to MiraLAX--previously has seen [facility] Sodium 139 potassium 3.4 BUN/creatinine 17/0.7 LFTs normal WBC 6.7 hemoglobin 12.9 FOBT negative CT abdomen/pelvis large volume of stool with large stool ball distending the rectum rectal wall thickening with perirectal edema was fecal impaction fibroid uterus 2.4 cm   Plan   Severe constipation stercoral colitis Had smog enema with good results passing regular stool Discontinued this admission colestipol Sent home on regimen of MiraLAX daily and senna nightly as needed if no stool in 1 day Would not resume constipating agents such as colestipol unless she has more than 2-3 stools a day Stable for  discharge no need for antibiotics   alzheimer dementia No Alzheimer's specific medication is no longer effective Continue Prozac  20 daily, Seroquel  37.5 at bedtime can use melatonin 10 at bedtime   Hypothyroid Continue Synthroid  88 daily-recommend TSH in 3 to 4 weeks   Frequent UTIs Risk-benefit regarding suppressive therapy  Fibroid uterus Needs outpatient discussion  Discharge Exam: Vitals:   02/21/24 0549 02/21/24 0754  BP: (!) 156/87 135/82  Pulse: 64 66  Resp: 18 16  Temp: 97.7 F (36.5 C)   SpO2: 100% 100%    Subj on day of d/c  Coherent x 1 awake alert no distress She is slightly confused but looks like she is not in any discomfort She had a full breakfast this morning and is redirectable menance soft no rebound Chest is clear  Discharge Instructions   Discharge Instructions     Diet - low sodium heart healthy   Complete by: As directed    Discharge instructions   Complete by: As directed    Do not use colestipol for the time being and instead use MiraLAX once daily and if needed add senna at night if she does not have a stool in 1 or 2 days If she has more than 4 stools a day resume colestipol Get labs in the outpatient setting Follow-up with primary physician in 1 week   Increase activity slowly   Complete by: As directed       Allergies as of 02/21/2024       Reactions   Yellow Jacket Venom Anaphylaxis, Swelling   Paxil [paroxetine] Other (See Comments)   "Anger issues"   Zoloft [sertraline] Other (See Comments)   "Anger issues"  Medication List     STOP taking these medications    colestipol 1 g tablet Commonly known as: COLESTID       TAKE these medications    FLUoxetine  20 MG capsule Commonly known as: PROZAC  Take 1 capsule (20 mg total) by mouth daily.   levothyroxine  88 MCG tablet Commonly known as: SYNTHROID  Take 88 mcg by mouth daily.   MELATONIN GUMMIES PO Take 10 mg by mouth.   polyethylene glycol 17 g  packet Commonly known as: MIRALAX / GLYCOLAX Take 17 g by mouth daily.   QUEtiapine  25 MG tablet Commonly known as: SEROquel  Take one and half tablet at night (37.5 mg) , may increase  to 50 mg nightly if needed   senna 8.6 MG Tabs tablet Commonly known as: SENOKOT Take 1 tablet (8.6 mg total) by mouth at bedtime as needed for mild constipation.       Allergies  Allergen Reactions   Yellow Jacket Venom Anaphylaxis and Swelling   Paxil [Paroxetine] Other (See Comments)    "Anger issues"   Zoloft [Sertraline] Other (See Comments)    "Anger issues"      The results of significant diagnostics from this hospitalization (including imaging, microbiology, ancillary and laboratory) are listed below for reference.    Significant Diagnostic Studies: CT ABDOMEN PELVIS W CONTRAST Result Date: 02/19/2024 CLINICAL DATA:  Left lower quadrant pain. Right lower quadrant pain. EXAM: CT ABDOMEN AND PELVIS WITH CONTRAST TECHNIQUE: Multidetector CT imaging of the abdomen and pelvis was performed using the standard protocol following bolus administration of intravenous contrast. RADIATION DOSE REDUCTION: This exam was performed according to the departmental dose-optimization program which includes automated exposure control, adjustment of the mA and/or kV according to patient size and/or use of iterative reconstruction technique. CONTRAST:  75mL OMNIPAQUE  IOHEXOL  350 MG/ML SOLN COMPARISON:  CT 09/13/2023, additional priors reviewed. FINDINGS: Lower chest: Subsegmental atelectasis or scarring in the lung bases. No acute airspace disease. Moderate hiatal hernia. Hepatobiliary: No focal liver abnormality. Punctate hepatic granuloma. Unremarkable gallbladder. No calcified gallstone or pericholecystic inflammation. No biliary dilatation. Pancreas: No ductal dilatation or inflammation. Spleen: Normal in size without focal abnormality. Adrenals/Urinary Tract: No adrenal nodule. No hydronephrosis, renal calculi or  suspicious renal lesion. Normal urinary bladder for degree of distension. Stomach/Bowel: Small to moderate hiatal hernia again seen. There is no small bowel distension or evidence of obstruction. Large volume of stool throughout the colon. There is a large stool ball distending the rectum. The rectum spans 10.2 cm. Rectal wall thickening with mild perirectal edema. No pneumatosis. Vascular/Lymphatic: Aortic atherosclerosis and tortuosity. No aneurysm. No abdominopelvic adenopathy. Reproductive: Calcified uterine fibroids. Simple appearing right adnexal cyst measuring 2.4 cm is unchanged over prior exams. Other: For anterior diaphragmatic hernia in the right lower hemithorax contains a small amount of fat and free fluid. This is slightly increased in size from prior exam. No abdominal ascites. No free air. Musculoskeletal: Stable T12 and L5 superior endplate compression deformity. Advanced lower lumbar facet hypertrophy. IMPRESSION: 1. Large volume of stool throughout the colon with large stool ball distending the rectum. Rectal wall thickening with mild perirectal edema, suspicious for fecal impaction and stercoral colitis. 2. Small to moderate hiatal hernia. 3. Anterior diaphragmatic hernia in the right lower hemithorax contains a small amount of fat and free fluid. This is slightly increased in size from prior exam. 4. Again seen calcified uterine fibroids. Stable 2.4 cm benign-appearing right adnexal cyst. Per consensus guidelines, no follow-up imaging is recommended. Aortic Atherosclerosis (ICD10-I70.0).  Electronically Signed   By: Chadwick Colonel M.D.   On: 02/19/2024 21:19    Microbiology: Recent Results (from the past 240 hours)  Urine Culture     Status: None   Collection Time: 02/11/24  9:15 AM   Specimen: Urine   UR  Result Value Ref Range Status   Urine Culture, Routine Final report  Final   Organism ID, Bacteria Comment  Final    Comment: Culture shows less than 10,000 colony forming units  of bacteria per milliliter of urine. This colony count is not generally considered to be clinically significant.      Labs: Basic Metabolic Panel: Recent Labs  Lab 02/19/24 1836 02/20/24 0414 02/21/24 0611  NA 139 136 136  K 3.4* 3.6 3.9  CL 106 107 102  CO2 23 24 25   GLUCOSE 96 92 92  BUN 17 12 12   CREATININE 0.77 0.62 0.71  CALCIUM 8.7* 8.3* 8.9   Liver Function Tests: Recent Labs  Lab 02/19/24 1836  AST 16  ALT 13  ALKPHOS 68  BILITOT 0.5  PROT 6.1*  ALBUMIN 3.6   Recent Labs  Lab 02/19/24 1836  LIPASE 39   No results for input(s): "AMMONIA" in the last 168 hours. CBC: Recent Labs  Lab 02/19/24 1836 02/20/24 0414 02/21/24 0611  WBC 6.7 4.6 5.1  NEUTROABS 4.8  --  2.8  HGB 12.9 12.4 13.6  HCT 38.1 36.9 40.4  MCV 93.2 92.9 94.0  PLT 172 169 PLATELET CLUMPS NOTED ON SMEAR, UNABLE TO ESTIMATE   Cardiac Enzymes: No results for input(s): "CKTOTAL", "CKMB", "CKMBINDEX", "TROPONINI" in the last 168 hours. BNP: BNP (last 3 results) No results for input(s): "BNP" in the last 8760 hours.  ProBNP (last 3 results) No results for input(s): "PROBNP" in the last 8760 hours.  CBG: No results for input(s): "GLUCAP" in the last 168 hours.  Signed:  Verlie Glisson MD   [facility] 02/21/2024, 8:24 AM

## 2024-02-21 NOTE — Plan of Care (Signed)
  Problem: Education: Goal: Knowledge of General Education information will improve Description: Including pain rating scale, medication(s)/side effects and non-pharmacologic comfort measures 02/21/2024 1001 by Benyamin Jeff, Erminio Hazy, RN Outcome: Adequate for Discharge 02/21/2024 1001 by Lugene Hitt, Erminio Hazy, RN Outcome: Progressing   Problem: Health Behavior/Discharge Planning: Goal: Ability to manage health-related needs will improve 02/21/2024 1001 by Consuella Scurlock, Erminio Hazy, RN Outcome: Adequate for Discharge 02/21/2024 1001 by Loran Rock, RN Outcome: Progressing   Problem: Clinical Measurements: Goal: Ability to maintain clinical measurements within normal limits will improve 02/21/2024 1001 by Brytnee Bechler, Erminio Hazy, RN Outcome: Adequate for Discharge 02/21/2024 1001 by Loran Rock, RN Outcome: Progressing Goal: Will remain free from infection 02/21/2024 1001 by Loran Rock, RN Outcome: Adequate for Discharge 02/21/2024 1001 by Loran Rock, RN Outcome: Progressing Goal: Diagnostic test results will improve 02/21/2024 1001 by Loran Rock, RN Outcome: Adequate for Discharge 02/21/2024 1001 by Loran Rock, RN Outcome: Progressing Goal: Respiratory complications will improve 02/21/2024 1001 by Loran Rock, RN Outcome: Adequate for Discharge 02/21/2024 1001 by Loran Rock, RN Outcome: Progressing Goal: Cardiovascular complication will be avoided 02/21/2024 1001 by Loran Rock, RN Outcome: Adequate for Discharge 02/21/2024 1001 by Loran Rock, RN Outcome: Progressing   Problem: Activity: Goal: Risk for activity intolerance will decrease 02/21/2024 1001 by Julanne Schlueter, Erminio Hazy, RN Outcome: Adequate for Discharge 02/21/2024 1001 by Loran Rock, RN Outcome: Progressing   Problem: Nutrition: Goal: Adequate nutrition will be maintained 02/21/2024 1001 by Loran Rock, RN Outcome:  Adequate for Discharge 02/21/2024 1001 by Loran Rock, RN Outcome: Progressing   Problem: Coping: Goal: Level of anxiety will decrease 02/21/2024 1001 by Loran Rock, RN Outcome: Adequate for Discharge 02/21/2024 1001 by Loran Rock, RN Outcome: Progressing   Problem: Elimination: Goal: Will not experience complications related to bowel motility 02/21/2024 1001 by Loran Rock, RN Outcome: Adequate for Discharge 02/21/2024 1001 by Loran Rock, RN Outcome: Progressing Goal: Will not experience complications related to urinary retention 02/21/2024 1001 by Evva Din, Erminio Hazy, RN Outcome: Adequate for Discharge 02/21/2024 1001 by Loran Rock, RN Outcome: Progressing   Problem: Pain Managment: Goal: General experience of comfort will improve and/or be controlled 02/21/2024 1001 by Cadon Raczka, Erminio Hazy, RN Outcome: Adequate for Discharge 02/21/2024 1001 by Loran Rock, RN Outcome: Progressing   Problem: Safety: Goal: Ability to remain free from injury will improve 02/21/2024 1001 by Ettie Krontz, Erminio Hazy, RN Outcome: Adequate for Discharge 02/21/2024 1001 by Loran Rock, RN Outcome: Progressing   Problem: Skin Integrity: Goal: Risk for impaired skin integrity will decrease 02/21/2024 1001 by Loran Rock, RN Outcome: Adequate for Discharge 02/21/2024 1001 by Loran Rock, RN Outcome: Progressing

## 2024-02-26 ENCOUNTER — Ambulatory Visit: Payer: PPO | Admitting: Physician Assistant

## 2024-02-26 ENCOUNTER — Encounter: Payer: Self-pay | Admitting: Physician Assistant

## 2024-02-26 VITALS — BP 115/71 | HR 67 | Ht 60.0 in | Wt 111.0 lb

## 2024-02-26 DIAGNOSIS — F02818 Dementia in other diseases classified elsewhere, unspecified severity, with other behavioral disturbance: Secondary | ICD-10-CM | POA: Diagnosis not present

## 2024-02-26 DIAGNOSIS — G301 Alzheimer's disease with late onset: Secondary | ICD-10-CM | POA: Diagnosis not present
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
## 2024-02-26 NOTE — Patient Instructions (Signed)
 It was a pleasure to see you today at our office.   Recommendations:  Follow up in Dec 8 at 11:30  Agree with  24/7 care   Continue Prozac    20 mg daily, may go up to 40 mg in the future if needed Continue Seroquel  50 mg nightly    Whom to call:  Memory  decline, memory medications: Call our office [phone]   For psychiatric meds, mood meds: Please have your primary care physician manage these medications.      For assessment of decision of mental capacity and competency:  Call Dr. Laverne Potter, geriatric psychiatrist at [phone]  For guidance in geriatric dementia issues please call [facility] [phone]  Call [facility] [email or web address]    If you have any severe symptoms of a stroke, or other severe issues such as confusion,severe chills or fever, etc call 911 or go to the ER as you may need to be evaluated further     RECOMMENDATIONS FOR ALL PATIENTS WITH MEMORY PROBLEMS: 1. Continue to exercise (Recommend 30 minutes of walking everyday, or 3 hours every week) 2. Increase social interactions - continue going to [place] and enjoy social gatherings with friends and family 3. Eat healthy, avoid fried foods and eat more fruits and vegetables 4. Maintain adequate blood pressure, blood sugar, and blood cholesterol level. Reducing the risk of stroke and cardiovascular disease also helps promoting better memory. 5. Avoid stressful situations. Live a simple life and avoid aggravations. Organize your time and prepare for the next day in anticipation. 6. Sleep well, avoid any interruptions of sleep and avoid any distractions in the bedroom that may interfere with adequate sleep quality 7. Avoid sugar, avoid sweets as there is a strong link between excessive sugar intake, diabetes, and cognitive impairment We discussed the Mediterranean diet, which has been shown to help patients reduce the risk of progressive memory disorders  and reduces cardiovascular risk. This includes eating fish, eat fruits and green leafy vegetables, nuts like almonds and hazelnuts, walnuts, and also use olive oil. Avoid fast foods and fried foods as much as possible. Avoid sweets and sugar as sugar use has been linked to worsening of memory function.  There is always a concern of gradual progression of memory problems. If this is the case, then we may need to adjust level of care according to patient needs. Support, both to the patient and caregiver, should then be put into place.    The Alzheimer's Association is here all day, every day for people facing Alzheimer's disease through our free 24/7 Helpline: 8731274802. The Helpline provides reliable information and support to all those who need assistance, such as individuals living with memory loss, Alzheimer's or other dementia, caregivers, health care professionals and the public.  Our highly trained and knowledgeable staff can help you with: Understanding memory loss, dementia and Alzheimer's  Medications and other treatment options  General information about aging and brain health  Skills to provide quality care and to find the best care from professionals  Legal, financial and living-arrangement decisions Our Helpline also features: Confidential care consultation provided by master's level clinicians who can help with decision-making support, crisis assistance and education on issues families face every day  Help in a caller's preferred language using our translation service that features more than 200 languages and dialects  Referrals to local community programs, services and ongoing support     FALL PRECAUTIONS: Be cautious when walking. Scan the area for obstacles that may increase  the risk of trips and falls. When getting up in the mornings, sit up at the edge of the bed for a few minutes before getting out of bed. Consider elevating the bed at the head end to avoid drop of blood  pressure when getting up. Walk always in a well-lit room (use night lights in the walls). Avoid area rugs or power cords from appliances in the middle of the walkways. Use a walker or a cane if necessary and consider physical therapy for balance exercise. Get your eyesight checked regularly.  FINANCIAL OVERSIGHT: Supervision, especially oversight when making financial decisions or transactions is also recommended.  HOME SAFETY: Consider the safety of the kitchen when operating appliances like stoves, microwave oven, and blender. Consider having supervision and share cooking responsibilities until no longer able to participate in those. Accidents with firearms and other hazards in the house should be identified and addressed as well.   ABILITY TO BE LEFT ALONE: If patient is unable to contact 911 operator, consider using LifeLine, or when the need is there, arrange for someone to stay with patients. Smoking is a fire hazard, consider supervision or cessation. Risk of wandering should be assessed by caregiver and if detected at any point, supervision and safe proof recommendations should be instituted.  MEDICATION SUPERVISION: Inability to self-administer medication needs to be constantly addressed. Implement a mechanism to ensure safe administration of the medications.          Mediterranean Diet A Mediterranean diet refers to food and lifestyle choices that are based on the traditions of countries located on the [employer]. This way of eating has been shown to help prevent certain conditions and improve outcomes for people who have chronic diseases, like kidney disease and heart disease. What are tips for following this plan? Lifestyle  Cook and eat meals together with your family, when possible. Drink enough fluid to keep your urine clear or pale yellow. Be physically active every day. This includes: Aerobic exercise like running or swimming. Leisure activities like gardening,  walking, or housework. Get 7-8 hours of sleep each night. If recommended by your health care provider, drink red wine in moderation. This means 1 glass a day for nonpregnant women and 2 glasses a day for men. A glass of wine equals 5 oz (150 mL). Reading food labels  Check the serving size of packaged foods. For foods such as rice and pasta, the serving size refers to the amount of cooked product, not dry. Check the total fat in packaged foods. Avoid foods that have saturated fat or trans fats. Check the ingredients list for added sugars, such as corn syrup. Shopping  At the grocery store, buy most of your food from the areas near the walls of the store. This includes: Fresh fruits and vegetables (produce). Grains, beans, nuts, and seeds. Some of these may be available in unpackaged forms or large amounts (in bulk). Fresh seafood. Poultry and eggs. Low-fat dairy products. Buy whole ingredients instead of prepackaged foods. Buy fresh fruits and vegetables in-season from local farmers markets. Buy frozen fruits and vegetables in resealable bags. If you do not have access to quality fresh seafood, buy precooked frozen shrimp or canned fish, such as tuna, salmon, or sardines. Buy small amounts of raw or cooked vegetables, salads, or olives from the deli or salad bar at your store. Stock your pantry so you always have certain foods on hand, such as olive oil, canned tuna, canned tomatoes, rice, pasta, and beans. Cooking  Cook foods with extra-virgin olive oil instead of using butter or other vegetable oils. Have meat as a side dish, and have vegetables or grains as your main dish. This means having meat in small portions or adding small amounts of meat to foods like pasta or stew. Use beans or vegetables instead of meat in common dishes like chili or lasagna. Experiment with different cooking methods. Try roasting or broiling vegetables instead of steaming or sauteing them. Add frozen vegetables  to soups, stews, pasta, or rice. Add nuts or seeds for added healthy fat at each meal. You can add these to yogurt, salads, or vegetable dishes. Marinate fish or vegetables using olive oil, lemon juice, garlic, and fresh herbs. Meal planning  Plan to eat 1 vegetarian meal one day each week. Try to work up to 2 vegetarian meals, if possible. Eat seafood 2 or more times a week. Have healthy snacks readily available, such as: Vegetable sticks with hummus. Greek yogurt. Fruit and nut trail mix. Eat balanced meals throughout the week. This includes: Fruit: 2-3 servings a day Vegetables: 4-5 servings a day Low-fat dairy: 2 servings a day Fish, poultry, or lean meat: 1 serving a day Beans and legumes: 2 or more servings a week Nuts and seeds: 1-2 servings a day Whole grains: 6-8 servings a day Extra-virgin olive oil: 3-4 servings a day Limit red meat and sweets to only a few servings a month What are my food choices? Mediterranean diet Recommended Grains: Whole-grain pasta. Brown rice. Bulgar wheat. Polenta. Couscous. Whole-wheat bread. Dwyane Glad. Vegetables: Artichokes. Beets. Broccoli. Cabbage. Carrots. Eggplant. Green beans. Chard. Kale. Spinach. Onions. Leeks. Peas. Squash. Tomatoes. Peppers. Radishes. Fruits: Apples. Apricots. Avocado. Berries. Bananas. Cherries. Dates. Figs. Grapes. Lemons. Melon. Oranges. Peaches. Plums. Pomegranate. Meats and other protein foods: Beans. Almonds. Sunflower seeds. Pine nuts. Peanuts. Cod. Salmon. Scallops. Shrimp. Tuna. Tilapia. Clams. Oysters. Eggs. Dairy: Low-fat milk. Cheese. Greek yogurt. Beverages: Water . Red wine. Herbal tea. Fats and oils: Extra virgin olive oil. Avocado oil. Grape seed oil. Sweets and desserts: [place] yogurt with honey. Baked apples. Poached pears. Trail mix. Seasoning and other foods: Basil. Cilantro. Coriander. Cumin. Mint. Parsley. Sage. Rosemary. Tarragon. Garlic. Oregano. Thyme. Pepper. Balsalmic vinegar. Tahini.  Hummus. Tomato sauce. Olives. Mushrooms. Limit these Grains: Prepackaged pasta or rice dishes. Prepackaged cereal with added sugar. Vegetables: Deep fried potatoes (french fries). Fruits: Fruit canned in syrup. Meats and other protein foods: Beef. Pork. Lamb. Poultry with skin. Hot dogs. Helene Loader. Dairy: Ice cream. Sour cream. Whole milk. Beverages: Juice. Sugar-sweetened soft drinks. Beer. Liquor and spirits. Fats and oils: Butter. Canola oil. Vegetable oil. Beef fat (tallow). Lard. Sweets and desserts: Cookies. Cakes. Pies. Candy. Seasoning and other foods: Mayonnaise. Premade sauces and marinades. The items listed may not be a complete list. Talk with your dietitian about what dietary choices are right for you. Summary The Mediterranean diet includes both food and lifestyle choices. Eat a variety of fresh fruits and vegetables, beans, nuts, seeds, and whole grains. Limit the amount of red meat and sweets that you eat. Talk with your health care provider about whether it is safe for you to drink red wine in moderation. This means 1 glass a day for nonpregnant women and 2 glasses a day for men. A glass of wine equals 5 oz (150 mL). This information is not intended to replace advice given to you by your health care provider. Make sure you discuss any questions you have with your health care provider. Document Released: 05/22/2016 Document  Revised: 06/24/2016 Document Reviewed: 05/22/2016 Elsevier Interactive Patient Education  2017 [employer].

## 2024-03-10 DIAGNOSIS — K5900 Constipation, unspecified: Secondary | ICD-10-CM | POA: Diagnosis not present

## 2024-03-14 ENCOUNTER — Telehealth: Payer: Self-pay | Admitting: Medical

## 2024-03-14 MED ORDER — LEVOTHYROXINE SODIUM 88 MCG PO TABS: 88.0000 ug | ORAL_TABLET | Freq: Every day | ORAL | 1 refills | Status: AC

## 2024-03-14 NOTE — Telephone Encounter (Signed)
 refilled

## 2024-03-14 NOTE — Telephone Encounter (Signed)
 Shelvy Dickens stopped by and states she spoke with Darlene Ballard about refilling pts levothyroxine  due to her not being in the memory care facility anymore. She is now needing a refill to [facility] - [place], [place] - [address]

## 2024-03-15 ENCOUNTER — Other Ambulatory Visit: Payer: Self-pay | Admitting: Medical

## 2024-03-17 ENCOUNTER — Encounter (HOSPITAL_COMMUNITY): Payer: Self-pay

## 2024-03-17 ENCOUNTER — Other Ambulatory Visit: Payer: Self-pay

## 2024-03-17 ENCOUNTER — Emergency Department (HOSPITAL_COMMUNITY)

## 2024-03-17 ENCOUNTER — Observation Stay (HOSPITAL_COMMUNITY)
Admission: EM | Admit: 2024-03-17 | Discharge: 2024-03-22 | Disposition: A | Discharge status: Skilled Nursing Facility | Attending: Internal Medicine | Admitting: Internal Medicine

## 2024-03-17 DIAGNOSIS — E039 Hypothyroidism, unspecified: Secondary | ICD-10-CM | POA: Diagnosis not present

## 2024-03-17 DIAGNOSIS — W19XXXA Unspecified fall, initial encounter: Principal | ICD-10-CM

## 2024-03-17 DIAGNOSIS — W1830XA Fall on same level, unspecified, initial encounter: Secondary | ICD-10-CM | POA: Diagnosis not present

## 2024-03-17 DIAGNOSIS — F03918 Unspecified dementia, unspecified severity, with other behavioral disturbance: Secondary | ICD-10-CM | POA: Diagnosis present

## 2024-03-17 DIAGNOSIS — M25462 Effusion, left knee: Secondary | ICD-10-CM | POA: Diagnosis not present

## 2024-03-17 DIAGNOSIS — M79652 Pain in left thigh: Secondary | ICD-10-CM | POA: Diagnosis not present

## 2024-03-17 DIAGNOSIS — Z87440 Personal history of urinary (tract) infections: Secondary | ICD-10-CM | POA: Insufficient documentation

## 2024-03-17 DIAGNOSIS — G309 Alzheimer's disease, unspecified: Secondary | ICD-10-CM | POA: Diagnosis not present

## 2024-03-17 DIAGNOSIS — F419 Anxiety disorder, unspecified: Secondary | ICD-10-CM | POA: Diagnosis not present

## 2024-03-17 DIAGNOSIS — M25552 Pain in left hip: Secondary | ICD-10-CM | POA: Diagnosis not present

## 2024-03-17 DIAGNOSIS — M25562 Pain in left knee: Secondary | ICD-10-CM | POA: Diagnosis not present

## 2024-03-17 DIAGNOSIS — Z66 Do not resuscitate: Secondary | ICD-10-CM

## 2024-03-17 DIAGNOSIS — S32591A Other specified fracture of right pubis, initial encounter for closed fracture: Secondary | ICD-10-CM | POA: Diagnosis present

## 2024-03-17 DIAGNOSIS — M25561 Pain in right knee: Secondary | ICD-10-CM | POA: Diagnosis not present

## 2024-03-17 DIAGNOSIS — Z6821 Body mass index (BMI) 21.0-21.9, adult: Secondary | ICD-10-CM | POA: Insufficient documentation

## 2024-03-17 DIAGNOSIS — I10 Essential (primary) hypertension: Secondary | ICD-10-CM | POA: Diagnosis present

## 2024-03-17 DIAGNOSIS — Z8719 Personal history of other diseases of the digestive system: Secondary | ICD-10-CM | POA: Insufficient documentation

## 2024-03-17 DIAGNOSIS — M160 Bilateral primary osteoarthritis of hip: Secondary | ICD-10-CM | POA: Diagnosis not present

## 2024-03-17 DIAGNOSIS — E872 Acidosis, unspecified: Secondary | ICD-10-CM | POA: Diagnosis not present

## 2024-03-17 DIAGNOSIS — S32512A Fracture of superior rim of left pubis, initial encounter for closed fracture: Principal | ICD-10-CM | POA: Insufficient documentation

## 2024-03-17 DIAGNOSIS — M1711 Unilateral primary osteoarthritis, right knee: Secondary | ICD-10-CM | POA: Diagnosis not present

## 2024-03-17 DIAGNOSIS — M25551 Pain in right hip: Secondary | ICD-10-CM | POA: Diagnosis not present

## 2024-03-17 DIAGNOSIS — E876 Hypokalemia: Secondary | ICD-10-CM | POA: Diagnosis present

## 2024-03-17 DIAGNOSIS — M1712 Unilateral primary osteoarthritis, left knee: Secondary | ICD-10-CM | POA: Diagnosis not present

## 2024-03-17 DIAGNOSIS — S80919A Unspecified superficial injury of unspecified knee, initial encounter: Secondary | ICD-10-CM | POA: Diagnosis not present

## 2024-03-17 MED ORDER — FENTANYL CITRATE PF 50 MCG/ML IJ SOSY: 25.0000 ug | PREFILLED_SYRINGE | Freq: Once | INTRAMUSCULAR | Status: DC

## 2024-03-17 MED ORDER — HYDROCODONE-ACETAMINOPHEN 5-325 MG PO TABS
1.0000 | ORAL_TABLET | Freq: Once | ORAL | Status: AC
  Administered 2024-03-17: 1 via ORAL
  Filled 2024-03-17: qty 1

## 2024-03-17 NOTE — ED Notes (Signed)
 Family reports a mechanical fall tonight onto her left side. Hx of alzheimers. Daughter reports she wasn't able to get up and couldn't bear weight. Intermittently reports pain to left knee and right hip. No thinners. Didn't hit her head.

## 2024-03-17 NOTE — ED Notes (Signed)
 Will attempt to ambulate patient once the pain medication has time to kick in.

## 2024-03-17 NOTE — ED Triage Notes (Signed)
 Pt BIB [facility] from home after a fall. R knee and BIL hip pain. No thinners, No LOC. H/x dementia. 136/66 78

## 2024-03-17 NOTE — Discharge Instructions (Addendum)
 You were seen for your right hip pain after fall in the emergency department.   At home, please use Tylenol  for your pain.  You may also use over-the-counter lidocaine patches or topical pain medication like Voltaren  gel.   Check your MyChart online for the results of any tests that had not resulted by the time you left the emergency department.   Follow-up with your primary doctor in 2-3 days regarding your visit.    Return immediately to the emergency department if you experience any of the following: severe pain, or any other concerning symptoms.    Thank you for visiting our Emergency Department. It was a pleasure taking care of you today.

## 2024-03-17 NOTE — ED Provider Notes (Signed)
 [place] EMERGENCY DEPARTMENT AT [facility] Provider Note   CSN: [id number] Arrival date & time: 03/17/24  2052     History  Chief Complaint  Patient presents with   Darlene Ballard    Darlene Ballard is a 78 y.o. female.  78 year old female with a history of dementia and anxiety who presents emergency department with right-sided pain after a fall.  History obtained per patient's daughter.  Reports that she was walking around and tripped and fell onto her right side.  No head strike or LOC.  Has been complaining of right hip and knee pain and unable to bear weight since.  Not on blood thinners.  Lives at home with daughter and walks without assistance typically       Home Medications Prior to Admission medications   Medication Sig Start Date End Date Taking? Authorizing Provider  FLUoxetine  (PROZAC ) 20 MG capsule Take 1 capsule (20 mg total) by mouth daily. 02/18/24   Tysinger, Christiane Cowing, PA-C  levothyroxine  (SYNTHROID ) 88 MCG tablet Take 1 tablet (88 mcg total) by mouth daily. 03/14/24   Tysinger, Christiane Cowing, PA-C  MELATONIN GUMMIES PO Take 10 mg by mouth.    [provider]  polyethylene glycol (MIRALAX  / GLYCOLAX ) 17 g packet Take 17 g by mouth daily. Patient taking differently: Take 17 g by mouth daily as needed. 02/21/24   Samtani, Jai-Gurmukh, MD  QUEtiapine  (SEROQUEL ) 25 MG tablet Take one and half tablet at night (37.5 mg) , may increase  to 50 mg nightly if needed Patient taking differently: Take 50 mg by mouth at bedtime. Take one and half tablet at night (37.5 mg) , may increase  to 50 mg nightly if needed 02/18/24   Tysinger, Christiane Cowing, PA-C  senna (SENOKOT) 8.6 MG TABS tablet Take 1 tablet (8.6 mg total) by mouth at bedtime as needed for mild constipation. Patient not taking: Reported on 02/26/2024 02/21/24   Samtani, Jai-Gurmukh, MD      Allergies    Yellow jacket venom, Paxil [paroxetine], and Zoloft [sertraline]    Review of Systems   Review of Systems  Physical  Exam Updated Vital Signs BP (!) 149/73   Pulse (!) 57   Temp 98.6 F (37 C) (Oral)   Resp 17   Ht 5' (1.524 m)   Wt 50.3 kg   SpO2 100%   BMI 21.66 kg/m  Physical Exam Constitutional:      General: She is not in acute distress.    Appearance: Normal appearance. She is not ill-appearing.  HENT:     Head: Normocephalic and atraumatic.     Right Ear: External ear normal.     Left Ear: External ear normal.     Mouth/Throat:     Mouth: Mucous membranes are moist.     Pharynx: Oropharynx is clear.  Eyes:     Extraocular Movements: Extraocular movements intact.     Conjunctiva/sclera: Conjunctivae normal.     Pupils: Pupils are equal, round, and reactive to light.  Neck:     Comments: No C-spine midline tenderness to palpation Cardiovascular:     Rate and Rhythm: Normal rate and regular rhythm.     Pulses: Normal pulses.     Heart sounds: Normal heart sounds.  Pulmonary:     Effort: Pulmonary effort is normal. No respiratory distress.     Breath sounds: Normal breath sounds.  Musculoskeletal:        General: No deformity. Normal range of motion.  Cervical back: No rigidity or tenderness.     Comments: No tenderness to palpation of midline thoracic or lumbar spine.  No step-offs palpated.  No tenderness to palpation of chest wall.  No bruising noted.  No tenderness to palpation of bilateral clavicles.  No tenderness to palpation, bruising, or deformities noted of bilateral shoulders, elbows, wrists, or ankles.  Tenderness palpation of right hip and knee.  DP pulses 2+ bilaterally.  Intact sensation to light touch of the right lower extremity.  Able to wiggle toes of the right lower extremity.  Neurological:     General: No focal deficit present.     Mental Status: She is alert. Mental status is at baseline.     Cranial Nerves: No cranial nerve deficit.     Sensory: No sensory deficit.     Motor: No weakness.     ED Results / Procedures / Treatments   Labs (all labs  ordered are listed, but only abnormal results are displayed) Labs Reviewed - No data to display   EKG None  Radiology DG Knee Complete 4 Views Right Result Date: 03/17/2024 EXAM: 4 or more VIEW(S) XRAY OF THE RIGHT KNEE 03/17/2024 10:33:24 PM COMPARISON: None available. CLINICAL HISTORY: R hip pain. Encounter for right hip pain from fall. FINDINGS: BONES AND JOINTS: No acute fracture. No focal osseous lesion. No joint dislocation. No significant joint effusion. Moderate tricompartmental degenerative changes most prominent in the lateral compartment. SOFT TISSUES: The soft tissues are unremarkable. IMPRESSION: 1. No fracture or dislocation. 2. Moderate degenerative changes. Electronically signed by: Zadie Herter MD 03/17/2024 10:40 PM EDT RP Workstation: [id number]   DG Hip Unilat W or Wo Pelvis 2-3 Views Right Result Date: 03/17/2024 EXAM: 3 VIEW(S) XRAY OF THE RIGHT HIP 03/17/2024 10:31:25 PM COMPARISON: None available. CLINICAL HISTORY: R hip pain. Encounter for right hip pain, fall FINDINGS: BONES AND JOINTS: No acute fracture or focal osseous lesion. The hip joint is maintained. No significant degenerative changes. SOFT TISSUES: Calcifications overlying the right lower abdomen are presumably external to the patient when correlating with priors. Moderate rectal stool burden, suggesting mild fecal impaction. IMPRESSION: 1. No acute fracture or dislocation. 2. Moderate rectal stool burden, suggesting mild fecal impaction. Electronically signed by: Zadie Herter MD 03/17/2024 10:39 PM EDT RP Workstation: [id number]    Procedures Procedures    Medications Ordered in ED Medications  HYDROcodone -acetaminophen  (NORCO/VICODIN) 5-325 MG per tablet 1 tablet (1 tablet Oral Given 03/17/24 2314)    ED Course/ Medical Decision Making/ A&P                                 Medical Decision Making Amount and/or Complexity of Data Reviewed Labs: ordered. Radiology: ordered.  Risk Prescription  drug management.   Darlene Ballard is a 78 y.o. female with comorbidities that complicate the patient evaluation including dementia who presents to the emergency department with right sided lower extremity pain after a fall  Initial Ddx:  Mechanical fall, syncope, seizure, TBI, concussion, C-spine injury, hip fracture, knee fracture  MDM/Course:  Patient presents emergency department after mechanical fall.  No head injuries.  Complaining of hip pain and knee pain.  No obvious deformities on exam.  She is neurovascularly intact in her foot.  She had x-rays that did not show acute fracture of the hip or knee.  Was given pain medication and discharged home in the care of her daughter.  Will have her  follow-up with her primary doctor in several days.  This patient presents to the ED for concern of complaints listed in HPI, this involves an extensive number of treatment options, and is a complaint that carries with it a high risk of complications and morbidity. Disposition including potential need for admission considered.   Dispo: DC Home. Return precautions discussed including, but not limited to, those listed in the AVS. Allowed pt time to ask questions which were answered fully prior to dc.  Additional history obtained from daughter Records reviewed Outpatient Clinic Notes I independently reviewed the following imaging with scope of interpretation limited to determining acute life threatening conditions related to emergency care: Extremity x-ray(s) and agree with the radiologist interpretation with the following exceptions: none I have reviewed the patients home medications and made adjustments as needed Social Determinants of health:  Geriatric  Portions of this note were generated with Scientist, clinical (histocompatibility and immunogenetics). Dictation errors may occur despite best attempts at proofreading.     Final Clinical Impression(s) / ED Diagnoses Final diagnoses:  Fall, initial encounter  Right hip pain     Rx / DC Orders ED Discharge Orders     None         Ninetta Basket, MD 03/17/24 2341

## 2024-03-18 ENCOUNTER — Emergency Department (HOSPITAL_COMMUNITY)

## 2024-03-18 DIAGNOSIS — F0280 Dementia in other diseases classified elsewhere without behavioral disturbance: Secondary | ICD-10-CM | POA: Diagnosis not present

## 2024-03-18 DIAGNOSIS — M25562 Pain in left knee: Secondary | ICD-10-CM | POA: Diagnosis not present

## 2024-03-18 DIAGNOSIS — I10 Essential (primary) hypertension: Secondary | ICD-10-CM

## 2024-03-18 DIAGNOSIS — E872 Acidosis, unspecified: Secondary | ICD-10-CM

## 2024-03-18 DIAGNOSIS — W1830XA Fall on same level, unspecified, initial encounter: Secondary | ICD-10-CM | POA: Diagnosis not present

## 2024-03-18 DIAGNOSIS — E039 Hypothyroidism, unspecified: Secondary | ICD-10-CM | POA: Diagnosis not present

## 2024-03-18 DIAGNOSIS — F03918 Unspecified dementia, unspecified severity, with other behavioral disturbance: Secondary | ICD-10-CM | POA: Diagnosis not present

## 2024-03-18 DIAGNOSIS — E876 Hypokalemia: Secondary | ICD-10-CM | POA: Diagnosis not present

## 2024-03-18 DIAGNOSIS — Z66 Do not resuscitate: Secondary | ICD-10-CM | POA: Diagnosis not present

## 2024-03-18 DIAGNOSIS — M25551 Pain in right hip: Secondary | ICD-10-CM | POA: Diagnosis not present

## 2024-03-18 DIAGNOSIS — M25552 Pain in left hip: Secondary | ICD-10-CM | POA: Diagnosis not present

## 2024-03-18 DIAGNOSIS — F0390 Unspecified dementia without behavioral disturbance: Secondary | ICD-10-CM | POA: Diagnosis not present

## 2024-03-18 DIAGNOSIS — I6782 Cerebral ischemia: Secondary | ICD-10-CM | POA: Diagnosis not present

## 2024-03-18 DIAGNOSIS — S32591A Other specified fracture of right pubis, initial encounter for closed fracture: Secondary | ICD-10-CM | POA: Diagnosis not present

## 2024-03-18 DIAGNOSIS — M160 Bilateral primary osteoarthritis of hip: Secondary | ICD-10-CM | POA: Diagnosis not present

## 2024-03-18 DIAGNOSIS — M47812 Spondylosis without myelopathy or radiculopathy, cervical region: Secondary | ICD-10-CM | POA: Diagnosis not present

## 2024-03-18 DIAGNOSIS — M79652 Pain in left thigh: Secondary | ICD-10-CM | POA: Diagnosis not present

## 2024-03-18 DIAGNOSIS — M25462 Effusion, left knee: Secondary | ICD-10-CM | POA: Diagnosis not present

## 2024-03-18 DIAGNOSIS — M1712 Unilateral primary osteoarthritis, left knee: Secondary | ICD-10-CM | POA: Diagnosis not present

## 2024-03-18 LAB — CBC WITH DIFFERENTIAL/PLATELET
Abs Immature Granulocytes: 0.05 10*3/uL (ref 0.00–0.07)
Basophils Absolute: 0 10*3/uL (ref 0.0–0.1)
Basophils Relative: 0 %
Eosinophils Absolute: 0.1 10*3/uL (ref 0.0–0.5)
Eosinophils Relative: 1 %
HCT: 36.4 % (ref 36.0–46.0)
Hemoglobin: 12.8 g/dL (ref 12.0–15.0)
Immature Granulocytes: 1 %
Lymphocytes Relative: 11 %
Lymphs Abs: 1.1 10*3/uL (ref 0.7–4.0)
MCH: 32.4 pg (ref 26.0–34.0)
MCHC: 35.2 g/dL (ref 30.0–36.0)
MCV: 92.2 fL (ref 80.0–100.0)
Monocytes Absolute: 0.9 10*3/uL (ref 0.1–1.0)
Monocytes Relative: 9 %
Neutro Abs: 7.7 10*3/uL (ref 1.7–7.7)
Neutrophils Relative %: 78 %
Platelets: 129 10*3/uL — ABNORMAL LOW (ref 150–400)
RBC: 3.95 MIL/uL (ref 3.87–5.11)
RDW: 13.1 % (ref 11.5–15.5)
WBC: 9.8 10*3/uL (ref 4.0–10.5)
nRBC: 0 % (ref 0.0–0.2)

## 2024-03-18 LAB — BASIC METABOLIC PANEL WITH GFR
Anion gap: 15 (ref 5–15)
BUN: 18 mg/dL (ref 8–23)
CO2: 19 mmol/L — ABNORMAL LOW (ref 22–32)
Calcium: 8.5 mg/dL — ABNORMAL LOW (ref 8.9–10.3)
Chloride: 105 mmol/L (ref 98–111)
Creatinine, Ser: 0.57 mg/dL (ref 0.44–1.00)
GFR, Estimated: >60 mL/min (ref >60)
Glucose, Bld: 108 mg/dL — ABNORMAL HIGH (ref 70–99)
Potassium: 3 mmol/L — ABNORMAL LOW (ref 3.5–5.1)
Sodium: 139 mmol/L (ref 135–145)

## 2024-03-18 LAB — LACTIC ACID, PLASMA: Lactic Acid, Venous: 1.3 mmol/L (ref 0.5–1.9)

## 2024-03-18 MED ORDER — QUETIAPINE FUMARATE 25 MG PO TABS
37.5000 mg | ORAL_TABLET | Freq: Every day | ORAL | Status: DC
  Administered 2024-03-18 – 2024-03-21 (×4): 37.5 mg via ORAL
  Filled 2024-03-18 (×4): qty 2

## 2024-03-18 MED ORDER — OXYCODONE HCL 5 MG PO TABS: 2.5000 mg | ORAL_TABLET | ORAL | Status: DC | PRN

## 2024-03-18 MED ORDER — POTASSIUM CHLORIDE CRYS ER 20 MEQ PO TBCR
40.0000 meq | EXTENDED_RELEASE_TABLET | Freq: Once | ORAL | Status: AC
  Administered 2024-03-18: 40 meq via ORAL
  Filled 2024-03-18: qty 2

## 2024-03-18 MED ORDER — POLYETHYLENE GLYCOL 3350 17 G PO PACK
17.0000 g | PACK | ORAL | Status: DC
  Administered 2024-03-18 – 2024-03-21 (×2): 17 g via ORAL
  Filled 2024-03-18 (×2): qty 1

## 2024-03-18 MED ORDER — ENOXAPARIN SODIUM 40 MG/0.4ML IJ SOSY
40.0000 mg | PREFILLED_SYRINGE | INTRAMUSCULAR | Status: DC
  Administered 2024-03-18 – 2024-03-22 (×5): 40 mg via SUBCUTANEOUS
  Filled 2024-03-18 (×5): qty 0.4

## 2024-03-18 MED ORDER — QUETIAPINE FUMARATE 25 MG PO TABS: 50.0000 mg | ORAL_TABLET | Freq: Every day | ORAL | Status: DC

## 2024-03-18 MED ORDER — SENNA 8.6 MG PO TABS: 1.0000 | ORAL_TABLET | Freq: Every day | ORAL | Status: DC

## 2024-03-18 MED ORDER — ALBUTEROL SULFATE (2.5 MG/3ML) 0.083% IN NEBU: 2.5000 mg | INHALATION_SOLUTION | RESPIRATORY_TRACT | Status: DC | PRN

## 2024-03-18 MED ORDER — METHOCARBAMOL 500 MG PO TABS
500.0000 mg | ORAL_TABLET | Freq: Three times a day (TID) | ORAL | Status: DC | PRN
  Administered 2024-03-18 – 2024-03-22 (×4): 500 mg via ORAL
  Filled 2024-03-18 (×4): qty 1

## 2024-03-18 MED ORDER — SENNA 8.6 MG PO TABS: 1.0000 | ORAL_TABLET | Freq: Every evening | ORAL | Status: DC | PRN

## 2024-03-18 MED ORDER — POLYETHYLENE GLYCOL 3350 17 G PO PACK: 17.0000 g | PACK | Freq: Every day | ORAL | Status: DC | PRN

## 2024-03-18 MED ORDER — QUETIAPINE FUMARATE 25 MG PO TABS: 37.5000 mg | ORAL_TABLET | Freq: Every day | ORAL | Status: DC

## 2024-03-18 MED ORDER — HYDROMORPHONE HCL 1 MG/ML IJ SOLN
0.5000 mg | INTRAMUSCULAR | Status: DC | PRN
  Administered 2024-03-18: 0.5 mg via INTRAVENOUS
  Filled 2024-03-18: qty 0.5

## 2024-03-18 MED ORDER — MELATONIN 3 MG PO TABS
6.0000 mg | ORAL_TABLET | Freq: Every day | ORAL | Status: DC
  Administered 2024-03-18 – 2024-03-21 (×4): 6 mg via ORAL
  Filled 2024-03-18 (×4): qty 2

## 2024-03-18 MED ORDER — ACETAMINOPHEN 500 MG PO TABS: 1000.0000 mg | ORAL_TABLET | Freq: Four times a day (QID) | ORAL | Status: DC | PRN

## 2024-03-18 MED ORDER — LEVOTHYROXINE SODIUM 88 MCG PO TABS
88.0000 ug | ORAL_TABLET | Freq: Every day | ORAL | Status: DC
  Administered 2024-03-18 – 2024-03-22 (×5): 88 ug via ORAL
  Filled 2024-03-18 (×5): qty 1

## 2024-03-18 MED ORDER — POTASSIUM CHLORIDE 20 MEQ PO PACK
40.0000 meq | PACK | Freq: Once | ORAL | Status: AC
  Administered 2024-03-18: 40 meq via ORAL
  Filled 2024-03-18: qty 2

## 2024-03-18 MED ORDER — FLUOXETINE HCL 20 MG PO CAPS
20.0000 mg | ORAL_CAPSULE | Freq: Every day | ORAL | Status: DC
  Administered 2024-03-18 – 2024-03-22 (×5): 20 mg via ORAL
  Filled 2024-03-18 (×5): qty 1

## 2024-03-18 MED ORDER — OXYCODONE HCL 5 MG PO TABS
5.0000 mg | ORAL_TABLET | ORAL | Status: DC | PRN
  Administered 2024-03-20 – 2024-03-22 (×3): 5 mg via ORAL
  Filled 2024-03-18 (×3): qty 1

## 2024-03-18 MED ORDER — ACETAMINOPHEN 500 MG PO TABS
1000.0000 mg | ORAL_TABLET | Freq: Four times a day (QID) | ORAL | Status: DC
  Administered 2024-03-18 – 2024-03-22 (×12): 1000 mg via ORAL
  Filled 2024-03-18 (×13): qty 2

## 2024-03-18 MED ORDER — LACTATED RINGERS IV BOLUS
1000.0000 mL | Freq: Once | INTRAVENOUS | Status: AC
  Administered 2024-03-18: 1000 mL via INTRAVENOUS

## 2024-03-18 NOTE — Assessment & Plan Note (Addendum)
 03-18-2024 stable. Start scheduled tylenol . Pt is WBAT. No acute medical indications for inpatient admission.  03-19-2024 still no indications for inpatient admission. Family wanting SNF. CM looking for placement.  03-20-2024 CM looking for SNF placement.  03-21-2024 still awaiting for SNF placement  03-22-2024 still awaiting for SNF placement

## 2024-03-18 NOTE — Progress Notes (Signed)
   03/18/24 1200  Orthostatic Lying   BP- Lying 132/69  Pulse- Lying 63  Orthostatic Sitting  BP- Sitting (!) 127/93  Pulse- Sitting 66  Orthostatic Standing at 0 minutes  BP- Standing at 0 minutes 130/76  Pulse- Standing at 0 minutes 78  Orthostatic Standing at 3 minutes  BP- Standing at 3 minutes 130/81  Pulse- Standing at 3 minutes 77

## 2024-03-18 NOTE — Assessment & Plan Note (Addendum)
 03-18-2024 replete with po kcl.  03-19-2024 resolved. K of 3.9 today.

## 2024-03-18 NOTE — Progress Notes (Signed)
 PROGRESS NOTE    Darlene Ballard  QMV:[id number] DOB: [birth year] DOA: 03/17/2024 PCP: Claudene Crystal, PA-C  Subjective: Pt seen and examined. Family wants pt to go to SNF at discharge.   Hospital Course: HPI: Darlene Ballard is a 78 y.o. female with hx of Alzheimer's dementia, hypothyroidism, anxiety, recurrent UTIs, recent admission with stercoral colitis, was brought in from home after a ground-level fall.  History is provided by patient's daughter at the bedside.  Reports that she had a witnessed fall in which she was walking around the [place] in the kitchen and seem to lose her footing and fall on her right side.  Having pain in the right hip with attempted ambulation.  No other injuries noted.  Otherwise recently eating and drinking well, no other recent illness.   Significant Events: Admitted 03/17/2024 for left superior pubic ramus fracture   Admission Labs: WBC 9.8, HgB 12.8, plt 129 Na 139, K 3.0, CO2 of 19, BUN 18, Scr 0.57, glu 108  Admission Imaging Studies: XR pelvis No acute fracture or dislocation. 2. Moderate rectal stool burden, suggesting mild fecal impaction Right Knee XR No fracture or dislocation. 2. Moderate degenerative changes Left femur XR No fracture or dislocation. Mild degenerative change  CT pelvis Nondisplaced left parasymphyseal / superior pubic ramus fracture. 2. Bilateral hips are intact CT head No acute intracranial abnormality. 2. Global cortical atrophy and small vessel ischemic changes CT c-spine No traumatic injury to the cervical spine   Significant Labs:   Significant Imaging Studies:   Antibiotic Therapy: Anti-infectives (From admission, onward)    None       Procedures:   Consultants:     Assessment and Plan: * Pubic ramus fracture, right, closed, initial encounter (HCC) 03-18-2024 stable. Start scheduled tylenol . Pt is WBAT. No acute medical indications for inpatient admission.  Ground-level fall 03-18-2024 stable.  Family wants to place patient into SNF. PT/OT consults ordered  DNR (do not resuscitate) Pt was made DNR/DNI on admission.  Acquired hypothyroidism 03-18-2024 continue synthroid  88 mcg daily.  Dementia with behavioral disturbance (HCC) 03-18-2024 chronic. On seroquel  and prozac   Essential hypertension 03-18-2024 stable. Not on any HTN meds.  Metabolic acidosis 03-18-2024 repeat BMP in AM.  Hypokalemia 03-18-2024 replete with po kcl.   DVT prophylaxis: enoxaparin  (LOVENOX ) injection 40 mg Start: 03/18/24 1000    Code Status: Limited: Do not attempt resuscitation (DNR) -DNR-LIMITED -Do Not Intubate/DNI  Family Communication: no family at bedside Disposition Plan: SNF Reason for continuing need for hospitalization: medically stable for DC. Family wants SNF placement. No acute medical indication for hospital admission.  Objective: Vitals:   03/18/24 0529 03/18/24 0808 03/18/24 1132 03/18/24 1619  BP: (!) 144/78 137/75 123/80 138/61  Pulse: 65 68 64 60  Resp:  19 18 18   Temp: (!) 97.5 F (36.4 C) 98.4 F (36.9 C) 97.7 F (36.5 C) 98.3 F (36.8 C)  TempSrc: Oral Oral Oral Oral  SpO2: 100% 94% 100% 99%  Weight:      Height:        Intake/Output Summary (Last 24 hours) at 03/18/2024 1731 Last data filed at 03/18/2024 1727 Gross per 24 hour  Intake 360 ml  Output 950 ml  Net -590 ml   Filed Weights   03/17/24 2054  Weight: 50.3 kg    Examination:  Physical Exam Vitals and nursing note reviewed.  Constitutional:      General: She is not in acute distress.    Appearance: She is not  toxic-appearing.     Comments: Awake. Pleasantly demented, thin and frail.  HENT:     Head: Normocephalic and atraumatic.  Eyes:     General: No scleral icterus. Cardiovascular:     Rate and Rhythm: Normal rate and regular rhythm.  Pulmonary:     Effort: Pulmonary effort is normal.     Breath sounds: Normal breath sounds.  Abdominal:     General: Abdomen is flat. Bowel sounds are  normal. There is no distension.     Palpations: Abdomen is soft.     Tenderness: There is no abdominal tenderness.  Musculoskeletal:     Right lower leg: No edema.     Left lower leg: No edema.  Skin:    General: Skin is warm and dry.     Capillary Refill: Capillary refill takes less than 2 seconds.  Neurological:     General: No focal deficit present.     Mental Status: She is disoriented.     Data Reviewed: I have personally reviewed following labs and imaging studies  CBC: Recent Labs  Lab 03/18/24 0256  WBC 9.8  NEUTROABS 7.7  HGB 12.8  HCT 36.4  MCV 92.2  PLT 129*   Basic Metabolic Panel: Recent Labs  Lab 03/18/24 0256  NA 139  K 3.0*  CL 105  CO2 19*  GLUCOSE 108*  BUN 18  CREATININE 0.57  CALCIUM 8.5*   GFR: Estimated Creatinine Clearance: 43 mL/min (by C-G formula based on SCr of 0.57 mg/dL).  Sepsis Labs: Recent Labs  Lab 03/18/24 0424  LATICACIDVEN 1.3    Radiology Studies: CT Cervical Spine Wo Contrast Result Date: 03/18/2024 EXAM: CT CERVICAL SPINE WITHOUT CONTRAST 03/18/2024 03:21:52 AM TECHNIQUE: CT of the cervical was performed without the administration of intravenous contrast. Multiplanar reformatted images are provided for review. Automated exposure control, iterative reconstruction, and/or weight based adjustment of the mA/kV was utilized to reduce the radiation dose to as low as reasonably achievable. COMPARISON: None available. CLINICAL HISTORY: Neck trauma (Age >= 65y). Triage notes; Pt BIB [facility] from home after a fall. R knee and BIL hip pain. No thinners, No LOC. H/x dementia. FINDINGS: CERVICAL SPINE: BONES/ALIGNMENT: There is no acute fracture or traumatic malalignment. DEGENERATIVE CHANGES: Mild degenerative changes at C5-6 and C6-7. SOFT TISSUES: There is no prevertebral soft tissue swelling. IMPRESSION: 1. No traumatic injury to the cervical spine. Electronically signed by: Zadie Herter MD 03/18/2024 03:27 AM EDT RP Workstation:  [id number]   CT Head Wo Contrast Result Date: 03/18/2024 EXAM: CT HEAD WITHOUT CONTRAST 03/18/2024 03:21:52 AM TECHNIQUE: CT of the head was performed without the administration of intravenous contrast. Automated exposure control, iterative reconstruction, and/or weight based adjustment of the mA/kV was utilized to reduce the radiation dose to as low as reasonably achievable. COMPARISON: 04/15/2023 CLINICAL HISTORY: Head trauma, moderate-severe. Triage notes; Pt BIB [facility] from home after a fall. R knee and BIL hip pain. No thinners, No LOC. H/x dementia. FINDINGS: BRAIN AND VENTRICLES: There is no acute intracranial hemorrhage, mass effect or midline shift. No abnormal extra-axial fluid collection. The gray-white differentiation is maintained without an acute infarct. There is no hydrocephalus. Global cortical atrophy. Subcortical and periventricular small vessel ischemic changes. ORBITS: The visualized portion of the orbits demonstrate no acute abnormality. SINUSES: The visualized paranasal sinuses and mastoid air cells demonstrate no acute abnormality. SOFT TISSUES AND SKULL: No acute abnormality of the visualized skull or soft tissues. IMPRESSION: 1. No acute intracranial abnormality. 2. Global cortical atrophy and small  vessel ischemic changes. Electronically signed by: Zadie Herter MD 03/18/2024 03:26 AM EDT RP Workstation: [id number]   CT PELVIS WO CONTRAST Result Date: 03/18/2024 EXAM: CT Pelvis, WITHOUT IV CONTRAST 03/18/2024 03:21:52 AM TECHNIQUE: Axial images were acquired through the pelvis without IV contrast. Reformatted images were reviewed. Automated exposure control, iterative reconstruction, and/or weight based adjustment of the mA/kV was utilized to reduce the radiation dose to as low as reasonably achievable. COMPARISON: 02/19/2024 CLINICAL HISTORY: Hip trauma, fracture suspected, xray done. Triage notes; Pt BIB [facility] from home after a fall. R knee and BIL hip pain. No thinners, No LOC.  H/x dementia. FINDINGS: BONES: Nondisplaced left parasymphyseal/superior pubic ramus fracture (coronal image 82). Bilateral hips are intact. JOINTS: No dislocation. The joint spaces are normal. SOFT TISSUES: The soft tissues are unremarkable. INTRAPELVIC CONTENTS: Thick-walled bladder, although underdistended. Moderate colonic stool burden. IMPRESSION: 1. Nondisplaced left parasymphyseal / superior pubic ramus fracture. 2. Bilateral hips are intact. Electronically signed by: Zadie Herter MD 03/18/2024 03:26 AM EDT RP Workstation: [id number]   DG Knee Complete 4 Views Left Result Date: 03/18/2024 CLINICAL DATA:  Fall, pain EXAM: LEFT KNEE - COMPLETE 4+ VIEW COMPARISON:  None Available. FINDINGS: Normal alignment. No acute fracture or dislocation. Mild to moderate tricompartmental degenerative arthritis, most severe within the lateral compartment. Trace effusion. Soft tissues are otherwise unremarkable. IMPRESSION: 1. Mild to moderate tricompartmental degenerative arthritis. Electronically Signed   By: Worthy Heads M.D.   On: 03/18/2024 02:46   DG Femur Min 2 Views Left Result Date: 03/18/2024 CLINICAL DATA:  Fall, left hip pain EXAM: LEFT FEMUR 2 VIEWS COMPARISON:  None Available. FINDINGS: There is no evidence of fracture or other focal bone lesions. Mild tricompartmental left knee degenerative arthritis. Mild left hip degenerative arthritis. Soft tissues are unremarkable. IMPRESSION: 1. No fracture or dislocation. Mild degenerative change. Electronically Signed   By: Worthy Heads M.D.   On: 03/18/2024 02:45   DG Hip Unilat W or Wo Pelvis 2-3 Views Left Result Date: 03/18/2024 CLINICAL DATA:  Fall, hip pain EXAM: DG HIP (WITH OR WITHOUT PELVIS) 2-3V LEFT COMPARISON:  None Available. FINDINGS: Normal alignment. No acute fracture or dislocation. Mild bilateral degenerative hip arthritis. Soft tissues are unremarkable. IMPRESSION: 1. Mild bilateral degenerative hip arthritis. Electronically Signed   By:  Worthy Heads M.D.   On: 03/18/2024 02:44   DG Knee Complete 4 Views Right Result Date: 03/17/2024 EXAM: 4 or more VIEW(S) XRAY OF THE RIGHT KNEE 03/17/2024 10:33:24 PM COMPARISON: None available. CLINICAL HISTORY: R hip pain. Encounter for right hip pain from fall. FINDINGS: BONES AND JOINTS: No acute fracture. No focal osseous lesion. No joint dislocation. No significant joint effusion. Moderate tricompartmental degenerative changes most prominent in the lateral compartment. SOFT TISSUES: The soft tissues are unremarkable. IMPRESSION: 1. No fracture or dislocation. 2. Moderate degenerative changes. Electronically signed by: Zadie Herter MD 03/17/2024 10:40 PM EDT RP Workstation: [id number]   DG Hip Unilat W or Wo Pelvis 2-3 Views Right Result Date: 03/17/2024 EXAM: 3 VIEW(S) XRAY OF THE RIGHT HIP 03/17/2024 10:31:25 PM COMPARISON: None available. CLINICAL HISTORY: R hip pain. Encounter for right hip pain, fall FINDINGS: BONES AND JOINTS: No acute fracture or focal osseous lesion. The hip joint is maintained. No significant degenerative changes. SOFT TISSUES: Calcifications overlying the right lower abdomen are presumably external to the patient when correlating with priors. Moderate rectal stool burden, suggesting mild fecal impaction. IMPRESSION: 1. No acute fracture or dislocation. 2. Moderate rectal stool burden, suggesting mild fecal impaction.  Electronically signed by: Zadie Herter MD 03/17/2024 10:39 PM EDT RP Workstation: [id number]    Scheduled Meds:  acetaminophen   1,000 mg Oral QID   enoxaparin  (LOVENOX ) injection  40 mg Subcutaneous Q24H   FLUoxetine   20 mg Oral Daily   levothyroxine   88 mcg Oral Daily   melatonin  6 mg Oral QHS   polyethylene glycol  17 g Oral Once per day on Monday Wednesday Friday   QUEtiapine   37.5 mg Oral QHS   Continuous Infusions:   LOS: 0 days   Time spent: 45 minutes  Unk Garb, DO  [facility]  03/18/2024, 5:31 PM

## 2024-03-18 NOTE — Hospital Course (Signed)
 HPI: Darlene Ballard is a 78 y.o. female with hx of Alzheimer's dementia, hypothyroidism, anxiety, recurrent UTIs, recent admission with stercoral colitis, was brought in from home after a ground-level fall.  History is provided by patient's daughter at the bedside.  Reports that she had a witnessed fall in which she was walking around [place] in the kitchen and seem to lose her footing and fall on her right side.  Having pain in the right hip with attempted ambulation.  No other injuries noted.  Otherwise recently eating and drinking well, no other recent illness.   Significant Events: Admitted 03/17/2024 for left superior pubic ramus fracture   Admission Labs: WBC 9.8, HgB 12.8, plt 129 Na 139, K 3.0, CO2 of 19, BUN 18, Scr 0.57, glu 108  Admission Imaging Studies: XR pelvis No acute fracture or dislocation. 2. Moderate rectal stool burden, suggesting mild fecal impaction Right Knee XR No fracture or dislocation. 2. Moderate degenerative changes Left femur XR No fracture or dislocation. Mild degenerative change  CT pelvis Nondisplaced left parasymphyseal / superior pubic ramus fracture. 2. Bilateral hips are intact CT head No acute intracranial abnormality. 2. Global cortical atrophy and small vessel ischemic changes CT c-spine No traumatic injury to the cervical spine   Significant Labs:   Significant Imaging Studies:   Antibiotic Therapy: Anti-infectives (From admission, onward)    None       Procedures:   Consultants:

## 2024-03-18 NOTE — Assessment & Plan Note (Addendum)
 Pt was made DNR/DNI on admission.

## 2024-03-18 NOTE — Assessment & Plan Note (Addendum)
 03-18-2024 stable. Not on any HTN meds.  03-19-2024 stable.  03-20-2024 stable. Not on any HTN meds.  03-21-2024 stable. Would not start HTN meds until SBP in the 160 range to prevent any chance of orthostasis.  03-22-2024 start low dose norasc 2.5 mg daily.

## 2024-03-18 NOTE — NC FL2 (Signed)
 [place]  MEDICAID FL2 LEVEL OF CARE FORM     IDENTIFICATION  Patient Name: Darlene Ballard Birthdate: 09/20/1946 Sex: female Admission Date (Current Location): 03/17/2024  [facility] and [place] Number:  Producer, television/film/video and Address:  [facility], [name], [place], [place] [address]      Provider Number: [id number]  Attending Physician Name and Address:  Unk Garb, DO  Relative Name and Phone Number:  George Kinder Daughter [phone]    Current Level of Care: Hospital Recommended Level of Care: Skilled Nursing Facility Prior Approval Number:    Date Approved/Denied:   PASRR Number:    Discharge Plan: SNF    Current Diagnoses: Patient Active Problem List   Diagnosis Date Noted   Pubic ramus fracture, right, closed, initial encounter (HCC) 03/18/2024   Ground-level fall 03/18/2024   Hypokalemia 02/19/2024   Stercoral colitis 02/19/2024   Insomnia 02/18/2024   Dependent edema 02/18/2024   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   Behavior disturbance 07/02/2023   Sleep disturbance 07/02/2023   Abnormal brain MRI 04/27/2023   Self-care deficit for bathing and hygiene 04/27/2023   Neglected elder 04/27/2023   Encounter for health maintenance examination in adult 06/10/2022   Advance directive discussed with patient 06/10/2022   Vaccine counseling 06/10/2022   Medicare annual wellness visit, subsequent 06/10/2022   Hypothyroidism 06/26/2021   Chronic pain of both knees 02/08/2020   Dementia with behavioral disturbance (HCC) 08/09/2019   Hyperlipidemia 02/01/2019   Anxiety 08/03/2018   Hypertension 08/03/2018    Orientation RESPIRATION BLADDER Height & Weight     Self  Normal External catheter, Incontinent Weight: 110 lb 14.3 oz (50.3 kg) Height:  5' (152.4 cm)  BEHAVIORAL SYMPTOMS/MOOD NEUROLOGICAL BOWEL NUTRITION STATUS      Continent Diet (see discharge summary)  AMBULATORY STATUS COMMUNICATION OF NEEDS Skin   Limited Assist Verbally  Normal                       Personal Care Assistance Level of Assistance  Bathing, Feeding, Dressing Bathing Assistance: Limited assistance Feeding assistance: Limited assistance Dressing Assistance: Limited assistance     Functional Limitations Info  Sight, Hearing, Speech Sight Info: Adequate Hearing Info: Impaired Speech Info: Adequate    SPECIAL CARE FACTORS FREQUENCY  PT (By licensed PT), OT (By licensed OT)     PT Frequency: 5x week OT Frequency: 5x week            Contractures Contractures Info: Not present    Additional Factors Info  Code Status, Allergies Code Status Info: DNR Allergies Info: [place] Venom, Paxil (Paroxetine), Zoloft (Sertraline)           Current Medications (03/18/2024):  This is the current hospital active medication list Current Facility-Administered Medications  Medication Dose Route Frequency Provider Last Rate Last Admin   acetaminophen  (TYLENOL ) tablet 1,000 mg  1,000 mg Oral Q6H PRN Segars, Arlyce Lambert, MD       albuterol (PROVENTIL) (2.5 MG/3ML) 0.083% nebulizer solution 2.5 mg  2.5 mg Nebulization Q4H PRN Segars, Arlyce Lambert, MD       enoxaparin  (LOVENOX ) injection 40 mg  40 mg Subcutaneous Q24H Segars, Jonathan, MD   40 mg at 03/18/24 5643   FLUoxetine  (PROZAC ) capsule 20 mg  20 mg Oral Daily Rancour, Stephen, MD   20 mg at 03/18/24 0948   HYDROmorphone (DILAUDID) injection 0.5 mg  0.5 mg Intravenous Q4H PRN Segars, Jonathan, MD   0.5 mg at 03/18/24 [phone]  levothyroxine  (SYNTHROID ) tablet 88 mcg  88 mcg Oral Daily Rancour, Mara [place], MD   88 mcg at 03/18/24 0556   melatonin tablet 6 mg  6 mg Oral QHS Segars, Arlyce Lambert, MD       methocarbamol (ROBAXIN) tablet 500 mg  500 mg Oral Q8H PRN Segars, Jonathan, MD       oxyCODONE  (Oxy IR/ROXICODONE ) immediate release tablet 2.5 mg  2.5 mg Oral Q4H PRN Arnulfo Larch, MD       Or   oxyCODONE  (Oxy IR/ROXICODONE ) immediate release tablet 5 mg  5 mg Oral Q4H PRN Segars, Arlyce Lambert, MD        polyethylene glycol (MIRALAX  / GLYCOLAX ) packet 17 g  17 g Oral Once per day on Monday Wednesday Friday Arnulfo Larch, MD   17 g at 03/18/24 9629   QUEtiapine  (SEROQUEL ) tablet 37.5 mg  37.5 mg Oral QHS Segars, Jonathan, MD   37.5 mg at 03/18/24 5284     Discharge Medications: Please see discharge summary for a list of discharge medications.  Relevant Imaging Results:  Relevant Lab Results:   Additional Information SSN: [id number]  Elspeth Hals, LCSW

## 2024-03-18 NOTE — Care Management Obs Status (Signed)
 MEDICARE OBSERVATION STATUS NOTIFICATION   Patient Details  Name: Darlene Ballard MRN: [id number] Date of Birth: Jan 15, 1946   Medicare Observation Status Notification Given:       Elspeth Hals, LCSW 03/18/2024, 3:30 PM

## 2024-03-18 NOTE — TOC Initial Note (Addendum)
 Transition of Care [facility]) - Initial/Assessment Note    Patient Details  Name: Darlene Ballard MRN: [id number] Date of Birth: 12/28/1945  [facility]) CM/SW Contact:    Elspeth Hals, LCSW Phone Number: 03/18/2024, 12:26 PM  Clinical Narrative:  Pt oriented x1, all information from daughter Darlene Ballard by phone.    Pt from home with Darlene Ballard, no current home services but pt does attend an adult day care program.  Darlene Ballard is in agreement with plan for SNF, asking for locked unit at SNF, discussed reduced number of options that have this.  Medicare choice document and locked unit SNF  list placed in pt room, daughter will be back at the hospital later today to review.    Referral sent out in hub for SNF. Passr requires additional information.         1515: CSW reached out to following SNF with locked units for STR: [facility]: don't have locked unit. [facility]: locked unit full [facility]: locked unit full. [place]: [facility]-  does offer bed on locked unit  [facility]-no locked unit [facility]     Expected Discharge Plan: Skilled Nursing Facility Barriers to Discharge: SNF Pending bed offer   Patient Goals and CMS Choice   CMS Medicare.gov Compare Post Acute Care list provided to:: Patient Represenative (must comment) (daughter Darlene Ballard) Choice offered to / list presented to : Adult Children      Expected Discharge Plan and Services In-house Referral: Clinical Social Work   Post Acute Care Choice: Skilled Nursing Facility Living arrangements for the past 2 months: Single Family Home                                      Prior Living Arrangements/Services Living arrangements for the past 2 months: Single Family Home Lives with:: Adult Children (daughter Darlene Ballard) Patient language and need for interpreter reviewed:: Yes        Need for Family Participation in Patient Care: Yes (Comment) Care giver support system in place?: Yes  (comment) Current home services: Other (comment) (adult daycare) Criminal Activity/Legal Involvement Pertinent to Current Situation/Hospitalization: No - Comment as needed  Activities of Daily Living   ADL Screening (condition at time of admission) Independently performs ADLs?: Yes (appropriate for developmental age) Is the patient deaf or have difficulty hearing?: Yes Does the patient have difficulty seeing, even when wearing glasses/contacts?: Yes Does the patient have difficulty concentrating, remembering, or making decisions?: Yes  Permission Sought/Granted                  Emotional Assessment Appearance:: Appears stated age Attitude/Demeanor/Rapport: Unable to Assess Affect (typically observed): Unable to Assess Orientation: : Oriented to Self      Admission diagnosis:  Right hip pain [M25.551] Fall, initial encounter [W19.XXXA] Pubic ramus fracture, right, closed, initial encounter [facility]) [S32.591A] Patient Active Problem List   Diagnosis Date Noted   Pubic ramus fracture, right, closed, initial encounter (HCC) 03/18/2024   Ground-level fall 03/18/2024   Hypokalemia 02/19/2024   Stercoral colitis 02/19/2024   Insomnia 02/18/2024   Dependent edema 02/18/2024   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   Behavior disturbance 07/02/2023   Sleep disturbance 07/02/2023   Abnormal brain MRI 04/27/2023   Self-care deficit for bathing and hygiene 04/27/2023   Neglected elder 04/27/2023   Encounter for health maintenance examination in adult 06/10/2022   Advance directive discussed with patient 06/10/2022  Vaccine counseling 06/10/2022   Medicare annual wellness visit, subsequent 06/10/2022   Hypothyroidism 06/26/2021   Chronic pain of both knees 02/08/2020   Dementia with behavioral disturbance (HCC) 08/09/2019   Hyperlipidemia 02/01/2019   Anxiety 08/03/2018   Hypertension 08/03/2018   PCP:  [facility]:   [facility] - [place], [place] - [address] [place] [address] Phone: [phone] Fax: [phone]  [facility] - [place], [place] - [address] [place] [address] Phone: [phone] Fax: [phone]     Social Drivers of Health (SDOH) Social History: SDOH Screenings   Food Insecurity: No Food Insecurity (03/18/2024)  Housing: Low Risk  (03/18/2024)  Transportation Needs: No Transportation Needs (03/18/2024)  Utilities: Not At Risk (03/18/2024)  Alcohol Screen: Low Risk  (05/19/2023)  Depression (PHQ2-9): Low Risk  (05/19/2023)  Financial Resource Strain: Low Risk  (05/19/2023)  Physical Activity: Sufficiently Active (05/19/2023)  Social Connections: Moderately Integrated (03/18/2024)  Stress: No Stress Concern Present (05/19/2023)  Tobacco Use: Low Risk  (03/17/2024)  Health Literacy: Adequate Health Literacy (05/19/2023)   SDOH Interventions:     Readmission Risk Interventions    09/15/2023    3:03 PM  Readmission Risk Prevention Plan  Transportation Screening Complete  PCP or Specialist Appt within 5-7 Days Complete  Home Care Screening Complete  Medication Review (RN CM) Complete

## 2024-03-18 NOTE — Evaluation (Signed)
 Physical Therapy Evaluation Patient Details Name: Darlene Ballard MRN: [id number] DOB: 30-Jun-1946 Today's Date: 03/18/2024  History of Present Illness  Pt is a 78 y.o. female admitted 6/5 for fall in home. Imaging showed nondisplaced L superior pubic ramus fx. PMH: dementia, hypothyroidism, anxiety, recurrent UTIs, recent mission with stercoral colitis  Clinical Impression  Pt presents with pelvic and RLE pain, impaired cognition with history of dementia, max difficulty sequencing mobility tasks, antalgic gait, and decreased activity tolerance vs anticipated baseline. Pt to benefit from acute PT to address deficits. Pt ambulated short room distance with use of RW and light physical assist, pt requiring max sequencing cues throughout mobility. Patient will benefit from continued inpatient follow up therapy, <3 hours/day. PT to progress mobility as tolerated, and will continue to follow acutely.          If plan is discharge home, recommend the following: A little help with walking and/or transfers;A little help with bathing/dressing/bathroom   Can travel by private vehicle   Yes    Equipment Recommendations None recommended by PT  Recommendations for Other Services       Functional Status Assessment Patient has had a recent decline in their functional status and demonstrates the ability to make significant improvements in function in a reasonable and predictable amount of time.     Precautions / Restrictions Precautions Precautions: Fall Restrictions Weight Bearing Restrictions Per Provider Order: Yes LLE Weight Bearing Per Provider Order: Weight bearing as tolerated Other Position/Activity Restrictions: confirmed via secure chat with Arnulfo Larch, MD 6/6      Mobility  Bed Mobility Overal bed mobility: Needs Assistance Bed Mobility: Supine to Sit     Supine to sit: Min assist, HOB elevated     General bed mobility comments: Cues to sequence and initiate LEs towards EOB, pt  able to scoot self to EOB once sitting with max cues.    Transfers Overall transfer level: Needs assistance Equipment used: Rolling walker (2 wheels) Transfers: Sit to/from Stand, Bed to chair/wheelchair/BSC Sit to Stand: Min assist, From elevated surface           General transfer comment: assist for rise and steady    Ambulation/Gait Ambulation/Gait assistance: Min assist Gait Distance (Feet): 10 Feet Assistive device: Rolling walker (2 wheels) Gait Pattern/deviations: Step-through pattern, Decreased stride length, Trunk flexed, Narrow base of support Gait velocity: decr     General Gait Details: assist to steady and manage RW, cues for sequencing and upright posture.  Stairs            Wheelchair Mobility     Tilt Bed    Modified Rankin (Stroke Patients Only)       Balance Overall balance assessment: Needs assistance Sitting-balance support: Bilateral upper extremity supported, Feet supported Sitting balance-Leahy Scale: Fair     Standing balance support: Bilateral upper extremity supported, During functional activity, Reliant on assistive device for balance Standing balance-Leahy Scale: Poor Standing balance comment: Reliant on RW                             Pertinent Vitals/Pain Pain Assessment Pain Assessment: Faces Faces Pain Scale: Hurts little more Pain Location: R knee, pelvis during standing Pain Descriptors / Indicators: Discomfort, Grimacing, Guarding Pain Intervention(s): Limited activity within patient's tolerance, Monitored during session, Repositioned    Home Living Family/patient expects to be discharged to:: Private residence Living Arrangements: Children Available Help at Discharge: Family;Available PRN/intermittently Type of Home:  House Home Access: Stairs to enter Entrance Stairs-Rails: None Secretary/administrator of Steps: 1   Home Layout: One level Home Equipment: None Additional Comments: Per daughter pt  goes to adult day care everyday while daughter works, when home has 24/7 supervision    Prior Function Prior Level of Function : Needs assist             Mobility Comments: No AD ADLs Comments: per daughter pt requiring mod to max assist for all ADLs including self feeding d/t cog and limited ROM.     Extremity/Trunk Assessment   Upper Extremity Assessment Upper Extremity Assessment: Defer to OT evaluation    Lower Extremity Assessment Lower Extremity Assessment: Generalized weakness (full AROM LAQ bilat)    Cervical / Trunk Assessment Cervical / Trunk Assessment: Other exceptions Cervical / Trunk Exceptions: scoliosis - rib hump R  Communication   Communication Communication: Impaired Factors Affecting Communication: Difficulty expressing self;Reduced clarity of speech (Pt soft spoken, limited verbal answers often non sensical. Mostly yes/no head gestures for communication)    Cognition Arousal: Alert Behavior During Therapy: Flat affect   PT - Cognitive impairments: History of cognitive impairments                       PT - Cognition Comments: history of dementia, pt mostly responding coherently with yes/no, otherwise mostly unintelligible. oriented to name only Following commands: Impaired Following commands impaired: Follows one step commands inconsistently, Follows one step commands with increased time     Cueing Cueing Techniques: Verbal cues, Visual cues, Tactile cues     General Comments General comments (skin integrity, edema, etc.): Daughter contacted via phone to determine PLOF and home set-up    Exercises     Assessment/Plan    PT Assessment Patient needs continued PT services  PT Problem List Decreased strength;Decreased mobility;Decreased safety awareness;Decreased knowledge of precautions;Decreased cognition;Decreased activity tolerance;Decreased balance;Decreased knowledge of use of DME;Pain       PT Treatment Interventions DME  instruction;Therapeutic activities;Gait training;Patient/family education;Therapeutic exercise;Balance training;Functional mobility training;Neuromuscular re-education    PT Goals (Current goals can be found in the Care Plan section)  Acute Rehab PT Goals PT Goal Formulation: With patient Time For Goal Achievement: 04/01/24 Potential to Achieve Goals: Good    Frequency Min 2X/week     Co-evaluation PT/OT/SLP Co-Evaluation/Treatment: Yes Reason for Co-Treatment: Necessary to address cognition/behavior during functional activity;For patient/therapist safety;To address functional/ADL transfers PT goals addressed during session: Balance;Mobility/safety with mobility OT goals addressed during session: ADL's and self-care;Other (comment)       AM-PAC PT "6 Clicks" Mobility  Outcome Measure Help needed turning from your back to your side while in a flat bed without using bedrails?: A Lot Help needed moving from lying on your back to sitting on the side of a flat bed without using bedrails?: A Lot Help needed moving to and from a bed to a chair (including a wheelchair)?: A Lot Help needed standing up from a chair using your arms (e.g., wheelchair or bedside chair)?: A Lot Help needed to walk in hospital room?: A Lot Help needed climbing 3-5 steps with a railing? : Total 6 Click Score: 11    End of Session   Activity Tolerance: Patient tolerated treatment well Patient left: in chair;with call bell/phone within reach;with chair alarm set Nurse Communication: Mobility status PT Visit Diagnosis: Other abnormalities of gait and mobility (R26.89);Muscle weakness (generalized) (M62.81);Pain Pain - part of body: Hip;Leg;Knee    Time: 7829-5621 PT  Time Calculation (min) (ACUTE ONLY): 20 min   Charges:   PT Evaluation $PT Eval Low Complexity: 1 Low   PT General Charges $$ ACUTE PT VISIT: 1 Visit         Shirlene Doughty, PT DPT Acute Rehabilitation Services Secure Chat Preferred  Office  [phone]   Arsenio Schnorr E Stroup 03/18/2024, 11:13 AM

## 2024-03-18 NOTE — Assessment & Plan Note (Addendum)
 03-18-2024 chronic. On seroquel  and prozac   03-19-2024 stable.  03-20-2024 stable on prozac  and seroquel   03-21-2024 stable on prozac /seroquel .  03-22-2024 stable.

## 2024-03-18 NOTE — ED Provider Notes (Signed)
 Patient discharged by previous shift.  Had a witnessed fall onto her left side.  Contrary to previous note she is complaining of left hip pain and right knee pain.  Family at bedside denies head injury.  Patient had negative x-ray of right hip and right knee and was discharged.  She remains unable to walk due to severe pain to her left hip and thigh area.  Will obtain additional x-rays and CT scan. She is able to range bilateral hips and knees but complains of pain to her left hip and thigh area.  No obvious deformities  Patient still with pain with attempted weightbearing.  CT pelvis is obtained and shows nondisplaced superior pubic ramus fracture on the left.  Hips themselves are intact.  CT head and C-spine are negative.  Given patient's ongoing pain and inability to weight-bear we will plan admission for therapies  D/w family and patient at bedside.  D/w Dr. Segars.    Earma Gloss, MD 03/18/24 [phone]

## 2024-03-18 NOTE — Assessment & Plan Note (Addendum)
 03-18-2024 repeat BMP in AM.  03-19-2024 resolved. Serum CO2 of 22 today.

## 2024-03-18 NOTE — Evaluation (Signed)
 Occupational Therapy Evaluation Patient Details Name: Darlene Ballard MRN: [id number] DOB: [birth year] Today's Date: 03/18/2024   History of Present Illness   Pt is a 78 y.o. female admitted 6/5 for fall in home. Imaging showed nondisplaced L superior pubic ramus fx. PMH: dementia, hypothyroidism, anxiety, recurrent UTIs, recent mission with stercoral colitis     Clinical Impressions Pt admitted based on above, and was seen based on problem list below. PTA pt was living with her daughter, attending adult day care during the day and receiving 24/7 supervision during the evenings. Pt unable to state many verbal answers, primary yes or no head nods, per daughter this is baseline. At home daughter assists with all ADLs d/t cog decline, difficulty sequencing and problem solving limiting pt.Today pt is requiring min  to mod assists for  ADLs mostly d/t cognition. Bed mobility and functional transfers are  min  assist. Discussed goals with pt's daughter via phone call, she reports a preference for <3 hours of skilled rehab daily upon d/c. OT will continue to follow acutely to maximize functional independence.        If plan is discharge home, recommend the following:   A little help with walking and/or transfers;A lot of help with bathing/dressing/bathroom;Assistance with feeding;Direct supervision/assist for medications management;Direct supervision/assist for financial management;Assistance with cooking/housework;Assist for transportation;Supervision due to cognitive status;Help with stairs or ramp for entrance     Functional Status Assessment   Patient has had a recent decline in their functional status and demonstrates the ability to make significant improvements in function in a reasonable and predictable amount of time.     Equipment Recommendations   Other (comment) (Defer to next venue)     Recommendations for Other Services         Precautions/Restrictions    Precautions Precautions: Fall Recall of Precautions/Restrictions: Impaired Restrictions Weight Bearing Restrictions Per Provider Order: Yes LLE Weight Bearing Per Provider Order: Weight bearing as tolerated     Mobility Bed Mobility Overal bed mobility: Needs Assistance Bed Mobility: Supine to Sit     Supine to sit: Min assist, HOB elevated     General bed mobility comments: Cues to sequence and iniate.    Transfers Overall transfer level: Needs assistance Equipment used: Rolling walker (2 wheels) Transfers: Sit to/from Stand, Bed to chair/wheelchair/BSC Sit to Stand: Min assist, From elevated surface     Step pivot transfers: Min assist     General transfer comment: Min assist to steady in standing. Pt unfamilar with RW, so potentially could be limiting her performance      Balance Overall balance assessment: Needs assistance Sitting-balance support: Bilateral upper extremity supported, Feet supported Sitting balance-Leahy Scale: Fair     Standing balance support: Bilateral upper extremity supported, During functional activity, Reliant on assistive device for balance Standing balance-Leahy Scale: Poor Standing balance comment: Reliant on RW         ADL either performed or assessed with clinical judgement   ADL Overall ADL's : Needs assistance/impaired Eating/Feeding: Minimal assistance;Sitting Eating/Feeding Details (indicate cue type and reason): In recliner with back supported, min assist d/t cog, requiring straw, not open lid to bring hand to mouth Grooming: Sitting;Minimal assistance   Upper Body Bathing: Moderate assistance;Sitting   Lower Body Bathing: Moderate assistance;Sit to/from stand   Upper Body Dressing : Minimal assistance;Sitting   Lower Body Dressing: Moderate assistance;Sit to/from stand   Toilet Transfer: Minimal assistance;Ambulation;Rolling walker (2 wheels) Toilet Transfer Details (indicate cue type and reason): Simulated in room  Functional mobility during ADLs: Minimal assistance;Rolling walker (2 wheels) General ADL Comments: Pt requiring assistance mostly d/t cog, requiring step by step cues and gestures at base     Vision Baseline Vision/History: 0 No visual deficits Vision Assessment?: No apparent visual deficits            Pertinent Vitals/Pain Pain Assessment Pain Assessment: Faces Faces Pain Scale: Hurts little more Pain Location: R knee Pain Descriptors / Indicators: Discomfort, Grimacing, Guarding Pain Intervention(s): Limited activity within patient's tolerance, Repositioned     Extremity/Trunk Assessment Upper Extremity Assessment Upper Extremity Assessment: Generalized weakness   Lower Extremity Assessment Lower Extremity Assessment: Defer to PT evaluation   Cervical / Trunk Assessment Cervical / Trunk Assessment: Other exceptions Cervical / Trunk Exceptions: scoliosis   Communication Communication Communication: Impaired Factors Affecting Communication: Difficulty expressing self;Reduced clarity of speech (Pt soft spoken, limited verbal answers often non sensical. Mostly yes/no head gestures for communication)   Cognition Arousal: Alert Behavior During Therapy: Flat affect Cognition: History of cognitive impairments     OT - Cognition Comments: Pt unable to state name or DOB, mostly nonverbal head nods for yes/no answers, per daughter pt at baseline     Following commands: Impaired Following commands impaired: Follows one step commands inconsistently, Follows one step commands with increased time     Cueing  General Comments   Cueing Techniques: Verbal cues;Visual cues;Tactile cues  Daughter contacted via phone to determine PLOF and home set-up           Home Living Family/patient expects to be discharged to:: Private residence Living Arrangements: Children Available Help at Discharge: Family;Available PRN/intermittently Type of Home: House Home Access:  Stairs to enter [employer] of Steps: 1 Entrance Stairs-Rails: None Home Layout: One level     Bathroom Shower/Tub: Producer, television/film/video: Handicapped height Bathroom Accessibility: No   Home Equipment: None   Additional Comments: Per daughter pt goes to adult day care everyday while daughter works, when home has 24/7 supervision      Prior Functioning/Environment Prior Level of Function : Needs assist       Mobility Comments: No AD ADLs Comments: per daughter pt requiring mod to max assist for all ADLs including self feeding d/t cog and limited ROM.    OT Problem List: Decreased strength;Decreased activity tolerance;Impaired balance (sitting and/or standing);Decreased safety awareness;Decreased knowledge of use of DME or AE   OT Treatment/Interventions: Therapeutic exercise;Self-care/ADL training;Energy conservation;DME and/or AE instruction;Therapeutic activities;Patient/family education;Balance training      OT Goals(Current goals can be found in the care plan section)   Acute Rehab OT Goals Patient Stated Goal: Per daughter short term rehab OT Goal Formulation: Patient unable to participate in goal setting Time For Goal Achievement: 04/01/24 Potential to Achieve Goals: Good   OT Frequency:  Min 2X/week    Co-evaluation PT/OT/SLP Co-Evaluation/Treatment: Yes Reason for Co-Treatment: Necessary to address cognition/behavior during functional activity;For patient/therapist safety;To address functional/ADL transfers   OT goals addressed during session: ADL's and self-care;Other (comment)      AM-PAC OT "6 Clicks" Daily Activity     Outcome Measure Help from another person eating meals?: A Little Help from another person taking care of personal grooming?: A Little Help from another person toileting, which includes using toliet, bedpan, or urinal?: Total Help from another person bathing (including washing, rinsing, drying)?: A Lot Help from  another person to put on and taking off regular upper body clothing?: A Little Help from another person to put  on and taking off regular lower body clothing?: A Lot 6 Click Score: 14   End of Session Equipment Utilized During Treatment: Gait belt;Rolling walker (2 wheels) Nurse Communication: Mobility status  Activity Tolerance: Patient limited by pain Patient left: in chair;with call bell/phone within reach;with chair alarm set  OT Visit Diagnosis: Unsteadiness on feet (R26.81);Other abnormalities of gait and mobility (R26.89);Repeated falls (R29.6);Muscle weakness (generalized) (M62.81);Pain Pain - Right/Left: Right Pain - part of body: Leg                Time: 4098-1191 OT Time Calculation (min): 19 min Charges:  OT General Charges $OT Visit: 1 Visit OT Evaluation $OT Eval Moderate Complexity: 1 Mod  Delmer Ferraris, OT  Acute Rehabilitation Services Office [phone] Secure chat preferred   Mickael Alamo 03/18/2024, 9:57 AM

## 2024-03-18 NOTE — Progress Notes (Signed)
 RE:  Darlene Ballard       Date of Birth:  [birth year]     Date:  03/18/24        To Whom It May Concern:  Please be advised that the above-named patient has a primary diagnosis of dementia which supersedes any psychiatric diagnosis.                 MD signature                Date

## 2024-03-18 NOTE — H&P (Signed)
 History and Physical    Darlene Ballard BJY:[id number] DOB: 1945/10/22 DOA: 03/17/2024  PCP: Claudene Crystal, PA-C   Patient coming from: Home   Chief Complaint:  Chief Complaint  Patient presents with   Fall    HPI: History limited due to patient's dementia Darlene Ballard is a 78 y.o. female with hx of Alzheimer's dementia, hypothyroidism, anxiety, recurrent UTIs, recent mission with stercoral colitis, was brought in from home after a ground-level fall.  History is provided by patient's daughter at the bedside.  Reports that she had a witnessed fall in which she was walking around the [place] in the kitchen and seem to lose her footing and fall on her right side.  Having pain in the right hip with attempted ambulation.  No other injuries noted.  Otherwise recently eating and drinking well, no other recent illness.   Review of Systems:  ROS complete and negative except as marked above   Allergies  Allergen Reactions   Yellow Jacket Venom Anaphylaxis and Swelling   Paxil [Paroxetine] Other (See Comments)    "Anger issues"   Zoloft [Sertraline] Other (See Comments)    "Anger issues"    Prior to Admission medications   Medication Sig Start Date End Date Taking? Authorizing Provider  FLUoxetine  (PROZAC ) 20 MG capsule Take 1 capsule (20 mg total) by mouth daily. 02/18/24  Yes Tysinger, Christiane Cowing, PA-C  levothyroxine  (SYNTHROID ) 88 MCG tablet Take 1 tablet (88 mcg total) by mouth daily. 03/14/24  Yes Tysinger, Christiane Cowing, PA-C  MELATONIN GUMMIES PO Take 10 mg by mouth at bedtime.   Yes [provider]  polyethylene glycol (MIRALAX  / GLYCOLAX ) 17 g packet Take 17 g by mouth daily. Patient taking differently: Take 17 g by mouth daily as needed. 02/21/24  Yes Samtani, Jai-Gurmukh, MD  QUEtiapine  (SEROQUEL ) 25 MG tablet Take one and half tablet at night (37.5 mg) , may increase  to 50 mg nightly if needed Patient taking differently: Take 50 mg by mouth at bedtime. Take one and half  tablet at night (37.5 mg) , may increase  to 50 mg nightly if needed 02/18/24  Yes Tysinger, Christiane Cowing, PA-C  senna (SENOKOT) 8.6 MG TABS tablet Take 1 tablet (8.6 mg total) by mouth at bedtime as needed for mild constipation. Patient not taking: Reported on 02/26/2024 02/21/24   Samtani, Jai-Gurmukh, MD    Past Medical History:  Diagnosis Date   Anxiety    Chronic knee pain    Dementia (HCC)    Hypothyroidism    Onychodystrophy    Sleep disturbance     Past Surgical History:  Procedure Laterality Date   TONSILLECTOMY AND ADENOIDECTOMY       reports that she has never smoked. She has never used smokeless tobacco. She reports that she does not drink alcohol and does not use drugs.  Family History  Problem Relation Age of Onset   Alzheimer's disease Mother    Heart disease Mother    Heart attack Mother    Other Father        blood disorder - unsure of condition     Physical Exam: Vitals:   03/17/24 2054 03/17/24 2104 03/18/24 0257  BP:  (!) 149/73 (!) 142/78  Pulse:  (!) 57 72  Resp:  17 16  Temp:  98.6 F (37 C) 98.4 F (36.9 C)  TempSrc:  Oral   SpO2:  100% 100%  Weight: 50.3 kg    Height: 5' (1.524 m)  Gen: Awake, alert, chronically ill-appearing CV: Regular, normal S1, S2, no murmurs  Resp: Normal WOB, CTAB  Abd: Flat, normoactive, nontender MSK: Symmetric, no edema. Log roll b/l lower ext without pain  Skin: No rashes or lesions to exposed skin  Neuro: Alert and interactive, oriented to self only Psych: euthymic, appropriate    Data review:   Labs reviewed, notable for:   K3, bicarb 19, anion gap 15  Micro:  Results for orders placed or performed in visit on 02/11/24  Microscopic Examination     Status: Abnormal   Collection Time: 02/11/24 12:00 AM   Urine  Result Value Ref Range Status   WBC, UA >30 (A) 0 - 5 /hpf Final   RBC, Urine 0-2 0 - 2 /hpf Final   Epithelial Cells (non renal) 0-10 0 - 10 /hpf Final   Casts Present None seen /lpf Final    Cast Type Hyaline casts N/A Final   Mucus, UA Present (A) Not Estab. Final   Bacteria, UA Moderate (A) None seen/Few Final  Urine Culture     Status: None   Collection Time: 02/11/24  9:15 AM   Specimen: Urine   UR  Result Value Ref Range Status   Urine Culture, Routine Final report  Final   Organism ID, Bacteria Comment  Final    Comment: Culture shows less than 10,000 colony forming units of bacteria per milliliter of urine. This colony count is not generally considered to be clinically significant.     Imaging reviewed:  CT Cervical Spine Wo Contrast Result Date: 03/18/2024 EXAM: CT CERVICAL SPINE WITHOUT CONTRAST 03/18/2024 03:21:52 AM TECHNIQUE: CT of the cervical was performed without the administration of intravenous contrast. Multiplanar reformatted images are provided for review. Automated exposure control, iterative reconstruction, and/or weight based adjustment of the mA/kV was utilized to reduce the radiation dose to as low as reasonably achievable. COMPARISON: None available. CLINICAL HISTORY: Neck trauma (Age >= 65y). Triage notes; Pt BIB [facility] from home after a fall. R knee and BIL hip pain. No thinners, No LOC. H/x dementia. FINDINGS: CERVICAL SPINE: BONES/ALIGNMENT: There is no acute fracture or traumatic malalignment. DEGENERATIVE CHANGES: Mild degenerative changes at C5-6 and C6-7. SOFT TISSUES: There is no prevertebral soft tissue swelling. IMPRESSION: 1. No traumatic injury to the cervical spine. Electronically signed by: Zadie Herter MD 03/18/2024 03:27 AM EDT RP Workstation: [id number]   CT Head Wo Contrast Result Date: 03/18/2024 EXAM: CT HEAD WITHOUT CONTRAST 03/18/2024 03:21:52 AM TECHNIQUE: CT of the head was performed without the administration of intravenous contrast. Automated exposure control, iterative reconstruction, and/or weight based adjustment of the mA/kV was utilized to reduce the radiation dose to as low as reasonably achievable. COMPARISON:  04/15/2023 CLINICAL HISTORY: Head trauma, moderate-severe. Triage notes; Pt BIB [facility] from home after a fall. R knee and BIL hip pain. No thinners, No LOC. H/x dementia. FINDINGS: BRAIN AND VENTRICLES: There is no acute intracranial hemorrhage, mass effect or midline shift. No abnormal extra-axial fluid collection. The gray-white differentiation is maintained without an acute infarct. There is no hydrocephalus. Global cortical atrophy. Subcortical and periventricular small vessel ischemic changes. ORBITS: The visualized portion of the orbits demonstrate no acute abnormality. SINUSES: The visualized paranasal sinuses and mastoid air cells demonstrate no acute abnormality. SOFT TISSUES AND SKULL: No acute abnormality of the visualized skull or soft tissues. IMPRESSION: 1. No acute intracranial abnormality. 2. Global cortical atrophy and small vessel ischemic changes. Electronically signed by: Zadie Herter MD 03/18/2024 03:26 AM EDT RP  Workstation: [id number]   CT PELVIS WO CONTRAST Result Date: 03/18/2024 EXAM: CT Pelvis, WITHOUT IV CONTRAST 03/18/2024 03:21:52 AM TECHNIQUE: Axial images were acquired through the pelvis without IV contrast. Reformatted images were reviewed. Automated exposure control, iterative reconstruction, and/or weight based adjustment of the mA/kV was utilized to reduce the radiation dose to as low as reasonably achievable. COMPARISON: 02/19/2024 CLINICAL HISTORY: Hip trauma, fracture suspected, xray done. Triage notes; Pt BIB [facility] from home after a fall. R knee and BIL hip pain. No thinners, No LOC. H/x dementia. FINDINGS: BONES: Nondisplaced left parasymphyseal/superior pubic ramus fracture (coronal image 82). Bilateral hips are intact. JOINTS: No dislocation. The joint spaces are normal. SOFT TISSUES: The soft tissues are unremarkable. INTRAPELVIC CONTENTS: Thick-walled bladder, although underdistended. Moderate colonic stool burden. IMPRESSION: 1. Nondisplaced left parasymphyseal /  superior pubic ramus fracture. 2. Bilateral hips are intact. Electronically signed by: Zadie Herter MD 03/18/2024 03:26 AM EDT RP Workstation: [id number]   DG Knee Complete 4 Views Left Result Date: 03/18/2024 CLINICAL DATA:  Fall, pain EXAM: LEFT KNEE - COMPLETE 4+ VIEW COMPARISON:  None Available. FINDINGS: Normal alignment. No acute fracture or dislocation. Mild to moderate tricompartmental degenerative arthritis, most severe within the lateral compartment. Trace effusion. Soft tissues are otherwise unremarkable. IMPRESSION: 1. Mild to moderate tricompartmental degenerative arthritis. Electronically Signed   By: Worthy Heads M.D.   On: 03/18/2024 02:46   DG Femur Min 2 Views Left Result Date: 03/18/2024 CLINICAL DATA:  Fall, left hip pain EXAM: LEFT FEMUR 2 VIEWS COMPARISON:  None Available. FINDINGS: There is no evidence of fracture or other focal bone lesions. Mild tricompartmental left knee degenerative arthritis. Mild left hip degenerative arthritis. Soft tissues are unremarkable. IMPRESSION: 1. No fracture or dislocation. Mild degenerative change. Electronically Signed   By: Worthy Heads M.D.   On: 03/18/2024 02:45   DG Hip Unilat W or Wo Pelvis 2-3 Views Left Result Date: 03/18/2024 CLINICAL DATA:  Fall, hip pain EXAM: DG HIP (WITH OR WITHOUT PELVIS) 2-3V LEFT COMPARISON:  None Available. FINDINGS: Normal alignment. No acute fracture or dislocation. Mild bilateral degenerative hip arthritis. Soft tissues are unremarkable. IMPRESSION: 1. Mild bilateral degenerative hip arthritis. Electronically Signed   By: Worthy Heads M.D.   On: 03/18/2024 02:44   DG Knee Complete 4 Views Right Result Date: 03/17/2024 EXAM: 4 or more VIEW(S) XRAY OF THE RIGHT KNEE 03/17/2024 10:33:24 PM COMPARISON: None available. CLINICAL HISTORY: R hip pain. Encounter for right hip pain from fall. FINDINGS: BONES AND JOINTS: No acute fracture. No focal osseous lesion. No joint dislocation. No significant joint effusion.  Moderate tricompartmental degenerative changes most prominent in the lateral compartment. SOFT TISSUES: The soft tissues are unremarkable. IMPRESSION: 1. No fracture or dislocation. 2. Moderate degenerative changes. Electronically signed by: Zadie Herter MD 03/17/2024 10:40 PM EDT RP Workstation: [id number]   DG Hip Unilat W or Wo Pelvis 2-3 Views Right Result Date: 03/17/2024 EXAM: 3 VIEW(S) XRAY OF THE RIGHT HIP 03/17/2024 10:31:25 PM COMPARISON: None available. CLINICAL HISTORY: R hip pain. Encounter for right hip pain, fall FINDINGS: BONES AND JOINTS: No acute fracture or focal osseous lesion. The hip joint is maintained. No significant degenerative changes. SOFT TISSUES: Calcifications overlying the right lower abdomen are presumably external to the patient when correlating with priors. Moderate rectal stool burden, suggesting mild fecal impaction. IMPRESSION: 1. No acute fracture or dislocation. 2. Moderate rectal stool burden, suggesting mild fecal impaction. Electronically signed by: Zadie Herter MD 03/17/2024 10:39 PM EDT RP Workstation: [id number]  ED Course:  Imaging revealing a nondisplaced left superior pubic ramus fracture, with attempted ambulation and severe pain and felt not safe for discharge home.   Assessment/Plan:  78 y.o. female with hx Alzheimer's dementia, hypothyroidism, anxiety, recurrent UTIs, recent mission with stercoral colitis, was brought in from home after a ground-level fall, c/b nondisplaced left superior pubic ramus fracture.  Brought in for observation for pain control and evaluation for physical therapy.  Ground-level fall, mechanical Nondisplaced left superior pubic ramus fracture - PT, OT evaluation - Pain control: Tylenol  as needed mild, methocarbamol as needed spasm, oxycodone  2.5/5 mg every 4 hours for moderate/severe, Dilaudid 0.5 mg IV every 4 hours as needed for breakthrough  Anion gap metabolic acidosis - Check lactate - Give 1 L  LR  Hypokalemia - Repleted  Chronic medical problems: Alzheimer's dementia: Continue home Seroquel  Hypothyroidism: Continue home levothyroxine  Anxiety: Continue home fluoxetine  History recurrent UTI: Check UA although no recent urinary symptoms History recent stercoral colitis: MiraLAX  3 times per week scheduled per home regimen.  Body mass index is 21.66 kg/m.    DVT prophylaxis:  Lovenox  Code Status:  DNR/DNI(Do NOT Intubate); confirmed with daughter  Diet:  Diet Orders (From admission, onward)     Start     Ordered   03/18/24 0423  Diet regular Room service appropriate? Yes; Fluid consistency: Thin  Diet effective now       Question Answer Comment  Room service appropriate? Yes   Fluid consistency: Thin      03/18/24 0428           Family Communication:  Yes discussed with daughter at bedside Consults: None Admission status:   Observation, Med-Surg  Severity of Illness: The appropriate patient status for this patient is OBSERVATION. Observation status is judged to be reasonable and necessary in order to provide the required intensity of service to ensure the patient's safety. The patient's presenting symptoms, physical exam findings, and initial radiographic and laboratory data in the context of their medical condition is felt to place them at decreased risk for further clinical deterioration. Furthermore, it is anticipated that the patient will be medically stable for discharge from the hospital within 2 midnights of admission.    Arnulfo Larch, MD [facility]  How to contact the [facility] Attending or Consulting provider 7A - 7P or covering provider during after hours 7P -7A, for this patient.  Check the care team in [facility] and look for a) attending/consulting [facility] provider listed and b) the [facility] team listed Log into [email or web address] and use [facility] universal password to access. If you do not have the password, please contact the hospital operator. Locate the [facility]  provider you are looking for under [facility] and page to a number that you can be directly reached. If you still have difficulty reaching the provider, please page the [facility] (Director on Call) for the Hospitalists listed on amion for assistance.  03/18/2024, 4:49 AM

## 2024-03-18 NOTE — Assessment & Plan Note (Addendum)
 03-18-2024 continue synthroid  88 mcg daily.  03-19-2024 stable.  03-20-2024 stable on synthroid   03-21-2024 stable.  03-22-2024 stable.

## 2024-03-18 NOTE — Assessment & Plan Note (Addendum)
 03-18-2024 stable. Family wants to place patient into SNF. PT/OT consults ordered  03-19-2024 pt only able to walk 10 feet with PT. still no indications for inpatient admission. Family wanting SNF. CM looking for placement.   03-20-2024 CM looking for SNF placement.   03-21-2024 still awaiting for SNF placement  03-22-2024 awaiting SNF placement.
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
## 2024-03-18 NOTE — Progress Notes (Signed)
 RE:  Darlene Ballard       Date of Birth:  12/29/2045     Date:  03/18/24        To Whom It May Concern:  Please be advised that the above-named patient will require a short-term nursing home stay - anticipated 30 days or less for rehabilitation and strengthening.  The plan is for return home.                 MD signature                Date

## 2024-03-19 DIAGNOSIS — E876 Hypokalemia: Secondary | ICD-10-CM | POA: Diagnosis not present

## 2024-03-19 DIAGNOSIS — W1830XA Fall on same level, unspecified, initial encounter: Secondary | ICD-10-CM | POA: Diagnosis not present

## 2024-03-19 DIAGNOSIS — Z66 Do not resuscitate: Secondary | ICD-10-CM | POA: Diagnosis not present

## 2024-03-19 DIAGNOSIS — E872 Acidosis, unspecified: Secondary | ICD-10-CM | POA: Diagnosis not present

## 2024-03-19 DIAGNOSIS — E039 Hypothyroidism, unspecified: Secondary | ICD-10-CM | POA: Diagnosis not present

## 2024-03-19 DIAGNOSIS — S32591A Other specified fracture of right pubis, initial encounter for closed fracture: Secondary | ICD-10-CM | POA: Diagnosis not present

## 2024-03-19 DIAGNOSIS — F03918 Unspecified dementia, unspecified severity, with other behavioral disturbance: Secondary | ICD-10-CM | POA: Diagnosis not present

## 2024-03-19 DIAGNOSIS — I10 Essential (primary) hypertension: Secondary | ICD-10-CM | POA: Diagnosis not present

## 2024-03-19 LAB — BASIC METABOLIC PANEL WITH GFR
Anion gap: 7 (ref 5–15)
BUN: 10 mg/dL (ref 8–23)
CO2: 22 mmol/L (ref 22–32)
Calcium: 8.6 mg/dL — ABNORMAL LOW (ref 8.9–10.3)
Chloride: 109 mmol/L (ref 98–111)
Creatinine, Ser: 0.56 mg/dL (ref 0.44–1.00)
GFR, Estimated: >60 mL/min (ref >60)
Glucose, Bld: 92 mg/dL (ref 70–99)
Potassium: 3.9 mmol/L (ref 3.5–5.1)
Sodium: 138 mmol/L (ref 135–145)

## 2024-03-19 LAB — MAGNESIUM: Magnesium: 1.9 mg/dL (ref 1.7–2.4)

## 2024-03-19 LAB — PHOSPHORUS: Phosphorus: 3.6 mg/dL (ref 2.5–4.6)
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
## 2024-03-19 NOTE — Plan of Care (Signed)
  Problem: Health Behavior/Discharge Planning: Goal: Ability to manage health-related needs will improve Outcome: Progressing   Problem: Clinical Measurements: Goal: Ability to maintain clinical measurements within normal limits will improve Outcome: Progressing Goal: Will remain free from infection Outcome: Progressing Goal: Diagnostic test results will improve Outcome: Progressing Goal: Respiratory complications will improve Outcome: Progressing Goal: Cardiovascular complication will be avoided Outcome: Progressing   Problem: Activity: Goal: Risk for activity intolerance will decrease Outcome: Progressing   Problem: Nutrition: Goal: Adequate nutrition will be maintained Outcome: Progressing   Problem: Coping: Goal: Level of anxiety will decrease Outcome: Progressing   Problem: Elimination: Goal: Will not experience complications related to bowel motility Outcome: Progressing   Problem: Pain Managment: Goal: General experience of comfort will improve and/or be controlled Outcome: Progressing   Problem: Skin Integrity: Goal: Risk for impaired skin integrity will decrease Outcome: Progressing

## 2024-03-20 DIAGNOSIS — Z66 Do not resuscitate: Secondary | ICD-10-CM | POA: Diagnosis not present

## 2024-03-20 DIAGNOSIS — E876 Hypokalemia: Secondary | ICD-10-CM | POA: Diagnosis not present

## 2024-03-20 DIAGNOSIS — E872 Acidosis, unspecified: Secondary | ICD-10-CM | POA: Diagnosis not present

## 2024-03-20 DIAGNOSIS — W1830XA Fall on same level, unspecified, initial encounter: Secondary | ICD-10-CM | POA: Diagnosis not present

## 2024-03-20 DIAGNOSIS — S32591A Other specified fracture of right pubis, initial encounter for closed fracture: Secondary | ICD-10-CM | POA: Diagnosis not present

## 2024-03-20 DIAGNOSIS — I10 Essential (primary) hypertension: Secondary | ICD-10-CM | POA: Diagnosis not present

## 2024-03-20 DIAGNOSIS — E039 Hypothyroidism, unspecified: Secondary | ICD-10-CM | POA: Diagnosis not present

## 2024-03-20 DIAGNOSIS — F03918 Unspecified dementia, unspecified severity, with other behavioral disturbance: Secondary | ICD-10-CM | POA: Diagnosis not present

## 2024-03-20 NOTE — Progress Notes (Signed)
 PROGRESS NOTE    NABRIA Ballard  ZOX:[id number] DOB: [birth year] DOA: 03/17/2024 PCP: Claudene Crystal, PA-C  Subjective: Pt seen and examined. Pleasantly demented. No issues overnight. Awaiting SNF placement by CM.   Hospital Course: HPI: Darlene Ballard is a 78 y.o. female with hx of Alzheimer's dementia, hypothyroidism, anxiety, recurrent UTIs, recent admission with stercoral colitis, was brought in from home after a ground-level fall.  History is provided by patient's daughter at the bedside.  Reports that she had a witnessed fall in which she was walking around [place] in the kitchen and seem to lose her footing and fall on her right side.  Having pain in the right hip with attempted ambulation.  No other injuries noted.  Otherwise recently eating and drinking well, no other recent illness.   Significant Events: Admitted 03/17/2024 for left superior pubic ramus fracture   Admission Labs: WBC 9.8, HgB 12.8, plt 129 Na 139, K 3.0, CO2 of 19, BUN 18, Scr 0.57, glu 108  Admission Imaging Studies: XR pelvis No acute fracture or dislocation. 2. Moderate rectal stool burden, suggesting mild fecal impaction Right Knee XR No fracture or dislocation. 2. Moderate degenerative changes Left femur XR No fracture or dislocation. Mild degenerative change  CT pelvis Nondisplaced left parasymphyseal / superior pubic ramus fracture. 2. Bilateral hips are intact CT head No acute intracranial abnormality. 2. Global cortical atrophy and small vessel ischemic changes CT c-spine No traumatic injury to the cervical spine   Significant Labs:   Significant Imaging Studies:   Antibiotic Therapy: Anti-infectives (From admission, onward)    None       Procedures:   Consultants:     Assessment and Plan: * Pubic ramus fracture, right, closed, initial encounter (HCC) 03-18-2024 stable. Start scheduled tylenol . Pt is WBAT. No acute medical indications for inpatient  admission.  03-19-2024 still no indications for inpatient admission. Family wanting SNF. CM looking for placement.  03-20-2024 CM looking for SNF placement.  Ground-level fall 03-18-2024 stable. Family wants to place patient into SNF. PT/OT consults ordered  03-19-2024 pt only able to walk 10 feet with PT. still no indications for inpatient admission. Family wanting SNF. CM looking for placement.   03-20-2024 CM looking for SNF placement.   DNR (do not resuscitate) Pt was made DNR/DNI on admission.  Acquired hypothyroidism 03-18-2024 continue synthroid  88 mcg daily.  03-19-2024 stable.  03-20-2024 stable on synthroid   Dementia with behavioral disturbance (HCC) 03-18-2024 chronic. On seroquel  and prozac   03-19-2024 stable.  03-20-2024 stable on prozac  and seroquel   Essential hypertension 03-18-2024 stable. Not on any HTN meds.  03-19-2024 stable.  03-20-2024 stable. Not on any HTN meds.  Metabolic acidosis-resolved as of 03/19/2024 03-18-2024 repeat BMP in AM.  03-19-2024 resolved. Serum CO2 of 22 today.  Hypokalemia-resolved as of 03/19/2024 03-18-2024 replete with po kcl.  03-19-2024 resolved. K of 3.9 today.   DVT prophylaxis: enoxaparin  (LOVENOX ) injection 40 mg Start: 03/18/24 1000    Code Status: Limited: Do not attempt resuscitation (DNR) -DNR-LIMITED -Do Not Intubate/DNI  Family Communication: no family at bedside Disposition Plan: SNF vs home Reason for continuing need for hospitalization: medically stable for DC. No acute medical issues.  Objective: Vitals:   03/19/24 0734 03/19/24 1625 03/19/24 1948 03/20/24 0446  BP: (!) 146/72 118/71 (!) 148/84 (!) 143/84  Pulse: 62 76 66 63  Resp: 18 18 18 18   Temp: 98.2 F (36.8 C) 97.7 F (36.5 C) 97.6 F (36.4 C) (!) 97.4 F (36.3  C)  TempSrc:   Oral   SpO2: 97% 100% 100% 99%  Weight:      Height:       No intake or output data in the 24 hours ending 03/20/24 0809 Filed Weights   03/17/24 2054   Weight: 50.3 kg   Examination:  Physical Exam Vitals and nursing note reviewed.  Constitutional:      Comments: Awake, frail appearing. Pleasantly demented  HENT:     Head: Normocephalic and atraumatic.     Nose: Nose normal.  Eyes:     General: No scleral icterus. Cardiovascular:     Rate and Rhythm: Normal rate.  Pulmonary:     Effort: Pulmonary effort is normal.     Breath sounds: Normal breath sounds.  Abdominal:     General: Abdomen is flat. Bowel sounds are normal. There is no distension.  Skin:    General: Skin is warm and dry.     Capillary Refill: Capillary refill takes less than 2 seconds.  Neurological:     Mental Status: She is disoriented.    Data Reviewed: I have personally reviewed following labs and imaging studies  CBC: Recent Labs  Lab 03/18/24 0256  WBC 9.8  NEUTROABS 7.7  HGB 12.8  HCT 36.4  MCV 92.2  PLT 129*   Basic Metabolic Panel: Recent Labs  Lab 03/18/24 0256 03/19/24 0503  NA 139 138  K 3.0* 3.9  CL 105 109  CO2 19* 22  GLUCOSE 108* 92  BUN 18 10  CREATININE 0.57 0.56  CALCIUM 8.5* 8.6*  MG  --  1.9  PHOS  --  3.6   GFR: Estimated Creatinine Clearance: 43 mL/min (by C-G formula based on SCr of 0.56 mg/dL). Sepsis Labs: Recent Labs  Lab 03/18/24 0424  LATICACIDVEN 1.3   Scheduled Meds:  acetaminophen   1,000 mg Oral QID   enoxaparin  (LOVENOX ) injection  40 mg Subcutaneous Q24H   FLUoxetine   20 mg Oral Daily   levothyroxine   88 mcg Oral Daily   melatonin  6 mg Oral QHS   polyethylene glycol  17 g Oral Once per day on Monday Wednesday Friday   QUEtiapine   37.5 mg Oral QHS   Continuous Infusions:   LOS: 0 days   Time spent: 50 minutes  Unk Garb, DO  [facility]  03/20/2024, 8:09 AM

## 2024-03-21 DIAGNOSIS — Z66 Do not resuscitate: Secondary | ICD-10-CM | POA: Diagnosis not present

## 2024-03-21 DIAGNOSIS — F03918 Unspecified dementia, unspecified severity, with other behavioral disturbance: Secondary | ICD-10-CM | POA: Diagnosis not present

## 2024-03-21 DIAGNOSIS — W1830XA Fall on same level, unspecified, initial encounter: Secondary | ICD-10-CM | POA: Diagnosis not present

## 2024-03-21 DIAGNOSIS — I10 Essential (primary) hypertension: Secondary | ICD-10-CM | POA: Diagnosis not present

## 2024-03-21 DIAGNOSIS — E039 Hypothyroidism, unspecified: Secondary | ICD-10-CM | POA: Diagnosis not present

## 2024-03-21 DIAGNOSIS — S32591A Other specified fracture of right pubis, initial encounter for closed fracture: Secondary | ICD-10-CM | POA: Diagnosis not present

## 2024-03-21 NOTE — Progress Notes (Signed)
 PROGRESS NOTE    Darlene Ballard  FAO:[id number] DOB: May 15, 1946 DOA: 03/17/2024 PCP: Claudene Crystal, PA-C  Subjective: Pt seen and examined.  Pleasantly demented.  No issues overnight.  Awaiting SNF placement by CM.   Hospital Course: HPI: Darlene Ballard is a 78 y.o. female with hx of Alzheimer's dementia, hypothyroidism, anxiety, recurrent UTIs, recent admission with stercoral colitis, was brought in from home after a ground-level fall.  History is provided by patient's daughter at the bedside.  Reports that she had a witnessed fall in which she was walking around the [place] in the kitchen and seem to lose her footing and fall on her right side.  Having pain in the right hip with attempted ambulation.  No other injuries noted.  Otherwise recently eating and drinking well, no other recent illness.   Significant Events: Admitted 03/17/2024 for left superior pubic ramus fracture   Admission Labs: WBC 9.8, HgB 12.8, plt 129 Na 139, K 3.0, CO2 of 19, BUN 18, Scr 0.57, glu 108  Admission Imaging Studies: XR pelvis No acute fracture or dislocation. 2. Moderate rectal stool burden, suggesting mild fecal impaction Right Knee XR No fracture or dislocation. 2. Moderate degenerative changes Left femur XR No fracture or dislocation. Mild degenerative change  CT pelvis Nondisplaced left parasymphyseal / superior pubic ramus fracture. 2. Bilateral hips are intact CT head No acute intracranial abnormality. 2. Global cortical atrophy and small vessel ischemic changes CT c-spine No traumatic injury to the cervical spine   Significant Labs:   Significant Imaging Studies:   Antibiotic Therapy: Anti-infectives (From admission, onward)    None       Procedures:   Consultants:     Assessment and Plan: * Pubic ramus fracture, right, closed, initial encounter (HCC) 03-18-2024 stable. Start scheduled tylenol . Pt is WBAT. No acute medical indications for inpatient  admission.  03-19-2024 still no indications for inpatient admission. Family wanting SNF. CM looking for placement.  03-20-2024 CM looking for SNF placement.  03-21-2024 still awaiting for SNF placement  Ground-level fall 03-18-2024 stable. Family wants to place patient into SNF. PT/OT consults ordered  03-19-2024 pt only able to walk 10 feet with PT. still no indications for inpatient admission. Family wanting SNF. CM looking for placement.   03-20-2024 CM looking for SNF placement.   03-21-2024 still awaiting for SNF placement  DNR (do not resuscitate) Pt was made DNR/DNI on admission.  Acquired hypothyroidism 03-18-2024 continue synthroid  88 mcg daily.  03-19-2024 stable.  03-20-2024 stable on synthroid   03-21-2024 stable.  Dementia with behavioral disturbance (HCC) 03-18-2024 chronic. On seroquel  and prozac   03-19-2024 stable.  03-20-2024 stable on prozac  and seroquel   03-21-2024 stable on prozac /seroquel .  Essential hypertension 03-18-2024 stable. Not on any HTN meds.  03-19-2024 stable.  03-20-2024 stable. Not on any HTN meds.  03-21-2024 stable. Would not start HTN meds until SBP in the 160 range to prevent any chance of orthostasis.  Metabolic acidosis-resolved as of 03/19/2024 03-18-2024 repeat BMP in AM.  03-19-2024 resolved. Serum CO2 of 22 today.  Hypokalemia-resolved as of 03/19/2024 03-18-2024 replete with po kcl.  03-19-2024 resolved. K of 3.9 today.  DVT prophylaxis: enoxaparin  (LOVENOX ) injection 40 mg Start: 03/18/24 1000    Code Status: Limited: Do not attempt resuscitation (DNR) -DNR-LIMITED -Do Not Intubate/DNI  Family Communication: no family at bedside Disposition Plan: unknown. Possible SNF. Reason for continuing need for hospitalization: medically stable for DC  Objective: Vitals:   03/20/24 2001 03/20/24 2100 03/21/24 0441 03/21/24  0738  BP: 124/75 (!) 156/65 133/74 (!) 141/80  Pulse: 66 67 65 68  Resp: 17  16   Temp: 97.9 F  (36.6 C) 98 F (36.7 C) 98.2 F (36.8 C) 98 F (36.7 C)  TempSrc: Oral Oral Oral   SpO2: 99% 95% 100% 100%  Weight:      Height:        Intake/Output Summary (Last 24 hours) at 03/21/2024 0956 Last data filed at 03/20/2024 1300 Gross per 24 hour  Intake 240 ml  Output --  Net 240 ml   Filed Weights   03/17/24 2054  Weight: 50.3 kg    Examination:  Physical Exam Vitals and nursing note reviewed.  Constitutional:      General: She is not in acute distress.    Appearance: She is not toxic-appearing.     Comments: awake  HENT:     Head: Normocephalic and atraumatic.  Cardiovascular:     Rate and Rhythm: Normal rate and regular rhythm.  Pulmonary:     Effort: Pulmonary effort is normal.     Breath sounds: Normal breath sounds.  Abdominal:     General: Abdomen is flat. Bowel sounds are normal.     Palpations: Abdomen is soft.  Musculoskeletal:     Right lower leg: No edema.     Left lower leg: No edema.  Skin:    Capillary Refill: Capillary refill takes less than 2 seconds.  Neurological:     Comments: Pleasantly demented Has her stuffed toy dog at bedside.     Data Reviewed: I have personally reviewed following labs and imaging studies  CBC: Recent Labs  Lab 03/18/24 0256  WBC 9.8  NEUTROABS 7.7  HGB 12.8  HCT 36.4  MCV 92.2  PLT 129*   Basic Metabolic Panel: Recent Labs  Lab 03/18/24 0256 03/19/24 0503  NA 139 138  K 3.0* 3.9  CL 105 109  CO2 19* 22  GLUCOSE 108* 92  BUN 18 10  CREATININE 0.57 0.56  CALCIUM 8.5* 8.6*  MG  --  1.9  PHOS  --  3.6   GFR: Estimated Creatinine Clearance: 43 mL/min (by C-G formula based on SCr of 0.56 mg/dL). Sepsis Labs: Recent Labs  Lab 03/18/24 0424  LATICACIDVEN 1.3   Scheduled Meds:  acetaminophen   1,000 mg Oral QID   enoxaparin  (LOVENOX ) injection  40 mg Subcutaneous Q24H   FLUoxetine   20 mg Oral Daily   levothyroxine   88 mcg Oral Daily   melatonin  6 mg Oral QHS   polyethylene glycol  17 g Oral  Once per day on Monday Wednesday Friday   QUEtiapine   37.5 mg Oral QHS   Continuous Infusions:   LOS: 0 days   Time spent: 50 minutes  Unk Garb, DO  [facility]  03/21/2024, 9:56 AM

## 2024-03-21 NOTE — TOC Progression Note (Signed)
 Transition of Care [facility]) - Progression Note    Patient Details  Name: Darlene Ballard MRN: [id number] Date of Birth: 1946-09-22  [facility] CM/SW Contact  Elspeth Hals, LCSW Phone Number: 03/21/2024, 10:12 AM  Clinical Narrative:   CSW provided bed offers to daughter Shelvy Dickens: two offers that are for locked units: [facility] and [facility].  She accepts offer at [facility].  CSW confirmed with [facility] that they can receive pt today with approved auth.    Auth request submitted with HTA.  Updated PT note requested.     Expected Discharge Plan: Skilled Nursing Facility Barriers to Discharge: SNF Pending bed offer  Expected Discharge Plan and Services In-house Referral: Clinical Social Work   Post Acute Care Choice: Skilled Nursing Facility Living arrangements for the past 2 months: Single Family Home                                       Social Determinants of Health (SDOH) Interventions SDOH Screenings   Food Insecurity: No Food Insecurity (03/18/2024)  Housing: Low Risk  (03/18/2024)  Transportation Needs: No Transportation Needs (03/18/2024)  Utilities: Not At Risk (03/18/2024)  Alcohol Screen: Low Risk  (05/19/2023)  Depression (PHQ2-9): Low Risk  (05/19/2023)  Financial Resource Strain: Low Risk  (05/19/2023)  Physical Activity: Sufficiently Active (05/19/2023)  Social Connections: Moderately Integrated (03/18/2024)  Stress: No Stress Concern Present (05/19/2023)  Tobacco Use: Low Risk  (03/17/2024)  Health Literacy: Adequate Health Literacy (05/19/2023)    Readmission Risk Interventions    09/15/2023    3:03 PM  Readmission Risk Prevention Plan  Transportation Screening Complete  PCP or Specialist Appt within 5-7 Days Complete  Home Care Screening Complete  Medication Review (RN CM) Complete

## 2024-03-21 NOTE — Progress Notes (Signed)
 Physical Therapy Treatment Patient Details Name: Darlene Ballard MRN: [id number] DOB: August 17, 1946 Today's Date: 03/21/2024   History of Present Illness Pt is a 78 y.o. female admitted 6/5 for fall in home. Imaging showed nondisplaced L superior pubic ramus fx. PMH: dementia, hypothyroidism, anxiety, recurrent UTIs, recent mission with stercoral colitis    PT Comments  Pt received sitting in the recliner and agreeable to session. Pt able to stand and ambulate in the room with min A for balance and safety. Pt pleasantly confused and requires increased cues throughout due to impaired cognition. Pt able to have a BM and requires assist with pericare. Pt continues to benefit from PT services to progress toward functional mobility goals.     If plan is discharge home, recommend the following: A little help with walking and/or transfers;A little help with bathing/dressing/bathroom   Can travel by private vehicle     Yes  Equipment Recommendations  None recommended by PT    Recommendations for Other Services       Precautions / Restrictions Precautions Precautions: Fall Recall of Precautions/Restrictions: Impaired Restrictions Weight Bearing Restrictions Per Provider Order: Yes LLE Weight Bearing Per Provider Order: Weight bearing as tolerated     Mobility  Bed Mobility               General bed mobility comments: Pt sitting in recliner at beginning and end of session    Transfers Overall transfer level: Needs assistance Equipment used: Rolling walker (2 wheels) Transfers: Sit to/from Stand Sit to Stand: Min assist, Contact guard assist           General transfer comment: Min A from recliner for balance and CGA from elevated toilet seat with cues for hand placement    Ambulation/Gait Ambulation/Gait assistance: Min assist Gait Distance (Feet): 30 Feet (+10) Assistive device: Rolling walker (2 wheels) Gait Pattern/deviations: Step-through pattern, Decreased stride  length, Trunk flexed, Narrow base of support Gait velocity: decr     General Gait Details: Min A for RW management and intermittently for stability. Pt with increased difficulty advancing RLE  and B low foot clearance.   Stairs             Wheelchair Mobility     Tilt Bed    Modified Rankin (Stroke Patients Only)       Balance Overall balance assessment: Needs assistance Sitting-balance support: Bilateral upper extremity supported, Feet supported Sitting balance-Leahy Scale: Fair     Standing balance support: Bilateral upper extremity supported, During functional activity, Reliant on assistive device for balance, No upper extremity supported Standing balance-Leahy Scale: Fair Standing balance comment: with RW support during ambulation. Pt able to let go in static stance without LOB                            Communication Communication Communication: Impaired Factors Affecting Communication: Difficulty expressing self;Reduced clarity of speech  Cognition Arousal: Alert Behavior During Therapy: Flat affect   PT - Cognitive impairments: History of cognitive impairments                         Following commands: Impaired Following commands impaired: Follows one step commands inconsistently, Follows one step commands with increased time    Cueing Cueing Techniques: Verbal cues, Visual cues, Tactile cues  Exercises      General Comments        Pertinent Vitals/Pain Pain Assessment Pain Assessment: Faces  Faces Pain Scale: Hurts little more Pain Location: pelvis with mobility Pain Descriptors / Indicators: Discomfort, Grimacing, Guarding Pain Intervention(s): Limited activity within patient's tolerance, Monitored during session, Repositioned     PT Goals (current goals can now be found in the care plan section) Acute Rehab PT Goals PT Goal Formulation: With patient Time For Goal Achievement: 04/01/24 Progress towards PT goals:  Progressing toward goals    Frequency    Min 2X/week       AM-PAC PT "6 Clicks" Mobility   Outcome Measure  Help needed turning from your back to your side while in a flat bed without using bedrails?: A Lot Help needed moving from lying on your back to sitting on the side of a flat bed without using bedrails?: A Lot Help needed moving to and from a bed to a chair (including a wheelchair)?: A Little Help needed standing up from a chair using your arms (e.g., wheelchair or bedside chair)?: A Little Help needed to walk in hospital room?: A Little Help needed climbing 3-5 steps with a railing? : A Lot 6 Click Score: 15    End of Session Equipment Utilized During Treatment: Gait belt Activity Tolerance: Patient tolerated treatment well Patient left: in chair;with call bell/phone within reach;with chair alarm set;with family/visitor present Nurse Communication: Mobility status PT Visit Diagnosis: Other abnormalities of gait and mobility (R26.89);Muscle weakness (generalized) (M62.81);Pain     Time: 1310-1330 PT Time Calculation (min) (ACUTE ONLY): 20 min  Charges:    $Gait Training: 8-22 mins PT General Charges $$ ACUTE PT VISIT: 1 Visit                    Michaelle Adolphus, PTA Acute Rehabilitation Services Secure Chat Preferred  [phone]    Michaelle Adolphus 03/21/2024, 2:30 PM

## 2024-03-22 ENCOUNTER — Telehealth: Payer: Self-pay | Admitting: Internal Medicine

## 2024-03-22 DIAGNOSIS — Z66 Do not resuscitate: Secondary | ICD-10-CM | POA: Diagnosis not present

## 2024-03-22 DIAGNOSIS — I10 Essential (primary) hypertension: Secondary | ICD-10-CM | POA: Diagnosis not present

## 2024-03-22 DIAGNOSIS — E039 Hypothyroidism, unspecified: Secondary | ICD-10-CM | POA: Diagnosis not present

## 2024-03-22 DIAGNOSIS — Z743 Need for continuous supervision: Secondary | ICD-10-CM | POA: Diagnosis not present

## 2024-03-22 DIAGNOSIS — F03918 Unspecified dementia, unspecified severity, with other behavioral disturbance: Secondary | ICD-10-CM | POA: Diagnosis not present

## 2024-03-22 DIAGNOSIS — W1830XA Fall on same level, unspecified, initial encounter: Secondary | ICD-10-CM | POA: Diagnosis not present

## 2024-03-22 DIAGNOSIS — R531 Weakness: Secondary | ICD-10-CM | POA: Diagnosis not present

## 2024-03-22 DIAGNOSIS — S32591A Other specified fracture of right pubis, initial encounter for closed fracture: Secondary | ICD-10-CM | POA: Diagnosis not present

## 2024-03-22 MED ORDER — QUETIAPINE FUMARATE 25 MG PO TABS: 37.5000 mg | ORAL_TABLET | Freq: Every day | ORAL | Status: AC

## 2024-03-22 MED ORDER — AMLODIPINE BESYLATE 2.5 MG PO TABS
2.5000 mg | ORAL_TABLET | Freq: Every day | ORAL | Status: DC
  Administered 2024-03-22: 2.5 mg via ORAL
  Filled 2024-03-22: qty 1

## 2024-03-22 MED ORDER — ACETAMINOPHEN 500 MG PO TABS: 1000.0000 mg | ORAL_TABLET | Freq: Four times a day (QID) | ORAL | Status: AC

## 2024-03-22 MED ORDER — AMLODIPINE BESYLATE 2.5 MG PO TABS: 2.5000 mg | ORAL_TABLET | Freq: Every day | ORAL | Status: AC

## 2024-03-22 MED ORDER — TRAMADOL HCL 50 MG PO TABS: 50.0000 mg | ORAL_TABLET | Freq: Four times a day (QID) | ORAL | 0 refills | Status: AC | PRN

## 2024-03-22 NOTE — TOC Transition Note (Signed)
 Transition of Care [facility]) - Discharge Note   Patient Details  Name: Darlene Ballard MRN: [id number] Date of Birth: April 06, 1946  [facility] CM/SW Contact:  Elspeth Hals, LCSW Phone Number: 03/22/2024, 11:00 AM   Clinical Narrative:   Pt discharging to [facility].  RN call report to [phone].  PTAR called [phone].      Final next level of care: Skilled Nursing Facility Barriers to Discharge: Barriers Resolved   Patient Goals and CMS Choice   CMS Medicare.gov Compare Post Acute Care list provided to:: Patient Represenative (must comment) (daughter Shelvy Dickens) Choice offered to / list presented to : Adult Children      Discharge Placement              Patient chooses bed at:  [facility]) Patient to be transferred to facility by: ptar Name of family member notified: daughter Shelvy Dickens Patient and family notified of of transfer: 03/22/24  Discharge Plan and Services Additional resources added to the After Visit Summary for   In-house Referral: Clinical Social Work   Post Acute Care Choice: Skilled Nursing Facility                               Social Drivers of Health (SDOH) Interventions SDOH Screenings   Food Insecurity: No Food Insecurity (03/18/2024)  Housing: Low Risk  (03/18/2024)  Transportation Needs: No Transportation Needs (03/18/2024)  Utilities: Not At Risk (03/18/2024)  Alcohol Screen: Low Risk  (05/19/2023)  Depression (PHQ2-9): Low Risk  (05/19/2023)  Financial Resource Strain: Low Risk  (05/19/2023)  Physical Activity: Sufficiently Active (05/19/2023)  Social Connections: Moderately Integrated (03/18/2024)  Stress: No Stress Concern Present (05/19/2023)  Tobacco Use: Low Risk  (03/17/2024)  Health Literacy: Adequate Health Literacy (05/19/2023)     Readmission Risk Interventions    09/15/2023    3:03 PM  Readmission Risk Prevention Plan  Transportation Screening Complete  PCP or Specialist Appt within 5-7 Days Complete  Home  Care Screening Complete  Medication Review (RN CM) Complete

## 2024-03-22 NOTE — Discharge Summary (Signed)
 [facility] Discharge Summary   Patient name: Darlene Ballard  Admit date:     03/17/2024  Discharge date: 03/22/2024  Attending Physician: SEGARS, JONATHAN [[id number]]  Discharge Physician: Unk Garb   PCP: Claudene Crystal, PA-C  Admitted From: Home  Disposition:  [facility]  Recommendations for Outpatient Follow-up:  Follow up with PCP in 1-2 weeks Weight bearing as tolerated. No restrictions to ambulation.  Home Health:No Equipment/Devices: None    Discharge Condition:Stable CODE STATUS:DNR/DNI Diet recommendation: Regular Fluid Restriction: None  Hospital Summary: HPI: Darlene Ballard is a 78 y.o. female with hx of Alzheimer's dementia, hypothyroidism, anxiety, recurrent UTIs, recent admission with stercoral colitis, was brought in from home after a ground-level fall.  History is provided by patient's daughter at the bedside.  Reports that she had a witnessed fall in which she was walking around the [place] in the kitchen and seem to lose her footing and fall on her right side.  Having pain in the right hip with attempted ambulation.  No other injuries noted.  Otherwise recently eating and drinking well, no other recent illness.   Significant Events: Admitted 03/17/2024 for left superior pubic ramus fracture   Admission Labs: WBC 9.8, HgB 12.8, plt 129 Na 139, K 3.0, CO2 of 19, BUN 18, Scr 0.57, glu 108  Admission Imaging Studies: XR pelvis No acute fracture or dislocation. 2. Moderate rectal stool burden, suggesting mild fecal impaction Right Knee XR No fracture or dislocation. 2. Moderate degenerative changes Left femur XR No fracture or dislocation. Mild degenerative change  CT pelvis Nondisplaced left parasymphyseal / superior pubic ramus fracture. 2. Bilateral hips are intact CT head No acute intracranial abnormality. 2. Global cortical atrophy and small vessel ischemic changes CT c-spine No traumatic injury to the cervical spine    Significant Labs:   Significant Imaging Studies:   Antibiotic Therapy: Anti-infectives (From admission, onward)    None       Procedures:   Consultants:    Hospital Course by Problem: * Pubic ramus fracture, right, closed, initial encounter (HCC) 03-18-2024 stable. Start scheduled tylenol . Pt is WBAT. No acute medical indications for inpatient admission.  03-19-2024 still no indications for inpatient admission. Family wanting SNF. CM looking for placement.  03-20-2024 CM looking for SNF placement.  03-21-2024 still awaiting for SNF placement  03-22-2024 still awaiting for SNF placement   Ground-level fall 03-18-2024 stable. Family wants to place patient into SNF. PT/OT consults ordered  03-19-2024 pt only able to walk 10 feet with PT. still no indications for inpatient admission. Family wanting SNF. CM looking for placement.   03-20-2024 CM looking for SNF placement.   03-21-2024 still awaiting for SNF placement  03-22-2024 awaiting SNF placement.  DNR (do not resuscitate) Pt was made DNR/DNI on admission.    Acquired hypothyroidism 03-18-2024 continue synthroid  88 mcg daily.  03-19-2024 stable.  03-20-2024 stable on synthroid   03-21-2024 stable.  03-22-2024 stable.     Dementia with behavioral disturbance (HCC) 03-18-2024 chronic. On seroquel  and prozac   03-19-2024 stable.  03-20-2024 stable on prozac  and seroquel   03-21-2024 stable on prozac /seroquel .  03-22-2024 stable.  Essential hypertension 03-18-2024 stable. Not on any HTN meds.  03-19-2024 stable.  03-20-2024 stable. Not on any HTN meds.  03-21-2024 stable. Would not start HTN meds until SBP in the 160 range to prevent any chance of orthostasis.  03-22-2024 start low dose norasc 2.5 mg daily.  Metabolic acidosis-resolved as of 03/19/2024 03-18-2024 repeat BMP in AM.  03-19-2024 resolved. Serum CO2 of 22 today.  Hypokalemia-resolved as of 03/19/2024 03-18-2024 replete  with po kcl.  03-19-2024 resolved. K of 3.9 today.    Discharge Diagnoses:  Principal Problem:   Pubic ramus fracture, right, closed, initial encounter (HCC) Active Problems:   Ground-level fall   Essential hypertension   Dementia with behavioral disturbance (HCC)   Acquired hypothyroidism   DNR (do not resuscitate)   Discharge Instructions  Discharge Instructions     Call MD for:  difficulty breathing, headache or visual disturbances   Complete by: As directed    Call MD for:  extreme fatigue   Complete by: As directed    Call MD for:  hives   Complete by: As directed    Call MD for:  persistant dizziness or light-headedness   Complete by: As directed    Call MD for:  persistant nausea and vomiting   Complete by: As directed    Call MD for:  redness, tenderness, or signs of infection (pain, swelling, redness, odor or green/yellow discharge around incision site)   Complete by: As directed    Call MD for:  severe uncontrolled pain   Complete by: As directed    Call MD for:  temperature >100.4   Complete by: As directed    Diet general   Complete by: As directed    Discharge instructions   Complete by: As directed    1. Follow up with your primary care provider in 1-2 weeks following discharge from hospital.   Increase activity slowly   Complete by: As directed       Allergies as of 03/22/2024       Reactions   Yellow Jacket Venom Anaphylaxis, Swelling   Paxil [paroxetine] Other (See Comments)   "Anger issues"   Zoloft [sertraline] Other (See Comments)   "Anger issues"        Medication List     TAKE these medications    acetaminophen  500 MG tablet Commonly known as: TYLENOL  Take 2 tablets (1,000 mg total) by mouth 4 (four) times daily for 14 days.   amLODipine 2.5 MG tablet Commonly known as: NORVASC Take 1 tablet (2.5 mg total) by mouth daily.   FLUoxetine  20 MG capsule Commonly known as: PROZAC  Take 1 capsule (20 mg total) by mouth daily.    levothyroxine  88 MCG tablet Commonly known as: SYNTHROID  Take 1 tablet (88 mcg total) by mouth daily.   MELATONIN GUMMIES PO Take 10 mg by mouth at bedtime.   polyethylene glycol 17 g packet Commonly known as: MIRALAX  / GLYCOLAX  Take 17 g by mouth daily. What changed:  when to take this reasons to take this   QUEtiapine  25 MG tablet Commonly known as: SEROquel  Take 1.5 tablets (37.5 mg total) by mouth at bedtime. What changed:  how much to take how to take this when to take this additional instructions   senna 8.6 MG Tabs tablet Commonly known as: SENOKOT Take 1 tablet (8.6 mg total) by mouth at bedtime as needed for mild constipation.        Follow-up Information     Tysinger, Christiane Cowing, PA-C. Schedule an appointment as soon as possible for a visit in 3 days.   Specialty: Family Medicine Contact information: [address] [place] [address] [phone]         Go to  [facility] Emergency Department at [facility].   Specialty: Emergency Medicine Why: As needed, If symptoms worsen Contact information: [address]  [address] [place]  [address] [phone]               Allergies  Allergen Reactions   Yellow Jacket Venom Anaphylaxis and Swelling   Paxil [Paroxetine] Other (See Comments)    "Anger issues"   Zoloft [Sertraline] Other (See Comments)    "Anger issues"    Discharge Exam: Vitals:   03/22/24 0453 03/22/24 0749  BP: (!) 155/88 (!) 153/75  Pulse: 62 66  Resp: 16 18  Temp: 98 F (36.7 C) 97.9 F (36.6 C)  SpO2: 100% 100%    Physical Exam Vitals and nursing note reviewed.  Constitutional:      Comments: Awake and pleasantly demented  HENT:     Head: Normocephalic and atraumatic.  Cardiovascular:     Rate and Rhythm: Normal rate and regular rhythm.  Pulmonary:     Effort: Pulmonary effort is normal.     Breath sounds: Normal breath sounds.  Abdominal:     General: Abdomen is flat. Bowel sounds  are normal.  Skin:    General: Skin is warm and dry.     Capillary Refill: Capillary refill takes less than 2 seconds.  Neurological:     Mental Status: She is disoriented.     The results of significant diagnostics from this hospitalization (including imaging, microbiology, ancillary and laboratory) are listed below for reference.     Labs:  Basic Metabolic Panel: Recent Labs  Lab 03/18/24 0256 03/19/24 0503  NA 139 138  K 3.0* 3.9  CL 105 109  CO2 19* 22  GLUCOSE 108* 92  BUN 18 10  CREATININE 0.57 0.56  CALCIUM 8.5* 8.6*  MG  --  1.9  PHOS  --  3.6   CBC: Recent Labs  Lab 03/18/24 0256  WBC 9.8  NEUTROABS 7.7  HGB 12.8  HCT 36.4  MCV 92.2  PLT 129*   Sepsis Labs Recent Labs  Lab 03/18/24 0256  WBC 9.8    Procedures/Studies: CT Cervical Spine Wo Contrast Result Date: 03/18/2024 EXAM: CT CERVICAL SPINE WITHOUT CONTRAST 03/18/2024 03:21:52 AM TECHNIQUE: CT of the cervical was performed without the administration of intravenous contrast. Multiplanar reformatted images are provided for review. Automated exposure control, iterative reconstruction, and/or weight based adjustment of the mA/kV was utilized to reduce the radiation dose to as low as reasonably achievable. COMPARISON: None available. CLINICAL HISTORY: Neck trauma (Age >= 65y). Triage notes; Pt BIB [facility] from home after a fall. R knee and BIL hip pain. No thinners, No LOC. H/x dementia. FINDINGS: CERVICAL SPINE: BONES/ALIGNMENT: There is no acute fracture or traumatic malalignment. DEGENERATIVE CHANGES: Mild degenerative changes at C5-6 and C6-7. SOFT TISSUES: There is no prevertebral soft tissue swelling. IMPRESSION: 1. No traumatic injury to the cervical spine. Electronically signed by: Zadie Herter MD 03/18/2024 03:27 AM EDT RP Workstation: [id number]   CT Head Wo Contrast Result Date: 03/18/2024 EXAM: CT HEAD WITHOUT CONTRAST 03/18/2024 03:21:52 AM TECHNIQUE: CT of the head was performed without the  administration of intravenous contrast. Automated exposure control, iterative reconstruction, and/or weight based adjustment of the mA/kV was utilized to reduce the radiation dose to as low as reasonably achievable. COMPARISON: 04/15/2023 CLINICAL HISTORY: Head trauma, moderate-severe. Triage notes; Pt BIB [facility] from home after a fall. R knee and BIL hip pain. No thinners, No LOC. H/x dementia. FINDINGS: BRAIN AND VENTRICLES: There is no acute intracranial hemorrhage, mass effect or midline shift. No abnormal extra-axial fluid collection. The gray-white differentiation is maintained without an acute  infarct. There is no hydrocephalus. Global cortical atrophy. Subcortical and periventricular small vessel ischemic changes. ORBITS: The visualized portion of the orbits demonstrate no acute abnormality. SINUSES: The visualized paranasal sinuses and mastoid air cells demonstrate no acute abnormality. SOFT TISSUES AND SKULL: No acute abnormality of the visualized skull or soft tissues. IMPRESSION: 1. No acute intracranial abnormality. 2. Global cortical atrophy and small vessel ischemic changes. Electronically signed by: Zadie Herter MD 03/18/2024 03:26 AM EDT RP Workstation: [id number]   CT PELVIS WO CONTRAST Result Date: 03/18/2024 EXAM: CT Pelvis, WITHOUT IV CONTRAST 03/18/2024 03:21:52 AM TECHNIQUE: Axial images were acquired through the pelvis without IV contrast. Reformatted images were reviewed. Automated exposure control, iterative reconstruction, and/or weight based adjustment of the mA/kV was utilized to reduce the radiation dose to as low as reasonably achievable. COMPARISON: 02/19/2024 CLINICAL HISTORY: Hip trauma, fracture suspected, xray done. Triage notes; Pt BIB [facility] from home after a fall. R knee and BIL hip pain. No thinners, No LOC. H/x dementia. FINDINGS: BONES: Nondisplaced left parasymphyseal/superior pubic ramus fracture (coronal image 82). Bilateral hips are intact. JOINTS: No dislocation.  The joint spaces are normal. SOFT TISSUES: The soft tissues are unremarkable. INTRAPELVIC CONTENTS: Thick-walled bladder, although underdistended. Moderate colonic stool burden. IMPRESSION: 1. Nondisplaced left parasymphyseal / superior pubic ramus fracture. 2. Bilateral hips are intact. Electronically signed by: Zadie Herter MD 03/18/2024 03:26 AM EDT RP Workstation: [id number]   DG Knee Complete 4 Views Left Result Date: 03/18/2024 CLINICAL DATA:  Fall, pain EXAM: LEFT KNEE - COMPLETE 4+ VIEW COMPARISON:  None Available. FINDINGS: Normal alignment. No acute fracture or dislocation. Mild to moderate tricompartmental degenerative arthritis, most severe within the lateral compartment. Trace effusion. Soft tissues are otherwise unremarkable. IMPRESSION: 1. Mild to moderate tricompartmental degenerative arthritis. Electronically Signed   By: Worthy Heads M.D.   On: 03/18/2024 02:46   DG Femur Min 2 Views Left Result Date: 03/18/2024 CLINICAL DATA:  Fall, left hip pain EXAM: LEFT FEMUR 2 VIEWS COMPARISON:  None Available. FINDINGS: There is no evidence of fracture or other focal bone lesions. Mild tricompartmental left knee degenerative arthritis. Mild left hip degenerative arthritis. Soft tissues are unremarkable. IMPRESSION: 1. No fracture or dislocation. Mild degenerative change. Electronically Signed   By: Worthy Heads M.D.   On: 03/18/2024 02:45   DG Hip Unilat W or Wo Pelvis 2-3 Views Left Result Date: 03/18/2024 CLINICAL DATA:  Fall, hip pain EXAM: DG HIP (WITH OR WITHOUT PELVIS) 2-3V LEFT COMPARISON:  None Available. FINDINGS: Normal alignment. No acute fracture or dislocation. Mild bilateral degenerative hip arthritis. Soft tissues are unremarkable. IMPRESSION: 1. Mild bilateral degenerative hip arthritis. Electronically Signed   By: Worthy Heads M.D.   On: 03/18/2024 02:44   DG Knee Complete 4 Views Right Result Date: 03/17/2024 EXAM: 4 or more VIEW(S) XRAY OF THE RIGHT KNEE 03/17/2024  10:33:24 PM COMPARISON: None available. CLINICAL HISTORY: R hip pain. Encounter for right hip pain from fall. FINDINGS: BONES AND JOINTS: No acute fracture. No focal osseous lesion. No joint dislocation. No significant joint effusion. Moderate tricompartmental degenerative changes most prominent in the lateral compartment. SOFT TISSUES: The soft tissues are unremarkable. IMPRESSION: 1. No fracture or dislocation. 2. Moderate degenerative changes. Electronically signed by: Zadie Herter MD 03/17/2024 10:40 PM EDT RP Workstation: [id number]   DG Hip Unilat W or Wo Pelvis 2-3 Views Right Result Date: 03/17/2024 EXAM: 3 VIEW(S) XRAY OF THE RIGHT HIP 03/17/2024 10:31:25 PM COMPARISON: None available. CLINICAL HISTORY: R hip pain. Encounter for right  hip pain, fall FINDINGS: BONES AND JOINTS: No acute fracture or focal osseous lesion. The hip joint is maintained. No significant degenerative changes. SOFT TISSUES: Calcifications overlying the right lower abdomen are presumably external to the patient when correlating with priors. Moderate rectal stool burden, suggesting mild fecal impaction. IMPRESSION: 1. No acute fracture or dislocation. 2. Moderate rectal stool burden, suggesting mild fecal impaction. Electronically signed by: Zadie Herter MD 03/17/2024 10:39 PM EDT RP Workstation: [id number]    Time coordinating discharge: 55 mins  SIGNED:  Unk Garb, DO [facility] 03/22/24, 9:59 AM

## 2024-03-22 NOTE — Telephone Encounter (Signed)
 Received message from CM that pt having pain at SNF. Sent Rx for ultram 50-100 mg q6h prn pain.

## 2024-03-22 NOTE — TOC Progression Note (Addendum)
 Transition of Care [facility]) - Progression Note    Patient Details  Name: Darlene Ballard MRN: [id number] Date of Birth: 1946-09-10  [facility] CM/SW Contact  Elspeth Hals, LCSW Phone Number: 03/22/2024, 8:42 AM  Clinical Narrative:  PASSR received: [id number] E   0930: SNF auth approved: 7 days: [id number], PTAR approved: [id number].  MD aware.   CSW confirmed with [facility] that they can receive pt today.   Expected Discharge Plan: Skilled Nursing Facility Barriers to Discharge: SNF Pending bed offer  Expected Discharge Plan and Services In-house Referral: Clinical Social Work   Post Acute Care Choice: Skilled Nursing Facility Living arrangements for the past 2 months: Single Family Home                                       Social Determinants of Health (SDOH) Interventions SDOH Screenings   Food Insecurity: No Food Insecurity (03/18/2024)  Housing: Low Risk  (03/18/2024)  Transportation Needs: No Transportation Needs (03/18/2024)  Utilities: Not At Risk (03/18/2024)  Alcohol Screen: Low Risk  (05/19/2023)  Depression (PHQ2-9): Low Risk  (05/19/2023)  Financial Resource Strain: Low Risk  (05/19/2023)  Physical Activity: Sufficiently Active (05/19/2023)  Social Connections: Moderately Integrated (03/18/2024)  Stress: No Stress Concern Present (05/19/2023)  Tobacco Use: Low Risk  (03/17/2024)  Health Literacy: Adequate Health Literacy (05/19/2023)    Readmission Risk Interventions    09/15/2023    3:03 PM  Readmission Risk Prevention Plan  Transportation Screening Complete  PCP or Specialist Appt within 5-7 Days Complete  Home Care Screening Complete  Medication Review (RN CM) Complete

## 2024-03-22 NOTE — Progress Notes (Signed)
 PROGRESS NOTE    DAKOTA STANGL  UUV:[id number] DOB: 06/09/1946 DOA: 03/17/2024 PCP: Claudene Crystal, PA-C  Subjective: Pt seen and examined.  Pleasantly demented.  Walked 30 feet with PT yesterday Awaiting clearance for DC by CM. Pt has no acute medical reason to remain in the hospital.   Hospital Course: HPI: JENISIS HARMSEN is a 78 y.o. female with hx of Alzheimer's dementia, hypothyroidism, anxiety, recurrent UTIs, recent admission with stercoral colitis, was brought in from home after a ground-level fall.  History is provided by patient's daughter at the bedside.  Reports that she had a witnessed fall in which she was walking around [place] in the kitchen and seem to lose her footing and fall on her right side.  Having pain in the right hip with attempted ambulation.  No other injuries noted.  Otherwise recently eating and drinking well, no other recent illness.   Significant Events: Admitted 03/17/2024 for left superior pubic ramus fracture   Admission Labs: WBC 9.8, HgB 12.8, plt 129 Na 139, K 3.0, CO2 of 19, BUN 18, Scr 0.57, glu 108  Admission Imaging Studies: XR pelvis No acute fracture or dislocation. 2. Moderate rectal stool burden, suggesting mild fecal impaction Right Knee XR No fracture or dislocation. 2. Moderate degenerative changes Left femur XR No fracture or dislocation. Mild degenerative change  CT pelvis Nondisplaced left parasymphyseal / superior pubic ramus fracture. 2. Bilateral hips are intact CT head No acute intracranial abnormality. 2. Global cortical atrophy and small vessel ischemic changes CT c-spine No traumatic injury to the cervical spine   Significant Labs:   Significant Imaging Studies:   Antibiotic Therapy: Anti-infectives (From admission, onward)    None       Procedures:   Consultants:     Assessment and Plan: * Pubic ramus fracture, right, closed, initial encounter (HCC) 03-18-2024 stable. Start scheduled tylenol .  Pt is WBAT. No acute medical indications for inpatient admission.  03-19-2024 still no indications for inpatient admission. Family wanting SNF. CM looking for placement.  03-20-2024 CM looking for SNF placement.  03-21-2024 still awaiting for SNF placement  03-22-2024 still awaiting for SNF placement   Ground-level fall 03-18-2024 stable. Family wants to place patient into SNF. PT/OT consults ordered  03-19-2024 pt only able to walk 10 feet with PT. still no indications for inpatient admission. Family wanting SNF. CM looking for placement.   03-20-2024 CM looking for SNF placement.   03-21-2024 still awaiting for SNF placement  03-22-2024 awaiting SNF placement.  DNR (do not resuscitate) Pt was made DNR/DNI on admission.  Acquired hypothyroidism 03-18-2024 continue synthroid  88 mcg daily.  03-19-2024 stable.  03-20-2024 stable on synthroid   03-21-2024 stable.  03-22-2024 stable.     Dementia with behavioral disturbance (HCC) 03-18-2024 chronic. On seroquel  and prozac   03-19-2024 stable.  03-20-2024 stable on prozac  and seroquel   03-21-2024 stable on prozac /seroquel .  03-22-2024 stable.  Essential hypertension 03-18-2024 stable. Not on any HTN meds.  03-19-2024 stable.  03-20-2024 stable. Not on any HTN meds.  03-21-2024 stable. Would not start HTN meds until SBP in the 160 range to prevent any chance of orthostasis.  03-22-2024 start low dose norasc 2.5 mg daily.  Metabolic acidosis-resolved as of 03/19/2024 03-18-2024 repeat BMP in AM.  03-19-2024 resolved. Serum CO2 of 22 today.  Hypokalemia-resolved as of 03/19/2024 03-18-2024 replete with po kcl.  03-19-2024 resolved. K of 3.9 today.  DVT prophylaxis: enoxaparin  (LOVENOX ) injection 40 mg Start: 03/18/24 1000    Code  Status: Limited: Do not attempt resuscitation (DNR) -DNR-LIMITED -Do Not Intubate/DNI  Family Communication: no family at bedside Disposition Plan: SNF Reason for continuing need  for hospitalization: medically stable for DC.  Objective: Vitals:   03/21/24 0441 03/21/24 0738 03/22/24 0453 03/22/24 0749  BP: 133/74 (!) 141/80 (!) 155/88 (!) 153/75  Pulse: 65 68 62 66  Resp: 16  16 18   Temp: 98.2 F (36.8 C) 98 F (36.7 C) 98 F (36.7 C) 97.9 F (36.6 C)  TempSrc: Oral  Oral Oral  SpO2: 100% 100% 100% 100%  Weight:      Height:        Intake/Output Summary (Last 24 hours) at 03/22/2024 0925 Last data filed at 03/22/2024 0456 Gross per 24 hour  Intake --  Output 800 ml  Net -800 ml   Filed Weights   03/17/24 2054  Weight: 50.3 kg    Examination:  Physical Exam Vitals and nursing note reviewed.  Constitutional:      Comments: Awake and pleasantly demented  HENT:     Head: Normocephalic and atraumatic.  Cardiovascular:     Rate and Rhythm: Normal rate and regular rhythm.  Pulmonary:     Effort: Pulmonary effort is normal.     Breath sounds: Normal breath sounds.  Abdominal:     General: Abdomen is flat. Bowel sounds are normal.  Skin:    General: Skin is warm and dry.     Capillary Refill: Capillary refill takes less than 2 seconds.  Neurological:     Mental Status: She is disoriented.     Data Reviewed: I have personally reviewed following labs and imaging studies  CBC: Recent Labs  Lab 03/18/24 0256  WBC 9.8  NEUTROABS 7.7  HGB 12.8  HCT 36.4  MCV 92.2  PLT 129*   Basic Metabolic Panel: Recent Labs  Lab 03/18/24 0256 03/19/24 0503  NA 139 138  K 3.0* 3.9  CL 105 109  CO2 19* 22  GLUCOSE 108* 92  BUN 18 10  CREATININE 0.57 0.56  CALCIUM 8.5* 8.6*  MG  --  1.9  PHOS  --  3.6   GFR: Estimated Creatinine Clearance: 43 mL/min (by C-G formula based on SCr of 0.56 mg/dL).  Sepsis Labs: Recent Labs  Lab 03/18/24 0424  LATICACIDVEN 1.3   Scheduled Meds:  acetaminophen   1,000 mg Oral QID   amLODipine  2.5 mg Oral Daily   enoxaparin  (LOVENOX ) injection  40 mg Subcutaneous Q24H   FLUoxetine   20 mg Oral Daily    levothyroxine   88 mcg Oral Daily   melatonin  6 mg Oral QHS   polyethylene glycol  17 g Oral Once per day on Monday Wednesday Friday   QUEtiapine   37.5 mg Oral QHS   Continuous Infusions:   LOS: 0 days   Time spent: 50 minutes  Unk Garb, DO  [facility]  03/22/2024, 9:25 AM

## 2024-03-22 NOTE — Progress Notes (Signed)
 Patient ID: Darlene Ballard, female   DOB: Dec 25, 1945, 78 y.o.   MRN: [id number]  Report called to [facility] and given to June LPN. Ptar to transfer.  Genella Kendall, RN

## 2024-03-22 NOTE — Plan of Care (Signed)
 Patient ID: Darlene Ballard, female   DOB: 04-Jun-1946, 78 y.o.   MRN: [id number]  Problem: Education: Goal: Knowledge of General Education information will improve Description: Including pain rating scale, medication(s)/side effects and non-pharmacologic comfort measures Outcome: Adequate for Discharge   Problem: Health Behavior/Discharge Planning: Goal: Ability to manage health-related needs will improve Outcome: Adequate for Discharge   Problem: Clinical Measurements: Goal: Ability to maintain clinical measurements within normal limits will improve Outcome: Adequate for Discharge Goal: Will remain free from infection Outcome: Adequate for Discharge Goal: Diagnostic test results will improve Outcome: Adequate for Discharge Goal: Respiratory complications will improve Outcome: Adequate for Discharge Goal: Cardiovascular complication will be avoided Outcome: Adequate for Discharge   Problem: Activity: Goal: Risk for activity intolerance will decrease Outcome: Adequate for Discharge   Problem: Nutrition: Goal: Adequate nutrition will be maintained Outcome: Adequate for Discharge   Problem: Coping: Goal: Level of anxiety will decrease Outcome: Adequate for Discharge   Problem: Elimination: Goal: Will not experience complications related to bowel motility Outcome: Adequate for Discharge Goal: Will not experience complications related to urinary retention Outcome: Adequate for Discharge   Problem: Pain Managment: Goal: General experience of comfort will improve and/or be controlled Outcome: Adequate for Discharge   Problem: Safety: Goal: Ability to remain free from injury will improve Outcome: Adequate for Discharge   Problem: Skin Integrity: Goal: Risk for impaired skin integrity will decrease Outcome: Adequate for Discharge   Problem: Acute Rehab OT Goals (only OT should resolve) Goal: Pt. Will Perform Eating Outcome: Adequate for Discharge Goal: Pt. Will Perform  Upper Body Dressing Outcome: Adequate for Discharge Goal: Pt. Will Perform Lower Body Dressing Outcome: Adequate for Discharge Goal: Pt. Will Transfer To Toilet Outcome: Adequate for Discharge Goal: Pt. Will Perform Toileting-Clothing Manipulation Outcome: Adequate for Discharge   Problem: Acute Rehab PT Goals(only PT should resolve) Goal: Pt Will Go Supine/Side To Sit Outcome: Adequate for Discharge Goal: Patient Will Transfer Sit To/From Stand Outcome: Adequate for Discharge Goal: Pt Will Transfer Bed To Chair/Chair To Bed Outcome: Adequate for Discharge Goal: Pt Will Ambulate Outcome: Adequate for Discharge Goal: Pt/caregiver will Perform Home Exercise Program Outcome: Adequate for Discharge  Genella Kendall, RN

## 2024-03-23 ENCOUNTER — Telehealth: Payer: Self-pay

## 2024-03-23 NOTE — Telephone Encounter (Signed)
 Pt in rehab, Daughter will call to schedule F/U

## 2024-03-25 DIAGNOSIS — M6281 Muscle weakness (generalized): Secondary | ICD-10-CM | POA: Diagnosis not present

## 2024-03-25 DIAGNOSIS — R2689 Other abnormalities of gait and mobility: Secondary | ICD-10-CM | POA: Diagnosis not present

## 2024-03-25 DIAGNOSIS — R296 Repeated falls: Secondary | ICD-10-CM | POA: Diagnosis not present

## 2024-03-25 DIAGNOSIS — R269 Unspecified abnormalities of gait and mobility: Secondary | ICD-10-CM | POA: Diagnosis not present

## 2024-03-25 DIAGNOSIS — S32591D Other specified fracture of right pubis, subsequent encounter for fracture with routine healing: Secondary | ICD-10-CM | POA: Diagnosis not present

## 2024-03-28 DIAGNOSIS — R52 Pain, unspecified: Secondary | ICD-10-CM | POA: Diagnosis not present

## 2024-03-28 DIAGNOSIS — G309 Alzheimer's disease, unspecified: Secondary | ICD-10-CM | POA: Diagnosis not present

## 2024-03-28 DIAGNOSIS — S32591D Other specified fracture of right pubis, subsequent encounter for fracture with routine healing: Secondary | ICD-10-CM | POA: Diagnosis not present

## 2024-03-28 DIAGNOSIS — Z79899 Other long term (current) drug therapy: Secondary | ICD-10-CM | POA: Diagnosis not present

## 2024-03-28 DIAGNOSIS — Z9181 History of falling: Secondary | ICD-10-CM | POA: Diagnosis not present

## 2024-03-28 DIAGNOSIS — Z7189 Other specified counseling: Secondary | ICD-10-CM | POA: Diagnosis not present

## 2024-03-29 DIAGNOSIS — F419 Anxiety disorder, unspecified: Secondary | ICD-10-CM | POA: Diagnosis not present

## 2024-03-29 DIAGNOSIS — E559 Vitamin D deficiency, unspecified: Secondary | ICD-10-CM | POA: Diagnosis not present

## 2024-03-29 DIAGNOSIS — Z9181 History of falling: Secondary | ICD-10-CM | POA: Diagnosis not present

## 2024-03-29 DIAGNOSIS — S32591D Other specified fracture of right pubis, subsequent encounter for fracture with routine healing: Secondary | ICD-10-CM | POA: Diagnosis not present

## 2024-03-29 DIAGNOSIS — Z79899 Other long term (current) drug therapy: Secondary | ICD-10-CM | POA: Diagnosis not present

## 2024-03-29 DIAGNOSIS — F03918 Unspecified dementia, unspecified severity, with other behavioral disturbance: Secondary | ICD-10-CM | POA: Diagnosis not present

## 2024-03-30 DIAGNOSIS — Z9181 History of falling: Secondary | ICD-10-CM | POA: Diagnosis not present

## 2024-03-30 DIAGNOSIS — I10 Essential (primary) hypertension: Secondary | ICD-10-CM | POA: Diagnosis not present

## 2024-03-30 DIAGNOSIS — E559 Vitamin D deficiency, unspecified: Secondary | ICD-10-CM | POA: Diagnosis not present

## 2024-03-30 DIAGNOSIS — S32591D Other specified fracture of right pubis, subsequent encounter for fracture with routine healing: Secondary | ICD-10-CM | POA: Diagnosis not present

## 2024-03-30 DIAGNOSIS — F03918 Unspecified dementia, unspecified severity, with other behavioral disturbance: Secondary | ICD-10-CM | POA: Diagnosis not present

## 2024-03-30 DIAGNOSIS — E039 Hypothyroidism, unspecified: Secondary | ICD-10-CM | POA: Diagnosis not present

## 2024-03-30 DIAGNOSIS — F419 Anxiety disorder, unspecified: Secondary | ICD-10-CM | POA: Diagnosis not present

## 2024-03-31 DIAGNOSIS — I10 Essential (primary) hypertension: Secondary | ICD-10-CM | POA: Diagnosis not present

## 2024-03-31 DIAGNOSIS — E039 Hypothyroidism, unspecified: Secondary | ICD-10-CM | POA: Diagnosis not present

## 2024-03-31 DIAGNOSIS — M1990 Unspecified osteoarthritis, unspecified site: Secondary | ICD-10-CM | POA: Diagnosis not present

## 2024-03-31 DIAGNOSIS — R52 Pain, unspecified: Secondary | ICD-10-CM | POA: Diagnosis not present

## 2024-03-31 DIAGNOSIS — Z79899 Other long term (current) drug therapy: Secondary | ICD-10-CM | POA: Diagnosis not present

## 2024-04-12 DIAGNOSIS — F411 Generalized anxiety disorder: Secondary | ICD-10-CM | POA: Diagnosis not present

## 2024-04-15 DIAGNOSIS — F02B3 Dementia in other diseases classified elsewhere, moderate, with mood disturbance: Secondary | ICD-10-CM | POA: Diagnosis not present

## 2024-04-15 DIAGNOSIS — G301 Alzheimer's disease with late onset: Secondary | ICD-10-CM | POA: Diagnosis not present

## 2024-04-15 DIAGNOSIS — F411 Generalized anxiety disorder: Secondary | ICD-10-CM | POA: Diagnosis not present

## 2024-05-04 DIAGNOSIS — I10 Essential (primary) hypertension: Secondary | ICD-10-CM | POA: Diagnosis not present

## 2024-05-04 DIAGNOSIS — F03918 Unspecified dementia, unspecified severity, with other behavioral disturbance: Secondary | ICD-10-CM | POA: Diagnosis not present

## 2024-05-04 DIAGNOSIS — Z79899 Other long term (current) drug therapy: Secondary | ICD-10-CM | POA: Diagnosis not present

## 2024-05-04 DIAGNOSIS — E559 Vitamin D deficiency, unspecified: Secondary | ICD-10-CM | POA: Diagnosis not present

## 2024-05-04 DIAGNOSIS — M1990 Unspecified osteoarthritis, unspecified site: Secondary | ICD-10-CM | POA: Diagnosis not present

## 2024-05-04 DIAGNOSIS — E039 Hypothyroidism, unspecified: Secondary | ICD-10-CM | POA: Diagnosis not present

## 2024-05-04 DIAGNOSIS — S32591D Other specified fracture of right pubis, subsequent encounter for fracture with routine healing: Secondary | ICD-10-CM | POA: Diagnosis not present

## 2024-05-04 DIAGNOSIS — F419 Anxiety disorder, unspecified: Secondary | ICD-10-CM | POA: Diagnosis not present

## 2024-05-08 DIAGNOSIS — M25561 Pain in right knee: Secondary | ICD-10-CM | POA: Diagnosis not present

## 2024-05-08 DIAGNOSIS — M25562 Pain in left knee: Secondary | ICD-10-CM | POA: Diagnosis not present

## 2024-05-16 DIAGNOSIS — M79652 Pain in left thigh: Secondary | ICD-10-CM | POA: Diagnosis not present

## 2024-05-16 DIAGNOSIS — M25552 Pain in left hip: Secondary | ICD-10-CM | POA: Diagnosis not present

## 2024-05-16 DIAGNOSIS — M79605 Pain in left leg: Secondary | ICD-10-CM | POA: Diagnosis not present

## 2024-05-16 DIAGNOSIS — M25562 Pain in left knee: Secondary | ICD-10-CM | POA: Diagnosis not present

## 2024-05-17 DIAGNOSIS — R600 Localized edema: Secondary | ICD-10-CM | POA: Diagnosis not present

## 2024-05-17 DIAGNOSIS — Z79899 Other long term (current) drug therapy: Secondary | ICD-10-CM | POA: Diagnosis not present

## 2024-05-17 DIAGNOSIS — R52 Pain, unspecified: Secondary | ICD-10-CM | POA: Diagnosis not present

## 2024-05-17 DIAGNOSIS — M1990 Unspecified osteoarthritis, unspecified site: Secondary | ICD-10-CM | POA: Diagnosis not present

## 2024-05-17 DIAGNOSIS — G309 Alzheimer's disease, unspecified: Secondary | ICD-10-CM | POA: Diagnosis not present

## 2024-05-17 DIAGNOSIS — I82492 Acute embolism and thrombosis of other specified deep vein of left lower extremity: Secondary | ICD-10-CM | POA: Diagnosis not present

## 2024-05-19 DIAGNOSIS — R52 Pain, unspecified: Secondary | ICD-10-CM | POA: Diagnosis not present

## 2024-05-19 DIAGNOSIS — Z79899 Other long term (current) drug therapy: Secondary | ICD-10-CM | POA: Diagnosis not present

## 2024-05-19 DIAGNOSIS — M1990 Unspecified osteoarthritis, unspecified site: Secondary | ICD-10-CM | POA: Diagnosis not present

## 2024-05-24 DIAGNOSIS — I10 Essential (primary) hypertension: Secondary | ICD-10-CM | POA: Diagnosis not present

## 2024-05-24 DIAGNOSIS — Z79899 Other long term (current) drug therapy: Secondary | ICD-10-CM | POA: Diagnosis not present

## 2024-05-24 DIAGNOSIS — E039 Hypothyroidism, unspecified: Secondary | ICD-10-CM | POA: Diagnosis not present

## 2024-05-24 DIAGNOSIS — F419 Anxiety disorder, unspecified: Secondary | ICD-10-CM | POA: Diagnosis not present

## 2024-05-24 DIAGNOSIS — G309 Alzheimer's disease, unspecified: Secondary | ICD-10-CM | POA: Diagnosis not present

## 2024-05-27 ENCOUNTER — Other Ambulatory Visit: Payer: Self-pay | Admitting: Orthopedic Surgery

## 2024-05-27 DIAGNOSIS — M170 Bilateral primary osteoarthritis of knee: Secondary | ICD-10-CM

## 2024-05-27 DIAGNOSIS — M1712 Unilateral primary osteoarthritis, left knee: Secondary | ICD-10-CM | POA: Diagnosis not present

## 2024-05-27 DIAGNOSIS — M1711 Unilateral primary osteoarthritis, right knee: Secondary | ICD-10-CM | POA: Diagnosis not present

## 2024-05-31 ENCOUNTER — Ambulatory Visit (HOSPITAL_COMMUNITY)
Admission: RE | Admit: 2024-05-31 | Discharge: 2024-05-31 | Disposition: A | Discharge status: Home / Self Care | Source: Ambulatory Visit | Attending: Orthopedic Surgery | Admitting: Orthopedic Surgery

## 2024-05-31 DIAGNOSIS — M25462 Effusion, left knee: Secondary | ICD-10-CM | POA: Diagnosis not present

## 2024-05-31 DIAGNOSIS — M170 Bilateral primary osteoarthritis of knee: Secondary | ICD-10-CM | POA: Diagnosis not present

## 2024-05-31 DIAGNOSIS — M85862 Other specified disorders of bone density and structure, left lower leg: Secondary | ICD-10-CM | POA: Diagnosis not present

## 2024-06-10 DIAGNOSIS — G301 Alzheimer's disease with late onset: Secondary | ICD-10-CM | POA: Diagnosis not present

## 2024-06-10 DIAGNOSIS — F411 Generalized anxiety disorder: Secondary | ICD-10-CM | POA: Diagnosis not present

## 2024-06-10 DIAGNOSIS — F02B3 Dementia in other diseases classified elsewhere, moderate, with mood disturbance: Secondary | ICD-10-CM | POA: Diagnosis not present

## 2024-06-20 DIAGNOSIS — Z79899 Other long term (current) drug therapy: Secondary | ICD-10-CM | POA: Diagnosis not present

## 2024-06-20 DIAGNOSIS — F419 Anxiety disorder, unspecified: Secondary | ICD-10-CM | POA: Diagnosis not present

## 2024-06-20 DIAGNOSIS — I10 Essential (primary) hypertension: Secondary | ICD-10-CM | POA: Diagnosis not present

## 2024-06-20 DIAGNOSIS — G309 Alzheimer's disease, unspecified: Secondary | ICD-10-CM | POA: Diagnosis not present

## 2024-07-12 DIAGNOSIS — M179 Osteoarthritis of knee, unspecified: Secondary | ICD-10-CM | POA: Diagnosis not present

## 2024-07-12 DIAGNOSIS — R52 Pain, unspecified: Secondary | ICD-10-CM | POA: Diagnosis not present

## 2024-07-12 DIAGNOSIS — Z79899 Other long term (current) drug therapy: Secondary | ICD-10-CM | POA: Diagnosis not present

## 2024-07-15 DIAGNOSIS — M25562 Pain in left knee: Secondary | ICD-10-CM | POA: Diagnosis not present

## 2024-07-15 DIAGNOSIS — S83522A Sprain of posterior cruciate ligament of left knee, initial encounter: Secondary | ICD-10-CM | POA: Diagnosis not present

## 2024-07-15 DIAGNOSIS — M1712 Unilateral primary osteoarthritis, left knee: Secondary | ICD-10-CM | POA: Diagnosis not present

## 2024-07-20 DIAGNOSIS — E039 Hypothyroidism, unspecified: Secondary | ICD-10-CM | POA: Diagnosis not present

## 2024-07-20 DIAGNOSIS — M1711 Unilateral primary osteoarthritis, right knee: Secondary | ICD-10-CM | POA: Diagnosis not present

## 2024-07-20 DIAGNOSIS — G309 Alzheimer's disease, unspecified: Secondary | ICD-10-CM | POA: Diagnosis not present

## 2024-07-20 DIAGNOSIS — I10 Essential (primary) hypertension: Secondary | ICD-10-CM | POA: Diagnosis not present

## 2024-07-20 DIAGNOSIS — M179 Osteoarthritis of knee, unspecified: Secondary | ICD-10-CM | POA: Diagnosis not present

## 2024-07-20 DIAGNOSIS — T148XXA Other injury of unspecified body region, initial encounter: Secondary | ICD-10-CM | POA: Diagnosis not present

## 2024-07-20 DIAGNOSIS — Z9181 History of falling: Secondary | ICD-10-CM | POA: Diagnosis not present

## 2024-07-22 DIAGNOSIS — G301 Alzheimer's disease with late onset: Secondary | ICD-10-CM | POA: Diagnosis not present

## 2024-07-22 DIAGNOSIS — F02B3 Dementia in other diseases classified elsewhere, moderate, with mood disturbance: Secondary | ICD-10-CM | POA: Diagnosis not present

## 2024-07-22 DIAGNOSIS — F411 Generalized anxiety disorder: Secondary | ICD-10-CM | POA: Diagnosis not present

## 2024-07-28 DIAGNOSIS — M25562 Pain in left knee: Secondary | ICD-10-CM | POA: Diagnosis not present

## 2024-08-02 DIAGNOSIS — M25562 Pain in left knee: Secondary | ICD-10-CM | POA: Diagnosis not present

## 2024-08-02 DIAGNOSIS — T148XXA Other injury of unspecified body region, initial encounter: Secondary | ICD-10-CM | POA: Diagnosis not present

## 2024-08-16 DIAGNOSIS — Z79899 Other long term (current) drug therapy: Secondary | ICD-10-CM | POA: Diagnosis not present

## 2024-08-16 DIAGNOSIS — R52 Pain, unspecified: Secondary | ICD-10-CM | POA: Diagnosis not present

## 2024-08-16 DIAGNOSIS — M179 Osteoarthritis of knee, unspecified: Secondary | ICD-10-CM | POA: Diagnosis not present

## 2024-09-12 DIAGNOSIS — M160 Bilateral primary osteoarthritis of hip: Secondary | ICD-10-CM | POA: Diagnosis not present

## 2024-09-12 DIAGNOSIS — R52 Pain, unspecified: Secondary | ICD-10-CM | POA: Diagnosis not present

## 2024-09-12 DIAGNOSIS — Z79899 Other long term (current) drug therapy: Secondary | ICD-10-CM | POA: Diagnosis not present

## 2024-09-12 DIAGNOSIS — M179 Osteoarthritis of knee, unspecified: Secondary | ICD-10-CM | POA: Diagnosis not present

## 2024-09-19 ENCOUNTER — Ambulatory Visit: Admitting: Physician Assistant
